# Patient Record
Sex: Female | Born: 2000
Health system: Southern US, Community
[De-identification: ages and names within clinical notes are randomized; demographics above are authoritative.]

## PROBLEM LIST (undated history)

## (undated) DIAGNOSIS — F32A Depression, unspecified: Secondary | ICD-10-CM

## (undated) DIAGNOSIS — M5126 Other intervertebral disc displacement, lumbar region: Secondary | ICD-10-CM

## (undated) DIAGNOSIS — J45909 Unspecified asthma, uncomplicated: Secondary | ICD-10-CM

## (undated) DIAGNOSIS — F329 Major depressive disorder, single episode, unspecified: Secondary | ICD-10-CM

## (undated) DIAGNOSIS — F209 Schizophrenia, unspecified: Secondary | ICD-10-CM

## (undated) DIAGNOSIS — A35 Other tetanus: Secondary | ICD-10-CM

## (undated) HISTORY — DX: Other tetanus: A35

## (undated) HISTORY — DX: Major depressive disorder, single episode, unspecified: F32.9

## (undated) HISTORY — DX: Depression, unspecified: F32.A

---
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
## 2018-02-05 ENCOUNTER — Encounter: Payer: Self-pay | Admitting: Emergency Medicine

## 2018-02-05 ENCOUNTER — Other Ambulatory Visit: Payer: Self-pay

## 2018-02-05 ENCOUNTER — Emergency Department
Admission: EM | Admit: 2018-02-05 | Discharge: 2018-02-05 | Disposition: A | Discharge status: Home / Self Care | Payer: BLUE CROSS/BLUE SHIELD | Attending: Emergency Medicine | Admitting: Emergency Medicine

## 2018-02-05 DIAGNOSIS — M5442 Lumbago with sciatica, left side: Secondary | ICD-10-CM | POA: Diagnosis not present

## 2018-02-05 DIAGNOSIS — M545 Low back pain: Secondary | ICD-10-CM | POA: Diagnosis not present

## 2018-02-05 DIAGNOSIS — J45909 Unspecified asthma, uncomplicated: Secondary | ICD-10-CM | POA: Diagnosis not present

## 2018-02-05 DIAGNOSIS — M5440 Lumbago with sciatica, unspecified side: Secondary | ICD-10-CM | POA: Diagnosis not present

## 2018-02-05 DIAGNOSIS — M5441 Lumbago with sciatica, right side: Secondary | ICD-10-CM | POA: Diagnosis not present

## 2018-02-05 HISTORY — DX: Other intervertebral disc displacement, lumbar region: M51.26

## 2018-02-05 LAB — URINALYSIS, COMPLETE (UACMP) WITH MICROSCOPIC
Bilirubin Urine: NEGATIVE
Glucose, UA: NEGATIVE mg/dL
Hgb urine dipstick: NEGATIVE
Ketones, ur: NEGATIVE mg/dL
Nitrite: NEGATIVE
Protein, ur: NEGATIVE mg/dL
Specific Gravity, Urine: 1.025 (ref 1.005–1.030)
pH: 6 (ref 5.0–8.0)

## 2018-02-05 MED ORDER — CYCLOBENZAPRINE HCL 10 MG PO TABS
5.0000 mg | ORAL_TABLET | Freq: Once | ORAL | Status: AC
  Administered 2018-02-05: 5 mg via ORAL
  Filled 2018-02-05: qty 1

## 2018-02-05 MED ORDER — PREDNISONE 50 MG PO TABS: ORAL_TABLET | ORAL | 0 refills | Status: DC

## 2018-02-05 MED ORDER — KETOROLAC TROMETHAMINE 30 MG/ML IJ SOLN
30.0000 mg | Freq: Once | INTRAMUSCULAR | Status: AC
  Administered 2018-02-05: 30 mg via INTRAMUSCULAR
  Filled 2018-02-05: qty 1

## 2018-02-05 MED ORDER — CYCLOBENZAPRINE HCL 5 MG PO TABS: 5.0000 mg | ORAL_TABLET | Freq: Three times a day (TID) | ORAL | 0 refills | Status: AC | PRN

## 2018-02-05 NOTE — ED Triage Notes (Addendum)
Pt presents to ED with lower back pain that has worsened over the past several weeks. Worse with movement and lifting. Hx of the same. Herniated L4 and L5. Ambulatory with steady gait. No distress noted.

## 2018-02-05 NOTE — ED Notes (Signed)

## 2018-02-05 NOTE — ED Provider Notes (Signed)
[facility] Emergency Department Provider Note  ____________________________________________  Time seen: Approximately 10:42 PM  I have reviewed the triage vital signs and the nursing notes.   HISTORY  Chief Complaint Back Pain    HPI Jill Osborne is a 17 y.o. female presents to the emergency department with bilateral low back pain that radiates into the buttocks but not the lower extremities.  Patient reports that she has experienced symptoms for the past 2 weeks.  She denies dysuria, hematuria or increased urinary frequency.  No fever or chills.  Patient reports that she is currently menstruating and denies the possibility of pregnancy.  She denies falls or mechanisms of trauma.  Patient does report that she has been carrying a backpack which is heavier than usual.  Patient reports a history of a herniated disc diagnosed approximately 6 years ago at L4-L5.  No weakness, radiculopathy or changes in sensation of the lower extremities.  No saddle anesthesia or bowel or bladder incontinence.  No alleviating measures have been attempted.  Past Medical History:  Diagnosis Date  . Asthma   . Lumbar herniated disc     Patient Active Problem List   Diagnosis Date Noted  . H/O appendicitis 07/15/2016  . Acute appendicitis with localized peritonitis     Past Surgical History:  Procedure Laterality Date  . LAPAROSCOPIC APPENDECTOMY N/A 07/15/2016   Procedure: APPENDECTOMY LAPAROSCOPIC;  Surgeon: Ricarda Frameharles Woodham, MD;  Location: [facility] ORS;  Service: General;  Laterality: N/A;    Prior to Admission medications   Medication Sig Start Date End Date Taking? Authorizing Provider  albuterol (PROVENTIL HFA;VENTOLIN HFA) 108 (90 Base) MCG/ACT inhaler Inhale 2 puffs into the lungs every 4 (four) hours as needed for wheezing or shortness of breath.    [provider]  cyclobenzaprine (FLEXERIL) 5 MG tablet Take 1 tablet (5 mg total) by mouth 3 (three) times daily as  needed for up to 3 days for muscle spasms. 02/05/18 02/08/18  Orvil FeilWoods, Shiara Mcgough M, PA-C  predniSONE (DELTASONE) 50 MG tablet Take one 50 mg tablet once daily for the next 5 days. 02/05/18   Orvil FeilWoods, Kirsten Spearing M, PA-C    Allergies Shellfish allergy  Family History  Problem Relation Age of Onset  . Healthy Mother   . Healthy Father     Social History Social History   Tobacco Use  . Smoking status: Never Smoker  . Smokeless tobacco: Never Used  Substance Use Topics  . Alcohol use: No  . Drug use: No     Review of Systems  Constitutional: No fever/chills Eyes: No visual changes. No discharge ENT: No upper respiratory complaints. Cardiovascular: no chest pain. Respiratory: no cough. No SOB. Gastrointestinal: No abdominal pain.  No nausea, no vomiting.  No diarrhea.  No constipation. Genitourinary: Negative for dysuria. No hematuria Musculoskeletal: Patient has low back pain.  Skin: Negative for rash, abrasions, lacerations, ecchymosis. Neurological: Negative for headaches, focal weakness or numbness.  ____________________________________________   PHYSICAL EXAM:  VITAL SIGNS: ED Triage Vitals  Enc Vitals Group     BP 02/05/18 1948 111/67     Pulse Rate 02/05/18 1948 62     Resp 02/05/18 1948 18     Temp 02/05/18 1948 98.8 F (37.1 C)     Temp Source 02/05/18 1948 Oral     SpO2 02/05/18 1948 100 %     Weight 02/05/18 1949 145 lb (65.8 kg)     Height 02/05/18 1949 5' (1.524 m)     Head  Circumference --      Peak Flow --      Pain Score 02/05/18 1949 5     Pain Loc --      Pain Edu? --      Excl. in GC? --      Constitutional: Alert and oriented. Well appearing and in no acute distress. Eyes: Conjunctivae are normal. PERRL. EOMI. Head: Atraumatic. Cardiovascular: Normal rate, regular rhythm. Normal S1 and S2.  Good peripheral circulation. Respiratory: Normal respiratory effort without tachypnea or retractions. Lungs CTAB. Good air entry to the bases with no decreased or  absent breath sounds. Gastrointestinal: Bowel sounds 4 quadrants. Soft and nontender to palpation. No guarding or rigidity. No palpable masses. No distention. No CVA tenderness. Musculoskeletal: Full range of motion to all extremities. No gross deformities appreciated.  Patient has paraspinal muscle tenderness along the lumbar spine.  Negative straight leg raise bilaterally. Neurologic:  Normal speech and language. No gross focal neurologic deficits are appreciated.  Skin:  Skin is warm, dry and intact. No rash noted. Psychiatric: Mood and affect are normal. Speech and behavior are normal. Patient exhibits appropriate insight and judgement.   ____________________________________________   LABS (all labs ordered are listed, but only abnormal results are displayed)  Labs Reviewed  URINALYSIS, COMPLETE (UACMP) WITH MICROSCOPIC - Abnormal; Notable for the following components:      Result Value   Color, Urine YELLOW (*)    APPearance CLOUDY (*)    Leukocytes, UA TRACE (*)    Bacteria, UA RARE (*)    Squamous Epithelial / LPF 6-30 (*)    All other components within normal limits   ____________________________________________  EKG   ____________________________________________  RADIOLOGY   No results found.  ____________________________________________    PROCEDURES  Procedure(s) performed:    Procedures    Medications  ketorolac (TORADOL) 30 MG/ML injection 30 mg (30 mg Intramuscular Given 02/05/18 2128)  cyclobenzaprine (FLEXERIL) tablet 5 mg (5 mg Oral Given 02/05/18 2130)     ____________________________________________   INITIAL IMPRESSION / ASSESSMENT AND PLAN / ED COURSE  Pertinent labs & imaging results that were available during my care of the patient were reviewed by me and considered in my medical decision making (see chart for details).  Review of the [place] CSRS was performed in accordance of the [facility] prior to dispensing any controlled drugs.      Assessment and plan Low back pain Patient presents to the emergency department with low back pain for the past 2 weeks.  Differential diagnosis included cystitis, lumbar strain and sciatica.  Patient was treated empirically with prednisone and Flexeril given radiation of pain into the buttocks and failure of anti-inflammatories.  Patient was advised to try a [place] backpack.  Vital signs are reassuring prior to discharge.  All patient questions were answered.    ____________________________________________  FINAL CLINICAL IMPRESSION(S) / ED DIAGNOSES  Final diagnoses:  Acute bilateral low back pain with sciatica, sciatica laterality unspecified      NEW MEDICATIONS STARTED DURING THIS VISIT:  ED Discharge Orders        Ordered    predniSONE (DELTASONE) 50 MG tablet     02/05/18 2119    cyclobenzaprine (FLEXERIL) 5 MG tablet  3 times daily PRN     02/05/18 2121          This chart was dictated using voice recognition software/Dragon. Despite best efforts to proofread, errors can occur which can change the meaning. Any change was purely unintentional.  Pia Mau [place], PA-C 02/05/18 2247    Dionne Bucy, MD 02/05/18 2322

## 2018-02-05 NOTE — ED Notes (Signed)
Pt reports upper and lower back pain that started 2 weeks ago however has increased since that time. Pt denies inj or trauma to back. Pt states hx of L4 and L5 back herniation. Pt states when she turns the pain gets worse. Pt denies loss of bladder or bowel funx. Pt ambulatory to room.

## 2018-03-07 ENCOUNTER — Encounter: Payer: Self-pay | Admitting: Primary Care

## 2018-03-07 ENCOUNTER — Ambulatory Visit: Payer: BLUE CROSS/BLUE SHIELD | Admitting: Primary Care

## 2018-03-07 VITALS — BP 108/66 | HR 73 | Temp 98.1°F | Ht 60.75 in | Wt 140.8 lb

## 2018-03-07 DIAGNOSIS — Z0000 Encounter for general adult medical examination without abnormal findings: Secondary | ICD-10-CM | POA: Diagnosis not present

## 2018-03-07 NOTE — Patient Instructions (Signed)
Start exercising. You should be getting 150 minutes of moderate intensity exercise weekly.  It's important to improve your diet by reducing consumption of fast food, fried food, processed snack foods, sugary drinks. Increase consumption of fresh vegetables and fruits, whole grains, water.  Ensure you are drinking 64 ounces of water daily.  Follow up in 1 year for your annual exam or sooner if needed.  It was a pleasure to meet you today! Please don't hesitate to call or message me with any questions. Welcome to [facility]!   Preventive Care 18-39 Years, Female Preventive care refers to lifestyle choices and visits with your health care provider that can promote health and wellness. What does preventive care include?  A yearly physical exam. This is also called an annual well check.  Dental exams once or twice a year.  Routine eye exams. Ask your health care provider how often you should have your eyes checked.  Personal lifestyle choices, including: ? Daily care of your teeth and gums. ? Regular physical activity. ? Eating a healthy diet. ? Avoiding tobacco and drug use. ? Limiting alcohol use. ? Practicing safe sex. ? Taking vitamin and mineral supplements as recommended by your health care provider. What happens during an annual well check? The services and screenings done by your health care provider during your annual well check will depend on your age, overall health, lifestyle risk factors, and family history of disease. Counseling Your health care provider may ask you questions about your:  Alcohol use.  Tobacco use.  Drug use.  Emotional well-being.  Home and relationship well-being.  Sexual activity.  Eating habits.  Work and work Statistician.  Method of birth control.  Menstrual cycle.  Pregnancy history.  Screening You may have the following tests or measurements:  Height, weight, and BMI.  Diabetes screening. This is done by checking your blood  sugar (glucose) after you have not eaten for a while (fasting).  Blood pressure.  Lipid and cholesterol levels. These may be checked every 5 years starting at age 54.  Skin check.  Hepatitis C blood test.  Hepatitis B blood test.  Sexually transmitted disease (STD) testing.  BRCA-related cancer screening. This may be done if you have a family history of breast, ovarian, tubal, or peritoneal cancers.  Pelvic exam and Pap test. This may be done every 3 years starting at age 75. Starting at age 74, this may be done every 5 years if you have a Pap test in combination with an HPV test.  Discuss your test results, treatment options, and if necessary, the need for more tests with your health care provider. Vaccines Your health care provider may recommend certain vaccines, such as:  Influenza vaccine. This is recommended every year.  Tetanus, diphtheria, and acellular pertussis (Tdap, Td) vaccine. You may need a Td booster every 10 years.  Varicella vaccine. You may need this if you have not been vaccinated.  HPV vaccine. If you are 77 or younger, you may need three doses over 6 months.  Measles, mumps, and rubella (MMR) vaccine. You may need at least one dose of MMR. You may also need a second dose.  Pneumococcal 13-valent conjugate (PCV13) vaccine. You may need this if you have certain conditions and were not previously vaccinated.  Pneumococcal polysaccharide (PPSV23) vaccine. You may need one or two doses if you smoke cigarettes or if you have certain conditions.  Meningococcal vaccine. One dose is recommended if you are age 61-21 years and a first-year college  student living in a residence hall, or if you have one of several medical conditions. You may also need additional booster doses.  Hepatitis A vaccine. You may need this if you have certain conditions or if you travel or work in places where you may be exposed to hepatitis A.  Hepatitis B vaccine. You may need this if you  have certain conditions or if you travel or work in places where you may be exposed to hepatitis B.  Haemophilus influenzae type b (Hib) vaccine. You may need this if you have certain risk factors.  Talk to your health care provider about which screenings and vaccines you need and how often you need them. This information is not intended to replace advice given to you by your health care provider. Make sure you discuss any questions you have with your health care provider. Document Released: 11/29/2001 Document Revised: 06/22/2016 Document Reviewed: 08/04/2015 Elsevier Interactive Patient Education  [employer].

## 2018-03-07 NOTE — Progress Notes (Signed)
Subjective:    Patient ID: Jill Osborne, female    DOB: Apr 01, 2001, 17 y.o.   MRN: [id number]  HPI  Jill Osborne is an 17 year old female who presents today to establish care and for complete physical.   1) Depression: History of in the past and was seeing a therapist for several years. She denies concerns of depression since 2017.   Immunizations: -Tetanus: Completed in 2012 -Influenza: Did not complete last season -HPV: Completed three series     Diet: She endorses a fair diet. Breakfast: Egg sandwich, fruit Lunch: Left overs  Dinner: Vegetables, meat, starch Snacks: Crackers, cheese Desserts: None Beverages: Water, juice  Exercise: She is not exercising  Eye exam: Completed in 2019 Dental exam: Completes annually   Review of Systems  Constitutional: Negative for unexpected weight change.  HENT: Negative for rhinorrhea.   Respiratory: Negative for cough and shortness of breath.   Cardiovascular: Negative for chest pain.  Gastrointestinal: Negative for constipation and diarrhea.  Genitourinary: Negative for difficulty urinating and menstrual problem.  Musculoskeletal: Negative for arthralgias and myalgias.  Skin: Negative for rash.  Allergic/Immunologic: Negative for environmental allergies.  Neurological: Negative for dizziness, numbness and headaches.  Psychiatric/Behavioral: The patient is not nervous/anxious.        Denies concerns for anxiety and depression       Past Medical History:  Diagnosis Date  . Asthma   . Depression   . Lumbar herniated disc      Social History   Socioeconomic History  . Marital status: Single    Spouse name: Not on file  . Number of children: Not on file  . Years of education: Not on file  . Highest education level: Not on file  Occupational History  . Not on file  Social Needs  . Financial resource strain: Not on file  . Food insecurity:    Worry: Not on file    Inability: Not on file  . Transportation needs:   Medical: Not on file    Non-medical: Not on file  Tobacco Use  . Smoking status: Never Smoker  . Smokeless tobacco: Never Used  Substance and Sexual Activity  . Alcohol use: No  . Drug use: No  . Sexual activity: Not on file  Lifestyle  . Physical activity:    Days per week: Not on file    Minutes per session: Not on file  . Stress: Not on file  Relationships  . Social connections:    Talks on phone: Not on file    Gets together: Not on file    Attends religious service: Not on file    Active member of club or organization: Not on file    Attends meetings of clubs or organizations: Not on file    Relationship status: Not on file  . Intimate partner violence:    Fear of current or ex partner: Not on file    Emotionally abused: Not on file    Physically abused: Not on file    Forced sexual activity: Not on file  Other Topics Concern  . Not on file  Social History Narrative   Single.   Student at [employer], [employer].   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   Enjoys reading, spending time with friends.    Past Surgical History:  Procedure Laterality Date  . LAPAROSCOPIC APPENDECTOMY N/A 07/15/2016   Procedure: APPENDECTOMY LAPAROSCOPIC;  Surgeon: Ricarda Frame, MD;  Location: [facility] ORS;  Service: General;  Laterality: N/A;  Family History  Problem Relation Age of Onset  . Healthy Mother   . Healthy Father     Allergies  Allergen Reactions  . Shellfish Allergy Hives    No current outpatient medications on file prior to visit.   No current facility-administered medications on file prior to visit.     BP 108/66   Pulse 73   Temp 98.1 F (36.7 C) (Oral)   Ht 5' 0.75" (1.543 m)   Wt 140 lb 12 oz (63.8 kg)   LMP 02/06/2018   SpO2 98%   BMI 26.81 kg/m    Objective:   Physical Exam  Constitutional: She is oriented to person, place, and time. She appears well-nourished.  HENT:  Right Ear: Tympanic membrane and ear canal normal.  Left Ear: Tympanic membrane and  ear canal normal.  Nose: Nose normal.  Mouth/Throat: Oropharynx is clear and moist.  Eyes: Pupils are equal, round, and reactive to light. Conjunctivae and EOM are normal.  Neck: Neck supple. No thyromegaly present.  Cardiovascular: Normal rate and regular rhythm.  No murmur heard. Pulmonary/Chest: Effort normal and breath sounds normal. She has no rales.  Abdominal: Soft. Bowel sounds are normal. There is no tenderness.  Musculoskeletal: Normal range of motion.  Lymphadenopathy:    She has no cervical adenopathy.  Neurological: She is alert and oriented to person, place, and time. She has normal reflexes. No cranial nerve deficit.  Skin: Skin is warm and dry. No rash noted.  Psychiatric: She has a normal mood and affect.          Assessment & Plan:

## 2018-03-07 NOTE — Assessment & Plan Note (Signed)
Immunizations UTD. Recommended regular exercise, increase consumption of vegetables, fruit, whole grains, water. Exam unremarkable. Declines lab work today, this is fine. Follow up in 1 year for CPE.

## 2018-04-09 ENCOUNTER — Encounter: Payer: Self-pay | Admitting: Internal Medicine

## 2018-04-09 ENCOUNTER — Ambulatory Visit (INDEPENDENT_AMBULATORY_CARE_PROVIDER_SITE_OTHER): Payer: BLUE CROSS/BLUE SHIELD | Admitting: Internal Medicine

## 2018-04-09 VITALS — BP 106/70 | HR 72 | Temp 98.3°F | Wt 143.0 lb

## 2018-04-09 DIAGNOSIS — G5601 Carpal tunnel syndrome, right upper limb: Secondary | ICD-10-CM

## 2018-04-09 MED ORDER — NAPROXEN 375 MG PO TABS: 375.0000 mg | ORAL_TABLET | Freq: Two times a day (BID) | ORAL | 0 refills | Status: DC | PRN

## 2018-04-09 NOTE — Patient Instructions (Signed)
Carpal Tunnel Syndrome Carpal tunnel syndrome is a condition that causes pain in your hand and arm. The carpal tunnel is a narrow area that is on the palm side of your wrist. Repeated wrist motion or certain diseases may cause swelling in the tunnel. This swelling can pinch the main nerve in the wrist (median nerve). Follow these instructions at home: If you have a splint:  Wear it as told by your doctor. Remove it only as told by your doctor.  Loosen the splint if your fingers: ? Become numb and tingle. ? Turn blue and cold.  Keep the splint clean and dry. General instructions  Take over-the-counter and prescription medicines only as told by your doctor.  Rest your wrist from any activity that may be causing your pain. If needed, talk to your employer about changes that can be made in your work, such as getting a wrist pad to use while typing.  If directed, apply ice to the painful area: ? Put ice in a plastic bag. ? Place a towel between your skin and the bag. ? Leave the ice on for 20 minutes, 2-3 times per day.  Keep all follow-up visits as told by your doctor. This is important.  Do any exercises as told by your doctor, physical therapist, or occupational therapist. Contact a doctor if:  You have new symptoms.  Medicine does not help your pain.  Your symptoms get worse. This information is not intended to replace advice given to you by your health care provider. Make sure you discuss any questions you have with your health care provider. Document Released: 09/22/2011 Document Revised: 03/10/2016 Document Reviewed: 02/18/2015 Elsevier Interactive Patient Education  2018 Elsevier Inc.  

## 2018-04-09 NOTE — Progress Notes (Signed)
Subjective:    Patient ID: Jill Osborne, female    DOB: [birth year], 17 y.o.   MRN: [id number]  HPI  Pt presents to the clinic today with c/o right wrist and hand pain. She reports this started 3 weeks ago. She describes the pain as dull and achy. She has some numbness in her right thumb and index finger, but denies tingling or weakness. She denies any injury to the area but reports she is constantly cutting boxes open with a box cutter at work. She has tried ice, Tylenol and Ibuprofen with some relief.   Review of Systems      Past Medical History:  Diagnosis Date  . Asthma   . Depression   . Lumbar herniated disc     No current outpatient medications on file.   No current facility-administered medications for this visit.     Allergies  Allergen Reactions  . Shellfish Allergy Hives    Family History  Problem Relation Age of Onset  . Healthy Mother   . Healthy Father     Social History   Socioeconomic History  . Marital status: Single    Spouse name: Not on file  . Number of children: Not on file  . Years of education: Not on file  . Highest education level: Not on file  Occupational History  . Not on file  Social Needs  . Financial resource strain: Not on file  . Food insecurity:    Worry: Not on file    Inability: Not on file  . Transportation needs:    Medical: Not on file    Non-medical: Not on file  Tobacco Use  . Smoking status: Never Smoker  . Smokeless tobacco: Never Used  Substance and Sexual Activity  . Alcohol use: No  . Drug use: No  . Sexual activity: Not on file  Lifestyle  . Physical activity:    Days per week: Not on file    Minutes per session: Not on file  . Stress: Not on file  Relationships  . Social connections:    Talks on phone: Not on file    Gets together: Not on file    Attends religious service: Not on file    Active member of club or organization: Not on file    Attends meetings of clubs or organizations: Not on  file    Relationship status: Not on file  . Intimate partner violence:    Fear of current or ex partner: Not on file    Emotionally abused: Not on file    Physically abused: Not on file    Forced sexual activity: Not on file  Other Topics Concern  . Not on file  Social History Narrative   Single.   Student [employer].   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   Enjoys reading, spending time with friends.     Constitutional: Denies fever, malaise, fatigue, headache or abrupt weight changes.  Musculoskeletal: Pt reports right wrist and hand pain. Denies decrease in range of motion, difficulty with gait, muscle pain or joint swelling.  Skin: Denies redness, rashes, lesions or ulcercations.  Neurological: Pt reports numbness in right thumb. Denies dizziness, difficulty with memory, difficulty with speech or problems with balance and coordination.    No other specific complaints in a complete review of systems (except as listed in HPI above).  Objective:   Physical Exam  Wt 143 lb (64.9 kg)   LMP 03/14/2018   BMI  27.24 kg/m  Wt Readings from Last 3 Encounters:  04/09/18 143 lb (64.9 kg) (76 %, Z= 0.70)*  03/07/18 140 lb 12 oz (63.8 kg) (74 %, Z= 0.63)*  02/05/18 145 lb (65.8 kg) (78 %, Z= 0.79)*   * Growth percentiles are based on CDC (Girls, 2-20 Years) data.    General: Appears her stated age, well developed, well nourished in NAD. Skin: Warm, dry and intact. No rashes, lesions or ulcerations noted. Cardiovascular: Radial pulse 2+ bilaterally. Cap refill < 3 secs. Musculoskeletal: Normal flexion, extension and rotation of the right wrist. No joint swelling noted. No pain with palpation over the carpals. Pain with palpation over the right CMC. Hand grips equal. Neurological: Alert and oriented. Sensation intact to right thumb and index finger. Positive Tinel's and Phalen's.   BMET    Component Value Date/Time   NA 140 08/14/2017 2021   K 3.7 08/14/2017 2021   CL 109  08/14/2017 2021   CO2 24 08/14/2017 2021   GLUCOSE 112 (H) 08/14/2017 2021   BUN 8 08/14/2017 2021   CREATININE 0.70 08/14/2017 2021   CALCIUM 9.6 08/14/2017 2021   GFRNONAA >60 08/14/2017 2021   GFRAA >60 08/14/2017 2021    Lipid Panel  No results found for: CHOL, TRIG, HDL, CHOLHDL, VLDL, LDLCALC  CBC    Component Value Date/Time   WBC 6.4 08/14/2017 2021   RBC 3.93 08/14/2017 2021   HGB 11.7 (L) 08/14/2017 2021   HCT 35.6 (L) 08/14/2017 2021   PLT 289 08/14/2017 2021   MCV 90.6 08/14/2017 2021   MCH 29.8 08/14/2017 2021   MCHC 32.9 08/14/2017 2021   RDW 12.7 08/14/2017 2021    Hgb A1C No results found for: HGBA1C          Assessment & Plan:   Carpal Tunnel Syndrome:  eRx for Naproxen 375 mg BID prn- take with food, avoid other NSAID use Advised her to get a cock up splint to wear while working If no improvement, consider referral to neurology for EMG testing vs referral to hand surgery for surgical evaluation  Return precautions discussed Nicki Reaper, NP

## 2018-04-10 ENCOUNTER — Ambulatory Visit (INDEPENDENT_AMBULATORY_CARE_PROVIDER_SITE_OTHER): Payer: BLUE CROSS/BLUE SHIELD | Admitting: Family Medicine

## 2018-04-10 ENCOUNTER — Encounter: Payer: Self-pay | Admitting: Family Medicine

## 2018-04-10 VITALS — BP 106/67 | HR 75 | Ht 60.0 in | Wt 143.4 lb

## 2018-04-10 DIAGNOSIS — Z113 Encounter for screening for infections with a predominantly sexual mode of transmission: Secondary | ICD-10-CM | POA: Diagnosis not present

## 2018-04-10 DIAGNOSIS — Z3009 Encounter for other general counseling and advice on contraception: Secondary | ICD-10-CM | POA: Diagnosis not present

## 2018-04-10 DIAGNOSIS — Z01419 Encounter for gynecological examination (general) (routine) without abnormal findings: Secondary | ICD-10-CM

## 2018-04-10 NOTE — Patient Instructions (Addendum)
Preventive Care 18-39 Years, Female Preventive care refers to lifestyle choices and visits with your health care provider that can promote health and wellness. What does preventive care include?  A yearly physical exam. This is also called an annual well check.  Dental exams once or twice a year.  Routine eye exams. Ask your health care provider how often you should have your eyes checked.  Personal lifestyle choices, including: ? Daily care of your teeth and gums. ? Regular physical activity. ? Eating a healthy diet. ? Avoiding tobacco and drug use. ? Limiting alcohol use. ? Practicing safe sex. ? Taking vitamin and mineral supplements as recommended by your health care provider. What happens during an annual well check? The services and screenings done by your health care provider during your annual well check will depend on your age, overall health, lifestyle risk factors, and family history of disease. Counseling Your health care provider may ask you questions about your:  Alcohol use.  Tobacco use.  Drug use.  Emotional well-being.  Home and relationship well-being.  Sexual activity.  Eating habits.  Work and work Statistician.  Method of birth control.  Menstrual cycle.  Pregnancy history.  Screening You may have the following tests or measurements:  Height, weight, and BMI.  Diabetes screening. This is done by checking your blood sugar (glucose) after you have not eaten for a while (fasting).  Blood pressure.  Lipid and cholesterol levels. These may be checked every 5 years starting at age 38.  Skin check.  Hepatitis C blood test.  Hepatitis B blood test.  Sexually transmitted disease (STD) testing.  BRCA-related cancer screening. This may be done if you have a family history of breast, ovarian, tubal, or peritoneal cancers.  Pelvic exam and Pap test. This may be done every 3 years starting at age 38. Starting at age 30, this may be done  every 5 years if you have a Pap test in combination with an HPV test.  Discuss your test results, treatment options, and if necessary, the need for more tests with your health care provider. Vaccines Your health care provider may recommend certain vaccines, such as:  Influenza vaccine. This is recommended every year.  Tetanus, diphtheria, and acellular pertussis (Tdap, Td) vaccine. You may need a Td booster every 10 years.  Varicella vaccine. You may need this if you have not been vaccinated.  HPV vaccine. If you are 39 or younger, you may need three doses over 6 months.  Measles, mumps, and rubella (MMR) vaccine. You may need at least one dose of MMR. You may also need a second dose.  Pneumococcal 13-valent conjugate (PCV13) vaccine. You may need this if you have certain conditions and were not previously vaccinated.  Pneumococcal polysaccharide (PPSV23) vaccine. You may need one or two doses if you smoke cigarettes or if you have certain conditions.  Meningococcal vaccine. One dose is recommended if you are age 68-21 years and a first-year college student living in a residence hall, or if you have one of several medical conditions. You may also need additional booster doses.  Hepatitis A vaccine. You may need this if you have certain conditions or if you travel or work in places where you may be exposed to hepatitis A.  Hepatitis B vaccine. You may need this if you have certain conditions or if you travel or work in places where you may be exposed to hepatitis B.  Haemophilus influenzae type b (Hib) vaccine. You may need this  if you have certain risk factors.  Talk to your health care provider about which screenings and vaccines you need and how often you need them. This information is not intended to replace advice given to you by your health care provider. Make sure you discuss any questions you have with your health care provider. Document Released: 11/29/2001 Document Revised:  06/22/2016 Document Reviewed: 08/04/2015 Elsevier Interactive Patient Education  2018 [employer].  Intrauterine Device Information An intrauterine device (IUD) is inserted into your uterus to prevent pregnancy. There are two types of IUDs available:  Copper IUD-This type of IUD is wrapped in copper wire and is placed inside the uterus. Copper makes the uterus and fallopian tubes produce a fluid that kills sperm. The copper IUD can stay in place for 10 years.  Hormone IUD-This type of IUD contains the hormone progestin (synthetic progesterone). The hormone thickens the cervical mucus and prevents sperm from entering the uterus. It also thins the uterine lining to prevent implantation of a fertilized egg. The hormone can weaken or kill the sperm that get into the uterus. One type of hormone IUD can stay in place for 5 years, and another type can stay in place for 3 years.  Your health care provider will make sure you are a good candidate for a contraceptive IUD. Discuss with your health care provider the possible side effects. Advantages of an intrauterine device  IUDs are highly effective, reversible, long acting, and low maintenance.  There are no estrogen-related side effects.  An IUD can be used when breastfeeding.  IUDs are not associated with weight gain.  The copper IUD works immediately after insertion.  The hormone IUD works right away if inserted within 7 days of your period starting. You will need to use a backup method of birth control for 7 days if the hormone IUD is inserted at any other time in your cycle.  The copper IUD does not interfere with your female hormones.  The hormone IUD can make heavy menstrual periods lighter and decrease cramping.  The hormone IUD can be used for 3 or 5 years.  The copper IUD can be used for 10 years. Disadvantages of an intrauterine device  The hormone IUD can be associated with irregular bleeding patterns.  The copper IUD can make  your menstrual flow heavier and more painful.  You may experience cramping and vaginal bleeding after insertion. This information is not intended to replace advice given to you by your health care provider. Make sure you discuss any questions you have with your health care provider. Document Released: 09/06/2004 Document Revised: 03/10/2016 Document Reviewed: 03/24/2013 Elsevier Interactive Patient Education  2017 [employer].

## 2018-04-10 NOTE — Progress Notes (Signed)
Pt presents for AEX. Pt would like to discuss birth control options.

## 2018-04-10 NOTE — Progress Notes (Signed)
  Subjective:     Jill Osborne is a 17 y.o. female and is here for a comprehensive physical exam. The patient reports no problems. LMP 5/29, regular cycles. Using condoms for contraception.  Past Medical History:  Diagnosis Date  . Asthma   . Depression   . Lumbar herniated disc    Past Surgical History:  Procedure Laterality Date  . LAPAROSCOPIC APPENDECTOMY N/A 07/15/2016   Procedure: APPENDECTOMY LAPAROSCOPIC;  Surgeon: Ricarda Frameharles Woodham, MD;  Location: [facility] ORS;  Service: General;  Laterality: N/A;   Allergies  Allergen Reactions  . Shellfish Allergy Hives    Health Maintenance  Topic Date Due  . CHLAMYDIA SCREENING  05/28/2014  . HIV Screening  05/28/2014  . INFLUENZA VACCINE  05/17/2018    The following portions of the patient's history were reviewed and updated as appropriate: allergies, current medications, past family history, past medical history, past social history, past surgical history and problem list.  Review of Systems Pertinent items noted in HPI and remainder of comprehensive ROS otherwise negative.   Objective:    BP 106/67   Pulse 75   Ht 5' (1.524 m)   Wt 143 lb 6.4 oz (65 kg)   LMP 03/14/2018   BMI 28.01 kg/m  General appearance: alert, cooperative and appears stated age Head: Normocephalic, without obvious abnormality, atraumatic Neck: supple, symmetrical, trachea midline Lungs: clear to auscultation bilaterally Heart: regular rate and rhythm, S1, S2 normal, no murmur, click, rub or gallop Abdomen: soft, non-tender; bowel sounds normal; no masses,  no organomegaly Extremities: extremities normal, atraumatic, no cyanosis or edema Skin: Skin color, texture, turgor normal. No rashes or lesions Neurologic: Grossly normal    Assessment:    Healthy female exam.      Plan:     Screen for STD (sexually transmitted disease) - Plan: Cervicovaginal ancillary only  Encounter for counseling regarding contraception - decided for IUD--consider  Kyleena vs. Liletta--check cultures today, return with menses, take Ibuprofen, Condoms always.  Return in 1 week (on 04/17/2018) for IUD insertion.  See After Visit Summary for Counseling Recommendations

## 2018-04-11 DIAGNOSIS — Z1281 Encounter for screening for malignant neoplasm of oral cavity: Secondary | ICD-10-CM | POA: Diagnosis not present

## 2018-04-11 DIAGNOSIS — K0500 Acute gingivitis, plaque induced: Secondary | ICD-10-CM | POA: Diagnosis not present

## 2018-04-11 DIAGNOSIS — K036 Deposits [accretions] on teeth: Secondary | ICD-10-CM | POA: Diagnosis not present

## 2018-04-11 LAB — CERVICOVAGINAL ANCILLARY ONLY
Chlamydia: NEGATIVE
NEISSERIA GONORRHEA: NEGATIVE

## 2018-04-26 ENCOUNTER — Ambulatory Visit (INDEPENDENT_AMBULATORY_CARE_PROVIDER_SITE_OTHER): Payer: BLUE CROSS/BLUE SHIELD | Admitting: Family Medicine

## 2018-04-26 ENCOUNTER — Encounter: Payer: Self-pay | Admitting: Family Medicine

## 2018-04-26 VITALS — BP 106/72 | HR 72 | Wt 144.2 lb

## 2018-04-26 DIAGNOSIS — Z3202 Encounter for pregnancy test, result negative: Secondary | ICD-10-CM

## 2018-04-26 DIAGNOSIS — Z30430 Encounter for insertion of intrauterine contraceptive device: Secondary | ICD-10-CM | POA: Diagnosis not present

## 2018-04-26 LAB — POCT URINE PREGNANCY: Preg Test, Ur: NEGATIVE

## 2018-04-26 MED ORDER — LEVONORGESTREL 19.5 MG IU IUD
1.0000 | INTRAUTERINE_SYSTEM | INTRAUTERINE | Status: DC
  Administered 2018-04-26: 19.5 mg via INTRAUTERINE

## 2018-04-26 NOTE — Patient Instructions (Signed)

## 2018-04-26 NOTE — Progress Notes (Signed)
   Subjective:    Patient ID: Jill AngstMariah E Dorian is a 17 y.o. female presenting with IUD INSERTION  on 04/26/2018  HPI: Here today for IUD insertion. She is without complaints. Negative STD testing 2 wks ago. She is on her cycle and took some Ibuprofen today. UPT is negative.  Review of Systems  Constitutional: Negative for chills and fever.  Respiratory: Negative for shortness of breath.   Cardiovascular: Negative for chest pain.  Gastrointestinal: Negative for abdominal pain, nausea and vomiting.  Genitourinary: Negative for dysuria.  Skin: Negative for rash.      Objective:    BP 106/72   Pulse 72   Wt 144 lb 3.2 oz (65.4 kg)   BMI 28.16 kg/m  Physical Exam  Constitutional: She is oriented to person, place, and time. She appears well-developed and well-nourished. No distress.  HENT:  Head: Normocephalic and atraumatic.  Eyes: No scleral icterus.  Neck: Neck supple.  Cardiovascular: Normal rate.  Pulmonary/Chest: Effort normal.  Abdominal: Soft.  Genitourinary: Vagina normal. Cervix exhibits no motion tenderness.  Neurological: She is alert and oriented to person, place, and time.  Skin: Skin is warm and dry.  Psychiatric: She has a normal mood and affect.   Procedure: Patient identified, informed consent performed, signed copy in chart, time out was performed.  Urine pregnancy test negative.  Speculum placed in the vagina.  Cervix visualized.  Cleaned with Betadine x 2.  Grasped anteriourly with a single tooth tenaculum.  Uterus sounded to 7 cm.  Kyleena IUD placed per manufacturer's recommendations.  Strings trimmed to 3 cm.   Patient given post procedure instructions and Kyleena care card with expiration date.  Patient is asked to check IUD strings periodically and follow up in 4-6 weeks for IUD check.      Assessment & Plan:  Encounter for IUD insertion - Kyleena placed - Plan: POCT urine pregnancy, Levonorgestrel IUD 19.5 mg  Return in about 1 month (around  05/24/2018) for iud check.  Reva Boresanya S Orpheus [place] 04/26/2018 10:02 AM

## 2018-05-06 ENCOUNTER — Other Ambulatory Visit: Payer: Self-pay | Admitting: Internal Medicine

## 2018-05-31 ENCOUNTER — Encounter: Payer: Self-pay | Admitting: Family Medicine

## 2018-05-31 ENCOUNTER — Ambulatory Visit (INDEPENDENT_AMBULATORY_CARE_PROVIDER_SITE_OTHER): Payer: BLUE CROSS/BLUE SHIELD | Admitting: Family Medicine

## 2018-05-31 VITALS — BP 105/70 | HR 76 | Resp 16 | Ht 60.0 in | Wt 147.6 lb

## 2018-05-31 DIAGNOSIS — Z30431 Encounter for routine checking of intrauterine contraceptive device: Secondary | ICD-10-CM

## 2018-05-31 NOTE — Progress Notes (Signed)
   Subjective:    Patient ID: Jill Osborne is a 17 y.o. female presenting with Follow-up (IUD string check)  on 05/31/2018  HPI: Here for IUD check. Left sided cramp a couple times/wk, lasts 20 seconds and then spontaneously resolves. IUD placed 4.5 wks ago. Has had some light bleeding.  Review of Systems  Constitutional: Negative for chills and fever.  Respiratory: Negative for shortness of breath.   Cardiovascular: Negative for chest pain.  Gastrointestinal: Negative for abdominal pain, nausea and vomiting.  Genitourinary: Negative for dysuria.  Skin: Negative for rash.      Objective:    BP 105/70 (BP Location: Left Arm, Patient Position: Sitting, Cuff Size: Normal)   Pulse 76   Resp 16   Ht 5' (1.524 m)   Wt 147 lb 9.6 oz (67 kg)   LMP 05/12/2018 (Approximate)   BMI 28.83 kg/m  Physical Exam  Constitutional: She is oriented to person, place, and time. She appears well-developed and well-nourished. No distress.  HENT:  Head: Normocephalic and atraumatic.  Eyes: No scleral icterus.  Neck: Neck supple.  Cardiovascular: Normal rate.  Pulmonary/Chest: Effort normal.  Abdominal: Soft.  Genitourinary:  Genitourinary Comments: IUD strings visible  Neurological: She is alert and oriented to person, place, and time.  Skin: Skin is warm and dry.  Psychiatric: She has a normal mood and affect.        Assessment & Plan:   IUD check up - in place--tolerating well.   Total face-to-face time with patient: 10 minutes. Over 50% of encounter was spent on counseling and coordination of care. Return in about 1 year (around 06/01/2019).  Reva Boresanya S Ramesh Moan 05/31/2018 9:18 AM

## 2018-07-25 ENCOUNTER — Emergency Department: Payer: BLUE CROSS/BLUE SHIELD

## 2018-07-25 ENCOUNTER — Encounter: Payer: Self-pay | Admitting: Emergency Medicine

## 2018-07-25 ENCOUNTER — Emergency Department
Admission: EM | Admit: 2018-07-25 | Discharge: 2018-07-25 | Disposition: A | Discharge status: Home / Self Care | Payer: BLUE CROSS/BLUE SHIELD | Attending: Emergency Medicine | Admitting: Emergency Medicine

## 2018-07-25 DIAGNOSIS — J45909 Unspecified asthma, uncomplicated: Secondary | ICD-10-CM | POA: Diagnosis not present

## 2018-07-25 DIAGNOSIS — X509XXA Other and unspecified overexertion or strenuous movements or postures, initial encounter: Secondary | ICD-10-CM | POA: Insufficient documentation

## 2018-07-25 DIAGNOSIS — Y998 Other external cause status: Secondary | ICD-10-CM | POA: Insufficient documentation

## 2018-07-25 DIAGNOSIS — Y9289 Other specified places as the place of occurrence of the external cause: Secondary | ICD-10-CM | POA: Insufficient documentation

## 2018-07-25 DIAGNOSIS — M7989 Other specified soft tissue disorders: Secondary | ICD-10-CM | POA: Diagnosis not present

## 2018-07-25 DIAGNOSIS — S99912A Unspecified injury of left ankle, initial encounter: Secondary | ICD-10-CM | POA: Diagnosis not present

## 2018-07-25 DIAGNOSIS — Y9389 Activity, other specified: Secondary | ICD-10-CM | POA: Diagnosis not present

## 2018-07-25 DIAGNOSIS — S93402A Sprain of unspecified ligament of left ankle, initial encounter: Secondary | ICD-10-CM | POA: Diagnosis not present

## 2018-07-25 DIAGNOSIS — R2242 Localized swelling, mass and lump, left lower limb: Secondary | ICD-10-CM | POA: Diagnosis not present

## 2018-07-25 MED ORDER — IBUPROFEN 600 MG PO TABS
600.0000 mg | ORAL_TABLET | Freq: Once | ORAL | Status: AC
  Administered 2018-07-25: 600 mg via ORAL
  Filled 2018-07-25: qty 1

## 2018-07-25 MED ORDER — NAPROXEN 375 MG PO TABS: 375.0000 mg | ORAL_TABLET | Freq: Two times a day (BID) | ORAL | 0 refills | Status: DC | PRN

## 2018-07-25 NOTE — ED Triage Notes (Signed)
First Nurse Note:  C/O left ankle injury x 1 day.  States injured ankle last night at gym.  AAOx3.  Skin warm and dry. NAD

## 2018-07-25 NOTE — Discharge Instructions (Addendum)
Follow-up with your primary care provider if any continued problems.  Wear ankle splint until able to bear weight without pain and use crutches for added support.  Ice and elevate your ankle as needed for swelling and pain.  Begin taking naproxen 375 twice daily with food for inflammation and pain.

## 2018-07-25 NOTE — ED Provider Notes (Signed)
[facility] Emergency Department Provider Note   ____________________________________________   First MD Initiated Contact with Patient 07/25/18 1138     (approximate)  I have reviewed the triage vital signs and the nursing notes.   HISTORY  Chief Complaint Ankle Injury   HPI Jill Osborne is a 17 y.o. female presents to the ED with complaint of left ankle pain.  Patient states that she stepped down from an elliptical machine last night at the gym when she twisted her ankle.  She is continued to have swelling and pain this morning.  She does not take any over-the-counter medication.  She denies any previous ankle injuries.  She rates her pain as 3 out of 10.  Past Medical History:  Diagnosis Date  . Asthma   . Depression   . Lumbar herniated disc     Patient Active Problem List   Diagnosis Date Noted  . Preventative health care 03/07/2018    Past Surgical History:  Procedure Laterality Date  . LAPAROSCOPIC APPENDECTOMY N/A 07/15/2016   Procedure: APPENDECTOMY LAPAROSCOPIC;  Surgeon: Ricarda Frame, MD;  Location: [facility] ORS;  Service: General;  Laterality: N/A;    Prior to Admission medications   Medication Sig Start Date End Date Taking? Authorizing Provider  naproxen (NAPROSYN) 375 MG tablet Take 1 tablet (375 mg total) by mouth 2 (two) times daily as needed. 07/25/18   Tommi Rumps, PA-C    Allergies Shellfish allergy  Family History  Problem Relation Age of Onset  . Healthy Mother   . Healthy Father     Social History Social History   Tobacco Use  . Smoking status: Never Smoker  . Smokeless tobacco: Never Used  Substance Use Topics  . Alcohol use: No  . Drug use: No    Review of Systems Constitutional: No fever/chills Cardiovascular: Denies chest pain. Respiratory: Denies shortness of breath. Musculoskeletal: Positive for left ankle pain. Skin: Negative for rash. Neurological: Negative for  focal weakness or  numbness. ____________________________________________   PHYSICAL EXAM:  VITAL SIGNS: ED Triage Vitals  Enc Vitals Group     BP 07/25/18 1134 110/66     Pulse Rate 07/25/18 1134 77     Resp 07/25/18 1134 16     Temp 07/25/18 1134 98.2 F (36.8 C)     Temp Source 07/25/18 1134 Oral     SpO2 07/25/18 1134 98 %     Weight 07/25/18 1135 146 lb (66.2 kg)     Height 07/25/18 1135 5' (1.524 m)     Head Circumference --      Peak Flow --      Pain Score 07/25/18 1135 3     Pain Loc --      Pain Edu? --      Excl. in GC? --    Constitutional: Alert and oriented. Well appearing and in no acute distress. Eyes: Conjunctivae are normal.  Head: Atraumatic. Neck: No stridor.   Cardiovascular: Normal rate, regular rhythm. Grossly normal heart sounds.  Good peripheral circulation. Respiratory: Normal respiratory effort.  No retractions. Lungs CTAB. Musculoskeletal: On examination of left ankle there is no gross deformity however there is moderate soft tissue swelling and tenderness noted on the lateral malleolus.  Range of motion is restricted secondary to patient's discomfort.  No ecchymosis or abrasions were seen.  Motor or sensory function intact distal to the injury.  Patient is able to move all digits without any difficulty. Neurologic:  Normal speech and language.  No gross focal neurologic deficits are appreciated. Skin:  Skin is warm, dry and intact. No rash noted. Psychiatric: Mood and affect are normal. Speech and behavior are normal.  ____________________________________________   LABS (all labs ordered are listed, but only abnormal results are displayed)  Labs Reviewed - No data to display  RADIOLOGY  ED MD interpretation:  Left ankle negative for acute bony injury.  Official radiology report(s): Dg Ankle Complete Left  Result Date: 07/25/2018 CLINICAL DATA:  Acute left ankle injury. EXAM: LEFT ANKLE COMPLETE - 3+ VIEW COMPARISON:  None. FINDINGS: There is no evidence of  fracture, dislocation, or joint effusion. There is no evidence of arthropathy or other focal bone abnormality. Soft tissue swelling is seen over lateral malleolus. IMPRESSION: No fracture or dislocation is noted. Soft tissue swelling is seen over lateral malleolus. Electronically Signed   By: Lupita Raider, M.D.   On: 07/25/2018 12:38   ____________________________________________   PROCEDURES  Procedure(s) performed:   .Splint Application Date/Time: 07/25/2018 1:00 PM Performed by: McLamb, Jerrel Ivory, RN Authorized by: Tommi Rumps, PA-C   Consent:    Consent obtained:  Verbal   Consent given by:  Patient   Risks discussed:  Pain   Alternatives discussed:  Referral Pre-procedure details:    Sensation:  Normal Procedure details:    Laterality:  Left   Location:  Ankle   Ankle:  L ankle   Strapping: yes     Splint type:  Ankle stirrup   Supplies:  Prefabricated splint Post-procedure details:    Pain:  Improved   Sensation:  Normal   Patient tolerance of procedure:  Tolerated well, no immediate complications    Critical Care performed: No  ____________________________________________   INITIAL IMPRESSION / ASSESSMENT AND PLAN / ED COURSE  As part of my medical decision making, I reviewed the following data within the electronic MEDICAL RECORD NUMBER Notes from prior ED visits and [place] Controlled Substance Database  Patient presents to the ED with complaint of left ankle pain after an injury while working out at the gym last evening.  She is continued to have pain and swelling this morning.  Physical exam is low risk for fracture.  X-rays were reassuring that there was no acute bony injury.  Patient was placed in a Velcro stirrup splint and given crutches.  A prescription for naproxen to be taken twice a day was given.  She is to follow-up with her PCP if any continued problems.  ____________________________________________   FINAL CLINICAL IMPRESSION(S) / ED  DIAGNOSES  Final diagnoses:  Sprain of left ankle, unspecified ligament, initial encounter     ED Discharge Orders         Ordered    naproxen (NAPROSYN) 375 MG tablet  2 times daily PRN     07/25/18 1248           Note:  This document was prepared using Dragon voice recognition software and may include unintentional dictation errors.    Tommi Rumps, PA-C 07/25/18 1300    Emily Filbert, MD 07/25/18 1420

## 2018-07-25 NOTE — ED Triage Notes (Signed)
PT arrived with complaints of left ankle pain after injury yesterday.

## 2018-07-25 NOTE — ED Notes (Signed)
See triage note  States she stepped down from elliptical last pm   Twisted left ankle  States she fell but denies any other pain  Swelling noted  No deformity noted   Good pulses

## 2018-10-18 DIAGNOSIS — K036 Deposits [accretions] on teeth: Secondary | ICD-10-CM | POA: Diagnosis not present

## 2018-10-18 DIAGNOSIS — K0500 Acute gingivitis, plaque induced: Secondary | ICD-10-CM | POA: Diagnosis not present

## 2018-10-18 DIAGNOSIS — Z1281 Encounter for screening for malignant neoplasm of oral cavity: Secondary | ICD-10-CM | POA: Diagnosis not present

## 2018-11-16 ENCOUNTER — Ambulatory Visit
Admission: RE | Admit: 2018-11-16 | Discharge: 2018-11-16 | Disposition: A | Discharge status: Home / Self Care | Payer: BLUE CROSS/BLUE SHIELD | Source: Ambulatory Visit | Attending: Primary Care | Admitting: Primary Care

## 2018-11-16 ENCOUNTER — Encounter: Payer: Self-pay | Admitting: Primary Care

## 2018-11-16 ENCOUNTER — Ambulatory Visit: Payer: BLUE CROSS/BLUE SHIELD | Admitting: Primary Care

## 2018-11-16 ENCOUNTER — Other Ambulatory Visit: Payer: Self-pay | Admitting: Primary Care

## 2018-11-16 VITALS — BP 108/74 | HR 77 | Temp 98.8°F | Ht 60.0 in | Wt 157.5 lb

## 2018-11-16 DIAGNOSIS — N63 Unspecified lump in unspecified breast: Secondary | ICD-10-CM

## 2018-11-16 DIAGNOSIS — N6489 Other specified disorders of breast: Secondary | ICD-10-CM | POA: Diagnosis not present

## 2018-11-16 NOTE — Progress Notes (Signed)
Subjective:    Patient ID: Jill Osborne, female    DOB: 09-Oct-2001, 18 y.o.   MRN: [id number]  HPI  Ms. Jill Osborne is a 18 year old female who presents today with a chief complaint of breast mass.  Her mass is located to the left breast at the 12-1 o'clock region which she noticed about three days ago. She endorses pain to the site and to surrounding areas. Also with some "warmth". Denies redness. She's not taken anything for her symptoms. She has no family breast cancer. Her menstrual cycles are regular, LMP was one month ago, has IUD.   Review of Systems  Constitutional: Negative for fever.  Genitourinary:       Breast mass  Skin: Negative for color change.       Past Medical History:  Diagnosis Date  . Asthma   . Depression   . Lumbar herniated disc      Social History   Socioeconomic History  . Marital status: Single    Spouse name: Not on file  . Number of children: Not on file  . Years of education: Not on file  . Highest education level: Not on file  Occupational History  . Not on file  Social Needs  . Financial resource strain: Not on file  . Food insecurity:    Worry: Not on file    Inability: Not on file  . Transportation needs:    Medical: Not on file    Non-medical: Not on file  Tobacco Use  . Smoking status: Never Smoker  . Smokeless tobacco: Never Used  Substance and Sexual Activity  . Alcohol use: No  . Drug use: No  . Sexual activity: Yes    Partners: Male    Birth control/protection: None, Condom  Lifestyle  . Physical activity:    Days per week: Not on file    Minutes per session: Not on file  . Stress: Not on file  Relationships  . Social connections:    Talks on phone: Not on file    Gets together: Not on file    Attends religious service: Not on file    Active member of club or organization: Not on file    Attends meetings of clubs or organizations: Not on file    Relationship status: Not on file  . Intimate partner violence:   Fear of current or ex partner: Not on file    Emotionally abused: Not on file    Physically abused: Not on file    Forced sexual activity: Not on file  Other Topics Concern  . Not on file  Social History Narrative   Single.   Student at [employer], [employer].   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   Enjoys reading, spending time with friends.    Past Surgical History:  Procedure Laterality Date  . LAPAROSCOPIC APPENDECTOMY N/A 07/15/2016   Procedure: APPENDECTOMY LAPAROSCOPIC;  Surgeon: Ricarda Frame, MD;  Location: [facility] ORS;  Service: General;  Laterality: N/A;    Family History  Problem Relation Age of Onset  . Healthy Mother   . Healthy Father     Allergies  Allergen Reactions  . Shellfish Allergy Hives    No current outpatient medications on file prior to visit.   Current Facility-Administered Medications on File Prior to Visit  Medication Dose Route Frequency Provider Last Rate Last Dose  . Levonorgestrel IUD 19.5 mg  1 each Intrauterine Continuous Reva Bores, MD   19.5 mg at  04/26/18 1030    BP 108/74   Pulse 77   Temp 98.8 F (37.1 C) (Oral)   Ht 5' (1.524 m)   Wt 157 lb 8 oz (71.4 kg)   SpO2 98%   BMI 30.76 kg/m    Objective:   Physical Exam  Constitutional: She appears well-nourished.  Cardiovascular: Normal rate.  Respiratory: Effort normal. Right breast exhibits no nipple discharge, no skin change and no tenderness. Left breast exhibits mass and tenderness. Left breast exhibits no nipple discharge and no skin change.    Semi firm mass to left chest wall at 12-1 o'clock region of breast. Tender. No erythema.   Skin: Skin is warm and dry. No erythema.           Assessment & Plan:

## 2018-11-16 NOTE — Patient Instructions (Signed)
Stop by the front desk and speak with either Marion or Anastasiya regarding your ultrasound.  It was a pleasure to see you today!    

## 2018-12-19 ENCOUNTER — Ambulatory Visit (INDEPENDENT_AMBULATORY_CARE_PROVIDER_SITE_OTHER): Payer: BLUE CROSS/BLUE SHIELD | Admitting: Obstetrics & Gynecology

## 2018-12-19 ENCOUNTER — Encounter: Payer: Self-pay | Admitting: Obstetrics & Gynecology

## 2018-12-19 VITALS — BP 105/71 | HR 71 | Wt 151.6 lb

## 2018-12-19 DIAGNOSIS — Z975 Presence of (intrauterine) contraceptive device: Secondary | ICD-10-CM | POA: Diagnosis not present

## 2018-12-19 DIAGNOSIS — N921 Excessive and frequent menstruation with irregular cycle: Secondary | ICD-10-CM | POA: Diagnosis not present

## 2018-12-19 NOTE — Progress Notes (Signed)
   GYNECOLOGY OFFICE VISIT NOTE   History:  Jill Osborne is a 18 y.o. G0 here today for having a prolonged heavy menstrual period with the [facility] in place a few months ago. Normal periods before and after this episode. No other concerning symptoms. Not concerned about pregnancy; she just wanted to make sure the IUD was okay. She denies any abnormal vaginal discharge, bleeding, pelvic pain or other concerns.    Past Medical History:  Diagnosis Date  . Asthma   . Depression   . Lumbar herniated disc     Past Surgical History:  Procedure Laterality Date  . LAPAROSCOPIC APPENDECTOMY N/A 07/15/2016   Procedure: APPENDECTOMY LAPAROSCOPIC;  Surgeon: Ricarda Frame, MD;  Location: [facility] ORS;  Service: General;  Laterality: N/A;    The following portions of the patient's history were reviewed and updated as appropriate: allergies, current medications, past family history, past medical history, past social history, past surgical history and problem list.     Review of Systems:  Pertinent items noted in HPI and remainder of comprehensive ROS otherwise negative.  Physical Exam:  BP 105/71   Pulse 71   Wt 151 lb 9.6 oz (68.8 kg)   BMI 29.61 kg/m  CONSTITUTIONAL: Well-developed, well-nourished female in no acute distress.  HEENT:  Normocephalic, atraumatic. External right and left ear normal. No scleral icterus.  NECK: Normal range of motion, supple, no masses noted on observation SKIN: No rash noted. Not diaphoretic. No erythema. No pallor. MUSCULOSKELETAL: Normal range of motion. No edema noted. NEUROLOGIC: Alert and oriented to person, place, and time. Normal muscle tone coordination. No cranial nerve deficit noted. PSYCHIATRIC: Normal mood and affect. Normal behavior. Normal judgment and thought content. CARDIOVASCULAR: Normal heart rate noted RESPIRATORY: Effort and breath sounds normal, no problems with respiration noted ABDOMEN: No masses noted. No other overt distention noted.     PELVIC: Deferred by patient (I offered to do a string check and she declined)       Assessment and Plan:     1. Heavy bleeding with IUD Only had one episode, normal periods since then. Reassured patient that this can occur sporadically.  She was told to call/come for recurrence or persistent heavy or prolonged bleeding, also encouraged to take home UPT if she is ever concerned about pregnancy.    Routine preventative health maintenance measures emphasized. Please refer to After Visit Summary for other counseling recommendations.   Return for any gynecologic concerns.    Total face-to-face time with patient: 10 minutes.  Over 50% of encounter was spent on counseling and coordination of care.   Jaynie Collins, MD, FACOG Obstetrician & Gynecologist, [facility] [employer], [facility]

## 2018-12-19 NOTE — Patient Instructions (Signed)
Return to clinic for any scheduled appointments or for any gynecologic concerns as needed.   

## 2019-03-06 ENCOUNTER — Emergency Department: Payer: BLUE CROSS/BLUE SHIELD

## 2019-03-06 ENCOUNTER — Emergency Department
Admission: EM | Admit: 2019-03-06 | Discharge: 2019-03-06 | Disposition: A | Discharge status: Home / Self Care | Payer: BLUE CROSS/BLUE SHIELD | Attending: Emergency Medicine | Admitting: Emergency Medicine

## 2019-03-06 ENCOUNTER — Other Ambulatory Visit: Payer: Self-pay

## 2019-03-06 ENCOUNTER — Encounter: Payer: Self-pay | Admitting: Emergency Medicine

## 2019-03-06 DIAGNOSIS — Z23 Encounter for immunization: Secondary | ICD-10-CM | POA: Insufficient documentation

## 2019-03-06 DIAGNOSIS — M26609 Unspecified temporomandibular joint disorder, unspecified side: Secondary | ICD-10-CM | POA: Diagnosis not present

## 2019-03-06 DIAGNOSIS — R590 Localized enlarged lymph nodes: Secondary | ICD-10-CM | POA: Diagnosis not present

## 2019-03-06 DIAGNOSIS — R253 Fasciculation: Secondary | ICD-10-CM | POA: Diagnosis not present

## 2019-03-06 DIAGNOSIS — R29818 Other symptoms and signs involving the nervous system: Secondary | ICD-10-CM | POA: Diagnosis not present

## 2019-03-06 DIAGNOSIS — A35 Other tetanus: Secondary | ICD-10-CM | POA: Insufficient documentation

## 2019-03-06 DIAGNOSIS — J45909 Unspecified asthma, uncomplicated: Secondary | ICD-10-CM | POA: Diagnosis not present

## 2019-03-06 LAB — CBC WITH DIFFERENTIAL/PLATELET
Abs Immature Granulocytes: 0.02 10*3/uL (ref 0.00–0.07)
Basophils Absolute: 0 10*3/uL (ref 0.0–0.1)
Basophils Relative: 0 %
Eosinophils Absolute: 0.1 10*3/uL (ref 0.0–0.5)
Eosinophils Relative: 1 %
HCT: 40.9 % (ref 36.0–46.0)
Hemoglobin: 13.6 g/dL (ref 12.0–15.0)
Immature Granulocytes: 0 %
Lymphocytes Relative: 13 %
Lymphs Abs: 1.3 10*3/uL (ref 0.7–4.0)
MCH: 29.2 pg (ref 26.0–34.0)
MCHC: 33.3 g/dL (ref 30.0–36.0)
MCV: 87.8 fL (ref 80.0–100.0)
Monocytes Absolute: 0.4 10*3/uL (ref 0.1–1.0)
Monocytes Relative: 4 %
Neutro Abs: 7.5 10*3/uL (ref 1.7–7.7)
Neutrophils Relative %: 82 %
Platelets: 244 10*3/uL (ref 150–400)
RBC: 4.66 MIL/uL (ref 3.87–5.11)
RDW: 12.6 % (ref 11.5–15.5)
WBC: 9.3 10*3/uL (ref 4.0–10.5)
nRBC: 0 % (ref 0.0–0.2)

## 2019-03-06 LAB — COMPREHENSIVE METABOLIC PANEL
ALT: 11 U/L (ref 0–44)
AST: 16 U/L (ref 15–41)
Albumin: 4.8 g/dL (ref 3.5–5.0)
Alkaline Phosphatase: 62 U/L (ref 38–126)
Anion gap: 9 (ref 5–15)
BUN: 12 mg/dL (ref 6–20)
CO2: 23 mmol/L (ref 22–32)
Calcium: 9.4 mg/dL (ref 8.9–10.3)
Chloride: 105 mmol/L (ref 98–111)
Creatinine, Ser: 0.63 mg/dL (ref 0.44–1.00)
GFR calc Af Amer: >60 mL/min (ref >60)
GFR calc non Af Amer: >60 mL/min (ref >60)
Glucose, Bld: 83 mg/dL (ref 70–99)
Potassium: 3.9 mmol/L (ref 3.5–5.1)
Sodium: 137 mmol/L (ref 135–145)
Total Bilirubin: 0.6 mg/dL (ref 0.3–1.2)
Total Protein: 8 g/dL (ref 6.5–8.1)

## 2019-03-06 LAB — LACTIC ACID, PLASMA
Lactic Acid, Venous: 1 mmol/L (ref 0.5–1.9)
Lactic Acid, Venous: 1 mmol/L (ref 0.5–1.9)

## 2019-03-06 MED ORDER — DIPHENHYDRAMINE HCL 50 MG/ML IJ SOLN
50.0000 mg | Freq: Once | INTRAMUSCULAR | Status: AC
  Administered 2019-03-06: 22:00:00 50 mg via INTRAVENOUS
  Filled 2019-03-06: qty 1

## 2019-03-06 MED ORDER — METRONIDAZOLE IN NACL 5-0.79 MG/ML-% IV SOLN
500.0000 mg | Freq: Once | INTRAVENOUS | Status: AC
  Administered 2019-03-06: 500 mg via INTRAVENOUS
  Filled 2019-03-06: qty 100

## 2019-03-06 MED ORDER — METRONIDAZOLE 500 MG PO TABS: 500.0000 mg | ORAL_TABLET | Freq: Two times a day (BID) | ORAL | 0 refills | Status: AC

## 2019-03-06 MED ORDER — TETANUS-DIPHTH-ACELL PERTUSSIS 5-2.5-18.5 LF-MCG/0.5 IM SUSP
0.5000 mL | Freq: Once | INTRAMUSCULAR | Status: AC
  Administered 2019-03-06: 23:00:00 0.5 mL via INTRAMUSCULAR
  Filled 2019-03-06: qty 0.5

## 2019-03-06 MED ORDER — LORAZEPAM 2 MG/ML IJ SOLN
2.0000 mg | Freq: Once | INTRAMUSCULAR | Status: AC
  Administered 2019-03-06: 2 mg via INTRAVENOUS
  Filled 2019-03-06: qty 1

## 2019-03-06 MED ORDER — IOHEXOL 300 MG/ML  SOLN
75.0000 mL | Freq: Once | INTRAMUSCULAR | Status: AC | PRN
  Administered 2019-03-06: 75 mL via INTRAVENOUS
  Filled 2019-03-06: qty 75

## 2019-03-06 MED ORDER — DIAZEPAM 2 MG PO TABS: 2.0000 mg | ORAL_TABLET | Freq: Four times a day (QID) | ORAL | 0 refills | Status: AC | PRN

## 2019-03-06 MED ORDER — SODIUM CHLORIDE 0.9 % IV BOLUS
1000.0000 mL | Freq: Once | INTRAVENOUS | Status: AC
  Administered 2019-03-06: 1000 mL via INTRAVENOUS

## 2019-03-06 NOTE — ED Notes (Signed)
Pt has intermittent circum-oral muscle tightness that is somewhat predictable in nature. Pt states she has difficulty speaking.

## 2019-03-06 NOTE — ED Triage Notes (Signed)
Pt in via POV, pt tearful, states her face started twitching about an hour ago and she feels that its difficult to open her mouth to talk.  Face symmetrical, no involuntary movements noted at this time.  Vitals WDL, NAD noted.

## 2019-03-06 NOTE — ED Provider Notes (Signed)
[facility] Emergency Department Provider Note  ____________________________________________  Time seen: Approximately 6:32 PM  I have reviewed the triage vital signs and the nursing notes.   HISTORY  Chief Complaint Other    HPI Jill Osborne is a 18 y.o. female who presents emergency department complaining of left-sided temporal pain, tight/twitching sensation of the left face, trismus of the jaw.  Patient reports the symptoms have been ongoing x2 hours.  They appear very rapidly.  Patient denies any dental pain, sore throat, nasal congestion, URI symptoms.  She denies any neck pain or stiffness.  No recent illnesses.  Patient denies any trauma to the area.  Patient does report however that she had a laceration from a razor blade approximately 10 days ago.  This did not appear to need sutures so she did not seek medical care.  Patient reports that this area healed appropriately with no residual signs of infection.  Patient denies any chronic use of medications.  She denies any antidepressant or antipsychotic use is.  She denies any illicit drug use.  Patient denies any other symptoms or complaints at this time.  No medications for his complaint prior to arrival.         Past Medical History:  Diagnosis Date  . Asthma   . Depression   . Lumbar herniated disc     There are no active problems to display for this patient.   Past Surgical History:  Procedure Laterality Date  . LAPAROSCOPIC APPENDECTOMY N/A 07/15/2016   Procedure: APPENDECTOMY LAPAROSCOPIC;  Surgeon: Ricarda Frame, MD;  Location: [facility] ORS;  Service: General;  Laterality: N/A;    Prior to Admission medications   Medication Sig Start Date End Date Taking? Authorizing Provider  diazepam (VALIUM) 2 MG tablet Take 1 tablet (2 mg total) by mouth every 6 (six) hours as needed for up to 5 days (jaw trismus). 03/06/19 03/11/19  Cuthriell, Delorise Royals, PA-C  metroNIDAZOLE (FLAGYL) 500 MG tablet Take  1 tablet (500 mg total) by mouth 2 (two) times daily for 10 days. 03/06/19 03/16/19  Cuthriell, Delorise Royals, PA-C    Allergies Shellfish allergy and Eggs or egg-derived products  Family History  Problem Relation Age of Onset  . Healthy Mother   . Healthy Father     Social History Social History   Tobacco Use  . Smoking status: Never Smoker  . Smokeless tobacco: Never Used  Substance Use Topics  . Alcohol use: No  . Drug use: No     Review of Systems  Constitutional: No fever/chills Eyes: No visual changes. No discharge ENT: No upper respiratory complaints. Cardiovascular: no chest pain. Respiratory: no cough. No SOB. Gastrointestinal: No abdominal pain.  No nausea, no vomiting.  No diarrhea.  No constipation. Genitourinary: Negative for dysuria. No hematuria Musculoskeletal: Trismus of the jaw Skin: Negative for rash, abrasions, lacerations, ecchymosis. Neurological: Left temporal pain but no described headache.  Denies focal weakness or numbness. 10-point ROS otherwise negative.  ____________________________________________   PHYSICAL EXAM:  VITAL SIGNS: ED Triage Vitals  Enc Vitals Group     BP 03/06/19 1743 123/80     Pulse Rate 03/06/19 1743 88     Resp 03/06/19 1743 20     Temp 03/06/19 1743 98.6 F (37 C)     Temp Source 03/06/19 1743 Oral     SpO2 03/06/19 1743 99 %     Weight 03/06/19 1744 162 lb (73.5 kg)     Height 03/06/19 1744 5' (1.524  m)     Head Circumference --      Peak Flow --      Pain Score 03/06/19 1744 3     Pain Loc --      Pain Edu? --      Excl. in GC? --      Constitutional: Alert and oriented. Well appearing and in no acute distress. Eyes: Conjunctivae are normal. PERRL. EOMI. Head: Atraumatic.  No gross erythema, edema of the head or face.  Palpation in the left temporal region reveals no palpable lesions or cords.  Patient has significant trismus to the jaw with almost no range of motion, no palpable TMJ abnormality or  crepitus.  No tenderness to palpation along the jaw. ENT:      Ears:       Nose: No congestion/rhinnorhea.      Mouth/Throat: Mucous membranes are moist.  No gross erythema or edema.  Visualization is very limited given amount of trismus.  Oropharynx is not well visualized. Neck: No stridor.  Neck is supple full range of motion.  No cervical spine tenderness to palpation.  Negative Kernig's and brudzinski Hematological/Lymphatic/Immunilogical: No cervical lymphadenopathy. Cardiovascular: Normal rate, regular rhythm. Normal S1 and S2.  Good peripheral circulation. Respiratory: Normal respiratory effort without tachypnea or retractions. Lungs CTAB. Good air entry to the bases with no decreased or absent breath sounds. Musculoskeletal: Full range of motion to all extremities. No gross deformities appreciated. Neurologic:  Normal speech and language. No gross focal neurologic deficits are appreciated.  Cranial nerve testing is grossly intact with the exception of trigeminal and facial nerve distribution of the left side.  No paralysis, just limited musculoskeletal function due to trismus. Skin:  Skin is warm, dry and intact. No rash noted. Psychiatric: Mood and affect are normal. Speech and behavior are normal. Patient exhibits appropriate insight and judgement.   ____________________________________________   LABS (all labs ordered are listed, but only abnormal results are displayed)  Labs Reviewed  COMPREHENSIVE METABOLIC PANEL  CBC WITH DIFFERENTIAL/PLATELET  LACTIC ACID, PLASMA  LACTIC ACID, PLASMA   ____________________________________________  EKG   ____________________________________________  RADIOLOGY I personally viewed and evaluated these images as part of my medical decision making, as well as reviewing the written report by the radiologist.  Ct Head Wo Contrast  Result Date: 03/06/2019 CLINICAL DATA:  Twitching, difficult to open mouth EXAM: CT HEAD WITHOUT CONTRAST  TECHNIQUE: Contiguous axial images were obtained from the base of the skull through the vertex without intravenous contrast. COMPARISON:  None. FINDINGS: Brain: No evidence of acute infarction, hemorrhage, hydrocephalus, extra-axial collection or mass lesion/mass effect. Vascular: No hyperdense vessel or unexpected calcification. Skull: Normal. Negative for fracture or focal lesion. Sinuses/Orbits: No acute finding. Other: None IMPRESSION: Negative non contrasted CT appearance of the brain. Electronically Signed   By: Jasmine PangKim  Fujinaga M.D.   On: 03/06/2019 18:18   Ct Soft Tissue Neck W Contrast  Result Date: 03/06/2019 CLINICAL DATA:  18 y/o F; possible tetanus, locked jaw, abscess, temple pain. Recent laceration. Numbness in the face. EXAM: CT NECK WITH CONTRAST; CT MAXILLOFACIAL WITH CONTRAST TECHNIQUE: Multidetector CT imaging of the maxillofacial structures was performed with intravenous contrast. Multiplanar CT image reconstructions were also generated. Multidetector CT imaging of the neck was performed using the standard protocol following the bolus administration of intravenous contrast. CONTRAST:  75mL OMNIPAQUE IOHEXOL 300 MG/ML  SOLN COMPARISON:  None. FINDINGS: CT neck findings Pharynx and larynx: Normal. No mass or swelling. Streak artifact from dental hardware  partially obscures the oropharynx. Salivary glands: No inflammation, mass, or stone. Thyroid: Normal. Lymph nodes: There is mild enlargement of the bilateral upper posterior cervical lymph nodes in right submandibular lymph nodes. No lymph node necrosis or abscess. Vascular: Negative. Limited intracranial: Negative. Visualized orbits: Negative. Mastoids and visualized paranasal sinuses: Clear. Skeleton: No acute or aggressive process. Upper chest: Negative. Other: None. CT maxillofacial findings Osseous: No fracture or mandibular dislocation. No destructive process. Orbits: Negative. No traumatic or inflammatory finding. Sinuses: Mild mucosal  thickening of the right maxillary sinus. Additional paranasal sinuses and the mastoid air cells are normally aerated. Soft tissues: Negative. Limited intracranial: No significant or unexpected finding. IMPRESSION: Mild enlargement of bilateral upper posterior cervical lymph nodes and right submandibular lymph nodes, likely reactive. No associated mass or inflammatory process identified. Otherwise unremarkable CT of neck and maxillofacial. Electronically Signed   By: Mitzi Hansen M.D.   On: 03/06/2019 20:33   Ct Maxillofacial W Contrast  Result Date: 03/06/2019 CLINICAL DATA:  18 y/o F; possible tetanus, locked jaw, abscess, temple pain. Recent laceration. Numbness in the face. EXAM: CT NECK WITH CONTRAST; CT MAXILLOFACIAL WITH CONTRAST TECHNIQUE: Multidetector CT imaging of the maxillofacial structures was performed with intravenous contrast. Multiplanar CT image reconstructions were also generated. Multidetector CT imaging of the neck was performed using the standard protocol following the bolus administration of intravenous contrast. CONTRAST:  35mL OMNIPAQUE IOHEXOL 300 MG/ML  SOLN COMPARISON:  None. FINDINGS: CT neck findings Pharynx and larynx: Normal. No mass or swelling. Streak artifact from dental hardware partially obscures the oropharynx. Salivary glands: No inflammation, mass, or stone. Thyroid: Normal. Lymph nodes: There is mild enlargement of the bilateral upper posterior cervical lymph nodes in right submandibular lymph nodes. No lymph node necrosis or abscess. Vascular: Negative. Limited intracranial: Negative. Visualized orbits: Negative. Mastoids and visualized paranasal sinuses: Clear. Skeleton: No acute or aggressive process. Upper chest: Negative. Other: None. CT maxillofacial findings Osseous: No fracture or mandibular dislocation. No destructive process. Orbits: Negative. No traumatic or inflammatory finding. Sinuses: Mild mucosal thickening of the right maxillary sinus.  Additional paranasal sinuses and the mastoid air cells are normally aerated. Soft tissues: Negative. Limited intracranial: No significant or unexpected finding. IMPRESSION: Mild enlargement of bilateral upper posterior cervical lymph nodes and right submandibular lymph nodes, likely reactive. No associated mass or inflammatory process identified. Otherwise unremarkable CT of neck and maxillofacial. Electronically Signed   By: Mitzi Hansen M.D.   On: 03/06/2019 20:33    ____________________________________________    PROCEDURES  Procedure(s) performed:    Procedures    Medications  Tdap (BOOSTRIX) injection 0.5 mL (has no administration in time range)  sodium chloride 0.9 % bolus 1,000 mL (1,000 mLs Intravenous New Bag/Given 03/06/19 2143)  iohexol (OMNIPAQUE) 300 MG/ML solution 75 mL (75 mLs Intravenous Contrast Given 03/06/19 2000)  metroNIDAZOLE (FLAGYL) IVPB 500 mg (500 mg Intravenous New Bag/Given 03/06/19 2146)  LORazepam (ATIVAN) injection 2 mg (2 mg Intravenous Given 03/06/19 2145)  diphenhydrAMINE (BENADRYL) injection 50 mg (50 mg Intravenous Given 03/06/19 2144)     ____________________________________________   INITIAL IMPRESSION / ASSESSMENT AND PLAN / ED COURSE  Pertinent labs & imaging results that were available during my care of the patient were reviewed by me and considered in my medical decision making (see chart for details).  Review of the [place] CSRS was performed in accordance of the [facility] prior to dispensing any controlled drugs.  Clinical Course as of Mar 05 2253  Wed Mar 06, 2019  1949  Patient presented to the emergency department complaining of left parietal head pain, significant trismus.  Patient did not describe pain as a headache.  She denies any trauma.  No recent illnesses.  Patient did however sustain a laceration from a razor blade approximately 10 days ago that was not treated by medical professional.  On exam, significant trismus.  No palpable  cords or lesions in the temporal region.  Differential included trauma/fracture of the mandible, TMJ syndrome, temporal arteritis, trigeminal or facial nerve paralysis, early shingles, dystonia, serotonin syndrome, dermatological infection, retropharyngeal or peritonsillar abscess, tetanus.  Given broad differential, patient will be evaluated with labs as well as further imaging of the face and neck.  Patient denies any chronic medication use.  No antipsychotics or antidepressants.  No reported trauma to the area.  No findings concerning for temporal arteritis.  At this time, pending imaging, main concern exist for tetanus versus deep space infection.  Patient does have significant trismus, recent injury from razor blade, sardonic smile.   [JC]    Clinical Course User Index [JC] Cuthriell, Delorise Royals, PA-C          Patient's diagnosis is consistent with tetanus.  Patient presented to the emergency department complaining of trismus, left-sided temporal pain.  Mother was a broad differential, patient does have findings that are consistent with tetanus including injury from a razor blade approximately 10 days ago, trismus, sardonic smile.  Differential included other sources of infection, medication side effect, psychosomatic.  On imaging, patient had a negative head CT, reassuring CTs of the face and neck.  No source of infection identified.  Patient on no antipsychotics for a likely diagnosis of dystonia.  Patient has no facial nerve droop/paralysis to indicate Bell's palsy.  Patient received IV Flagyl and IV Ativan.  Patient also received IV Benadryl for the relaxation..  Patient has had a slight improvement of symptoms being able to open her jaw slightly, converse more freely.  At this time, patient can tolerate oral medications.  I discussed at length the differential in my diagnosis with the patient and her family via telephone.  At this time, patient will be prescribed 10 days worth of Flagyl as well  as oral Valium at home.  I have discussed the immediate return to the emergency department for any new or worsening symptoms.  They verbalized understanding of same.  Patient is encouraged to follow-up with primary care for ongoing evaluation of tetanus.  Patient is given ED precautions to return to the ED for any worsening or new symptoms.     ____________________________________________  FINAL CLINICAL IMPRESSION(S) / ED DIAGNOSES  Final diagnoses:  Tetanus      NEW MEDICATIONS STARTED DURING THIS VISIT:  ED Discharge Orders         Ordered    metroNIDAZOLE (FLAGYL) 500 MG tablet  2 times daily     03/06/19 2213    diazepam (VALIUM) 2 MG tablet  Every 6 hours PRN     03/06/19 2213              This chart was dictated using voice recognition software/Dragon. Despite best efforts to proofread, errors can occur which can change the meaning. Any change was purely unintentional.    Racheal Patches, PA-C 03/06/19 2339    Sharman Cheek, MD 03/13/19 [phone]

## 2019-03-08 ENCOUNTER — Ambulatory Visit (INDEPENDENT_AMBULATORY_CARE_PROVIDER_SITE_OTHER): Payer: BLUE CROSS/BLUE SHIELD | Admitting: Primary Care

## 2019-03-08 ENCOUNTER — Encounter: Payer: Self-pay | Admitting: Primary Care

## 2019-03-08 DIAGNOSIS — A35 Other tetanus: Secondary | ICD-10-CM | POA: Insufficient documentation

## 2019-03-08 NOTE — Assessment & Plan Note (Signed)
Diagnosed during ED visit on 03/06/19. Based off of symptoms/presentation and HPI this seems like a very reasonable diagnosis.   Tetanus vaccination was up to date, due in 2022. She was provided with a Tdap during her stay on 03/06/19. Continue metronidazole as prescribed and valium as needed.  She appears well and seems to be doing well. Discussed to notify me if symptoms did not continue to improve.  ED notes, labs, imaging reviewed.

## 2019-03-08 NOTE — Patient Instructions (Signed)
Continue the metronidazole antibiotics as prescribed.  Use the diazepam (Valium) only if needed for tightness to the jaw.  Please notify me if your symptoms don't continue to improve as discussed.  It was a pleasure to see you today! Mayra Reel, NP-C

## 2019-03-08 NOTE — Progress Notes (Signed)
Subjective:    Patient ID: Jill Osborne, female    DOB: 2001/09/19, 18 y.o.   MRN: [id number]  HPI  Virtual Visit via Video Note  I connected with Jill Osborne on 03/08/19 at 11:20 AM EDT by a video enabled telemedicine application and verified that I am speaking with the correct person using two identifiers.  Location: Patient: Home Provider: Office   I discussed the limitations of evaluation and management by telemedicine and the availability of in person appointments. The patient expressed understanding and agreed to proceed.  History of Present Illness:  Jill Osborne is a 18 year old female who presents today for emergency department follow up.  She presented to [facility] ED on 03/06/19 with a chief complaint of left sided temporal pain with "twitching" sensation to the left face and trismus of the jaw.  She endorsed cutting the right upper lip with a razor blade about 10 days prior, didn't seek medical attention.   During her stay in the ED her exam was with significant trismus and dystonia to the left jaw. She underwent CT head, CT face and neck which were negative. Labs unremarkable. Symptoms were suspected to be secondary to tetanus so she was treated symptomatically and provided with a  Tetanus vaccination. She was provided with IV Flagyl, Ativan, and Benadryl for potential infection and for facial relaxation. Her dystonia eventually improved so she was discharged home later that evening with recommendations for PCP follow up. She was discharged home with oral antibiotics and oral valium.  Since her ED visit she's doing better. She does still experience intermittent tightness to her left jaw, notices her mouth will droop slightly when not talking. She is compliant to her metronidazole and is using valium on occasion for residual tightness. She is eating and drinking well. She was provided with a tetanus vaccination in the ED, she was told that her symptoms were secondary to Tetanus.  Last Tetanus prior to then was in 2012.   Observations/Objective:  Alert and oriented. Appears well, not sickly. No distress. Speaking in complete sentences.  Able to open jaw moderately well, not fully able to open. Relaxation of her oral cavity noted with bilateral downward drooping when not talking.  Assessment and Plan:  See problem based charting.  Follow Up Instructions:  Continue the metronidazole antibiotics as prescribed.  Use the diazepam (Valium) only if needed for tightness to the jaw.  Please notify me if your symptoms don't continue to improve as discussed.  It was a pleasure to see you today! Mayra Reel, NP-C    I discussed the assessment and treatment plan with the patient. The patient was provided an opportunity to ask questions and all were answered. The patient agreed with the plan and demonstrated an understanding of the instructions.   The patient was advised to call back or seek an in-person evaluation if the symptoms worsen or if the condition fails to improve as anticipated.     Doreene Nest, NP    Review of Systems  Constitutional: Negative for fever.  HENT: Negative for trouble swallowing.        Jaw stiffness   Respiratory: Negative for shortness of breath.   Musculoskeletal: Positive for myalgias.  Skin: Negative for color change and wound.       Past Medical History:  Diagnosis Date  . Asthma   . Depression   . Lumbar herniated disc      Social History   Socioeconomic History  .  Marital status: Single    Spouse name: Not on file  . Number of children: Not on file  . Years of education: Not on file  . Highest education level: Not on file  Occupational History  . Not on file  Social Needs  . Financial resource strain: Not on file  . Food insecurity:    Worry: Not on file    Inability: Not on file  . Transportation needs:    Medical: Not on file    Non-medical: Not on file  Tobacco Use  . Smoking status: Never  Smoker  . Smokeless tobacco: Never Used  Substance and Sexual Activity  . Alcohol use: No  . Drug use: No  . Sexual activity: Yes    Partners: Male    Birth control/protection: None, Condom  Lifestyle  . Physical activity:    Days per week: Not on file    Minutes per session: Not on file  . Stress: Not on file  Relationships  . Social connections:    Talks on phone: Not on file    Gets together: Not on file    Attends religious service: Not on file    Active member of club or organization: Not on file    Attends meetings of clubs or organizations: Not on file    Relationship status: Not on file  . Intimate partner violence:    Fear of current or ex partner: Not on file    Emotionally abused: Not on file    Physically abused: Not on file    Forced sexual activity: Not on file  Other Topics Concern  . Not on file  Social History Narrative   Single.   Student [employer].   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   Enjoys reading, spending time with friends.    Past Surgical History:  Procedure Laterality Date  . LAPAROSCOPIC APPENDECTOMY N/A 07/15/2016   Procedure: APPENDECTOMY LAPAROSCOPIC;  Surgeon: Ricarda Frameharles Woodham, MD;  Location: [facility] ORS;  Service: General;  Laterality: N/A;    Family History  Problem Relation Age of Onset  . Healthy Mother   . Healthy Father     Allergies  Allergen Reactions  . Shellfish Allergy Hives  . Eggs Or Egg-Derived Products Nausea And Vomiting    Current Outpatient Medications on File Prior to Visit  Medication Sig Dispense Refill  . diazepam (VALIUM) 2 MG tablet Take 1 tablet (2 mg total) by mouth every 6 (six) hours as needed for up to 5 days (jaw trismus). 20 tablet 0  . metroNIDAZOLE (FLAGYL) 500 MG tablet Take 1 tablet (500 mg total) by mouth 2 (two) times daily for 10 days. 20 tablet 0   Current Facility-Administered Medications on File Prior to Visit  Medication Dose Route Frequency Provider Last Rate Last Dose  .  Levonorgestrel IUD 19.5 mg  1 each Intrauterine Continuous Reva BoresPratt, Tanya S, MD   19.5 mg at 04/26/18 1030    Wt 162 lb (73.5 kg)   BMI 31.64 kg/m    Objective:   Physical Exam  Constitutional: She is oriented to person, place, and time.  HENT:  Able to open jaw moderately well, not fully able to open. Relaxation of her oral cavity noted with bilateral downward drooping when not talking.   Respiratory: Effort normal.  Neurological: She is alert and oriented to person, place, and time.  Skin: Skin is dry. No erythema.           Assessment & Plan:

## 2019-03-18 ENCOUNTER — Encounter: Payer: Self-pay | Admitting: Primary Care

## 2019-03-18 ENCOUNTER — Telehealth: Payer: Self-pay

## 2019-03-18 ENCOUNTER — Telehealth (INDEPENDENT_AMBULATORY_CARE_PROVIDER_SITE_OTHER): Payer: BLUE CROSS/BLUE SHIELD | Admitting: Primary Care

## 2019-03-18 DIAGNOSIS — A35 Other tetanus: Secondary | ICD-10-CM | POA: Diagnosis not present

## 2019-03-18 NOTE — Assessment & Plan Note (Signed)
Suspect symptoms are residual from tetanus. She has had improvement so we will continue to monitor. If her symptoms progress then we will send her to neurology for further evaluation. She will send [place] an update later this week. She does appear better today then when last evaluated.

## 2019-03-18 NOTE — Telephone Encounter (Signed)
Pt called to say her legs are feeling heavy and hard to move. Asking if she should do another Virtual Visit. Please call at [phone]

## 2019-03-18 NOTE — Patient Instructions (Signed)
Please monitor your symptoms and update me later this week as discussed.  It was a pleasure to see you today! Mayra Reel, NP-C

## 2019-03-18 NOTE — Progress Notes (Signed)
Subjective:    Patient ID: Jill Osborne, female    DOB: 06-01-2001, 18 y.o.   MRN: [id number]  HPI  Virtual Visit via Video Note  I connected with Jill Osborne on 03/18/19 at  3:40 PM EDT by a video enabled telemedicine application and verified that I am speaking with the correct person using two identifiers.  Location: Patient: home Provider: office   I discussed the limitations of evaluation and management by telemedicine and the availability of in person appointments. The patient expressed understanding and agreed to proceed.  History of Present Illness:  Jill Osborne is a 18 year old female with a recent diagnosis of tetanus who presents today with a chief complaint of lower extremity weakness.   She was last evaluated on 03/06/19 for ED follow up. She was diagnosed with tetanus given her symptoms and treated accordingly. She was provided with a tetanus vaccination at the time. During our follow up she endorsed intermittent tightness to the left jaw, mouth drooping when at rest, overall better.   Since her last visit she's been doing well. Her symptoms have been gradually improving, some residual heaviness to her lips and left facial twitching. Last night she noticed bilateral lower extremity heaviness, felt like she was walking on water. This has nearly resolved today, only feels this heaviness when laying flat. It doesn't take her any time to adjust when standing. She's not losing her balance. She denies numbness/tingling, injury/overuse of extremities, dizziness. She did complete her antibiotics as prescribed.    Observations/Objective:  Alert and oriented. Appears well, not sickly. No distress. Speaking in complete sentences.  No dropping of her mouth when at rest. No facial twitching.  Assessment and Plan:  Suspect symptoms are residual from tetanus. She has had improvement so we will continue to monitor. If her symptoms progress then we will send her to neurology  for further evaluation. She will send [place] an update later this week. She does appear better today then when last evaluated.   Follow Up Instructions:  Please monitor your symptoms and update me later this week as discussed.  It was a pleasure to see you today! Mayra Reel, NP-C    I discussed the assessment and treatment plan with the patient. The patient was provided an opportunity to ask questions and all were answered. The patient agreed with the plan and demonstrated an understanding of the instructions.   The patient was advised to call back or seek an in-person evaluation if the symptoms worsen or if the condition fails to improve as anticipated.     Jill Nest, NP    Review of Systems  Respiratory: Negative for shortness of breath.   Cardiovascular: Negative for chest pain.  Skin: Negative for color change.  Neurological: Negative for weakness and numbness.       Past Medical History:  Diagnosis Date  . Asthma   . Depression   . Lumbar herniated disc   . Tetanus      Social History   Socioeconomic History  . Marital status: Single    Spouse name: Not on file  . Number of children: Not on file  . Years of education: Not on file  . Highest education level: Not on file  Occupational History  . Not on file  Social Needs  . Financial resource strain: Not on file  . Food insecurity:    Worry: Not on file    Inability: Not on file  . Transportation needs:  Medical: Not on file    Non-medical: Not on file  Tobacco Use  . Smoking status: Never Smoker  . Smokeless tobacco: Never Used  Substance and Sexual Activity  . Alcohol use: No  . Drug use: No  . Sexual activity: Yes    Partners: Male    Birth control/protection: None, Condom  Lifestyle  . Physical activity:    Days per week: Not on file    Minutes per session: Not on file  . Stress: Not on file  Relationships  . Social connections:    Talks on phone: Not on file    Gets together: Not  on file    Attends religious service: Not on file    Active member of club or organization: Not on file    Attends meetings of clubs or organizations: Not on file    Relationship status: Not on file  . Intimate partner violence:    Fear of current or ex partner: Not on file    Emotionally abused: Not on file    Physically abused: Not on file    Forced sexual activity: Not on file  Other Topics Concern  . Not on file  Social History Narrative   Single.   Student at [employer].   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   Enjoys reading, spending time with friends.    Past Surgical History:  Procedure Laterality Date  . LAPAROSCOPIC APPENDECTOMY N/A 07/15/2016   Procedure: APPENDECTOMY LAPAROSCOPIC;  Surgeon: Ricarda Frameharles Woodham, MD;  Location: [facility] ORS;  Service: General;  Laterality: N/A;    Family History  Problem Relation Age of Onset  . Healthy Mother   . Healthy Father     Allergies  Allergen Reactions  . Shellfish Allergy Hives  . Eggs Or Egg-Derived Products Nausea And Vomiting    No current outpatient medications on file prior to visit.   Current Facility-Administered Medications on File Prior to Visit  Medication Dose Route Frequency Provider Last Rate Last Dose  . Levonorgestrel IUD 19.5 mg  1 each Intrauterine Continuous Reva BoresPratt, Tanya S, MD   19.5 mg at 04/26/18 1030    Wt 162 lb (73.5 kg)   BMI 31.64 kg/m    Objective:   Physical Exam  Constitutional: She is oriented to person, place, and time. She appears well-nourished.  Respiratory: Effort normal.  Neurological: She is alert and oriented to person, place, and time.  No facial dropping or muscle twitching   Skin: Skin is dry.  Psychiatric: She has a normal mood and affect.           Assessment & Plan:

## 2019-03-18 NOTE — Telephone Encounter (Signed)
Yes, please set her up for a virtual visit.

## 2019-07-22 ENCOUNTER — Emergency Department (EMERGENCY_DEPARTMENT_HOSPITAL)
Admission: EM | Admit: 2019-07-22 | Discharge: 2019-07-23 | Disposition: A | Discharge status: Other Acute Inpatient Hospital | Payer: BC Managed Care – PPO | Source: Home / Self Care | Attending: Student | Admitting: Student

## 2019-07-22 ENCOUNTER — Encounter: Payer: Self-pay | Admitting: *Deleted

## 2019-07-22 ENCOUNTER — Other Ambulatory Visit: Payer: Self-pay

## 2019-07-22 DIAGNOSIS — G4710 Hypersomnia, unspecified: Secondary | ICD-10-CM | POA: Diagnosis not present

## 2019-07-22 DIAGNOSIS — F419 Anxiety disorder, unspecified: Secondary | ICD-10-CM | POA: Diagnosis not present

## 2019-07-22 DIAGNOSIS — J45909 Unspecified asthma, uncomplicated: Secondary | ICD-10-CM | POA: Insufficient documentation

## 2019-07-22 DIAGNOSIS — F332 Major depressive disorder, recurrent severe without psychotic features: Secondary | ICD-10-CM | POA: Diagnosis not present

## 2019-07-22 DIAGNOSIS — Z20828 Contact with and (suspected) exposure to other viral communicable diseases: Secondary | ICD-10-CM | POA: Insufficient documentation

## 2019-07-22 DIAGNOSIS — Z79899 Other long term (current) drug therapy: Secondary | ICD-10-CM | POA: Diagnosis not present

## 2019-07-22 DIAGNOSIS — R45851 Suicidal ideations: Secondary | ICD-10-CM | POA: Diagnosis not present

## 2019-07-22 DIAGNOSIS — R4589 Other symptoms and signs involving emotional state: Secondary | ICD-10-CM

## 2019-07-22 DIAGNOSIS — F329 Major depressive disorder, single episode, unspecified: Secondary | ICD-10-CM | POA: Diagnosis not present

## 2019-07-22 DIAGNOSIS — G4700 Insomnia, unspecified: Secondary | ICD-10-CM | POA: Diagnosis not present

## 2019-07-22 DIAGNOSIS — Z03818 Encounter for observation for suspected exposure to other biological agents ruled out: Secondary | ICD-10-CM | POA: Diagnosis not present

## 2019-07-22 LAB — COMPREHENSIVE METABOLIC PANEL
ALT: 30 U/L (ref 0–44)
AST: 26 U/L (ref 15–41)
Albumin: 4.7 g/dL (ref 3.5–5.0)
Alkaline Phosphatase: 56 U/L (ref 38–126)
Anion gap: 10 (ref 5–15)
BUN: 9 mg/dL (ref 6–20)
CO2: 25 mmol/L (ref 22–32)
Calcium: 9.6 mg/dL (ref 8.9–10.3)
Chloride: 102 mmol/L (ref 98–111)
Creatinine, Ser: 0.59 mg/dL (ref 0.44–1.00)
GFR calc Af Amer: >60 mL/min (ref >60)
GFR calc non Af Amer: >60 mL/min (ref >60)
Glucose, Bld: 86 mg/dL (ref 70–99)
Potassium: 3.7 mmol/L (ref 3.5–5.1)
Sodium: 137 mmol/L (ref 135–145)
Total Bilirubin: 0.9 mg/dL (ref 0.3–1.2)
Total Protein: 8.1 g/dL (ref 6.5–8.1)

## 2019-07-22 LAB — URINE DRUG SCREEN, QUALITATIVE (ARMC ONLY)
Amphetamines, Ur Screen: NOT DETECTED
Barbiturates, Ur Screen: NOT DETECTED
Benzodiazepine, Ur Scrn: NOT DETECTED
Cannabinoid 50 Ng, Ur ~~LOC~~: NOT DETECTED
Cocaine Metabolite,Ur ~~LOC~~: NOT DETECTED
MDMA (Ecstasy)Ur Screen: NOT DETECTED
Methadone Scn, Ur: NOT DETECTED
Opiate, Ur Screen: NOT DETECTED
Phencyclidine (PCP) Ur S: NOT DETECTED
Tricyclic, Ur Screen: NOT DETECTED

## 2019-07-22 LAB — CBC
HCT: 40.9 % (ref 36.0–46.0)
Hemoglobin: 13.8 g/dL (ref 12.0–15.0)
MCH: 30.3 pg (ref 26.0–34.0)
MCHC: 33.7 g/dL (ref 30.0–36.0)
MCV: 89.9 fL (ref 80.0–100.0)
Platelets: 269 10*3/uL (ref 150–400)
RBC: 4.55 MIL/uL (ref 3.87–5.11)
RDW: 12.8 % (ref 11.5–15.5)
WBC: 7.4 10*3/uL (ref 4.0–10.5)
nRBC: 0 % (ref 0.0–0.2)

## 2019-07-22 LAB — ACETAMINOPHEN LEVEL: Acetaminophen (Tylenol), Serum: <10 ug/mL — ABNORMAL LOW (ref 10–30)

## 2019-07-22 LAB — SALICYLATE LEVEL: Salicylate Lvl: <7 mg/dL (ref 2.8–30.0)

## 2019-07-22 LAB — ETHANOL: Alcohol, Ethyl (B): <10 mg/dL (ref <10)

## 2019-07-22 NOTE — BH Assessment (Signed)
Assessment Note  Jill Osborne is an 18 y.o. female. [name] arrived to the ED by way of personal transportation by her mother.   She reports, "I was suicidal".  She shared that earlier she was having thoughts of hurting herself. She denied having a plan on how to harm herself.  She reports that she has been feeling this way for about 1 month.  She reports that she has been stressed about school.  She is currently a Ship broker at [employer].  She reports symptoms of anxiety.  SH shared that she cries and her heart races and she gets a ringing in her ears.  She reports depressive symptoms.  She shared that she feels a heavy weight on her chest. She has had difficulty sleeping waking up almost every hour. She has had a decrease in her appetite.  She denied having auditory or visual hallucinations.  She denied homicidal ideation or intent. She denied the use of alcohol or drugs.  She reports that she has been facing additional stress from financial concerns and relationship troubles.     Diagnosis: Depression  Past Medical History:  Past Medical History:  Diagnosis Date  . Asthma   . Depression   . Lumbar herniated disc   . Tetanus     Past Surgical History:  Procedure Laterality Date  . LAPAROSCOPIC APPENDECTOMY N/A 07/15/2016   Procedure: APPENDECTOMY LAPAROSCOPIC;  Surgeon: Clayburn Pert, MD;  Location: [facility] ORS;  Service: General;  Laterality: N/A;    Family History:  Family History  Problem Relation Age of Onset  . Healthy Mother   . Healthy Father     Social History:  reports that she has never smoked. She has never used smokeless tobacco. She reports that she does not drink alcohol or use drugs.  Additional Social History:  Alcohol / Drug Use History of alcohol / drug use?: No history of alcohol / drug abuse  CIWA: CIWA-Ar BP: 123/61 Pulse Rate: 81 COWS:    Allergies:  Allergies  Allergen Reactions  . Shellfish Allergy Hives  . Eggs Or Egg-Derived Products Nausea And  Vomiting    Home Medications: (Not in a hospital admission)   OB/GYN Status:  No LMP recorded. (Menstrual status: IUD).  General Assessment Data Location of Assessment: [facility] ED TTS Assessment: In system Is this a Tele or Face-to-Face Assessment?: Face-to-Face Is this an Initial Assessment or a Re-assessment for this encounter?: Initial Assessment Patient Accompanied by:: N/A Language Other than English: No Living Arrangements: Other (Comment)(Private residence) What gender do you identify as?: Female Marital status: Single Pregnancy Status: No Living Arrangements: Parent Can pt return to current living arrangement?: Yes Admission Status: Voluntary Is patient capable of signing voluntary admission?: Yes Referral Source: Self/Family/Friend Insurance type: [place] Screening Exam ([place]) Medical Exam completed: Yes  Crisis Care Plan Living Arrangements: Parent Legal Guardian: Other:(Self) Name of Psychiatrist: None Name of Therapist: Marlowe Aschoff  Education Status Is patient currently in school?: Yes Current Grade: College Name of school: [employer]  Risk to self with the past 6 months Suicidal Ideation: Yes-Currently Present Has patient been a risk to self within the past 6 months prior to admission? : No Suicidal Intent: No Has patient had any suicidal intent within the past 6 months prior to admission? : No Is patient at risk for suicide?: Yes Suicidal Plan?: No Has patient had any suicidal plan within the past 6 months prior to admission? : No Access to Means: No What has  been your use of drugs/alcohol within the last 12 months?: Denied use Previous Attempts/Gestures: Yes How many times?: 1 Other Self Harm Risks: Denied Triggers for Past Attempts: Unknown Intentional Self Injurious Behavior: None Family Suicide History: No Recent stressful life event(s): Financial Problems, Other (Comment)(Relationship and school stressors) Persecutory  voices/beliefs?: Yes Depression: Yes Depression Symptoms: Despondent, Tearfulness Substance abuse history and/or treatment for substance abuse?: No Suicide prevention information given to non-admitted patients: Not applicable  Risk to Others within the past 6 months Homicidal Ideation: No Does patient have any lifetime risk of violence toward others beyond the six months prior to admission? : No Thoughts of Harm to Others: No Current Homicidal Intent: No Current Homicidal Plan: No Access to Homicidal Means: No Identified Victim: None identified History of harm to others?: No Assessment of Violence: None Noted Violent Behavior Description: Denied by patient Does patient have access to weapons?: No Criminal Charges Pending?: No Does patient have a court date: No Is patient on probation?: No  Psychosis Hallucinations: None noted Delusions: None noted  Mental Status Report Appearance/Hygiene: In scrubs Eye Contact: Fair Motor Activity: Unremarkable Speech: Logical/coherent, Soft, Slow Level of Consciousness: Alert Mood: Depressed Affect: Sad, Flat Anxiety Level: None Thought Processes: Coherent Judgement: Partial Orientation: Appropriate for developmental age Obsessive Compulsive Thoughts/Behaviors: None  Cognitive Functioning Concentration: Decreased Memory: Recent Intact Is patient IDD: No Insight: Fair Impulse Control: Fair Appetite: Poor Have you had any weight changes? : No Change Sleep: Decreased Vegetative Symptoms: None  ADLScreening [facility] Assessment Services) Patient's cognitive ability adequate to safely complete daily activities?: Yes Patient able to express need for assistance with ADLs?: Yes Independently performs ADLs?: Yes (appropriate for developmental age)  Prior Inpatient Therapy Prior Inpatient Therapy: No  Prior Outpatient Therapy Prior Outpatient Therapy: Yes Prior Therapy Dates: Current Prior Therapy Facilty/Provider(s): [facility] Reason for Treatment: Depression, anxiety Does patient have an ACCT team?: No Does patient have Intensive In-House Services?  : No Does patient have [facility] services? : No Does patient have P4CC services?: No  ADL Screening (condition at time of admission) Patient's cognitive ability adequate to safely complete daily activities?: Yes Is the patient deaf or have difficulty hearing?: No Does the patient have difficulty seeing, even when wearing glasses/contacts?: No Does the patient have difficulty concentrating, remembering, or making decisions?: No Patient able to express need for assistance with ADLs?: Yes Does the patient have difficulty dressing or bathing?: No Independently performs ADLs?: Yes (appropriate for developmental age) Does the patient have difficulty walking or climbing stairs?: No Weakness of Legs: None Weakness of Arms/Hands: None  Home Assistive Devices/Equipment Home Assistive Devices/Equipment: None    Abuse/Neglect Assessment (Assessment to be complete while patient is alone) Abuse/Neglect Assessment Can Be Completed: (Patient denied a history of abuse)     Advance Directives (For Healthcare) Does Patient Have a Medical Advance Directive?: No Would patient like information on creating a medical advance directive?: No - Patient declined          Disposition:  Disposition Initial Assessment Completed for this Encounter: Yes  On Site Evaluation by:   Reviewed with Physician:    Justice Deeds 07/22/2019 9:56 PM

## 2019-07-22 NOTE — ED Triage Notes (Signed)
Pt to ED reporting increased sadness recently with the anniverory or her friend's death, her boyfriend broke up with her and a new school year with increased stress. Pt has been having thoughts about if she no longer was around to deal with the pain. Pt denies a plan for suicide but states, " I think I waited too long to talk to anyone I am just so sad." Pt tearful in triage.

## 2019-07-22 NOTE — ED Notes (Signed)
TTS in progress awaiting psych consult with plan of care.

## 2019-07-22 NOTE — ED Notes (Signed)
Assumed care of patient patient reports feeling sad for a long while, now also having SI thoughts. 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 Patient states parents are aware of her mental state, bought her to hospital to seek help today. Patient shared not taking any and has never taken any anti psych medications in past but she did see a Scientist, physiological. Patient states stop seeing this counselor when she started college. Patient with no eye contact when speaking, teary with very soft voice. Safety maintaned, awaiting psych eval and plan of care.

## 2019-07-22 NOTE — ED Notes (Signed)
poc urine preg negative

## 2019-07-22 NOTE — ED Notes (Signed)
Pt dressed out in burgundy scrubs.  Belongings, which include shoes socks, jeans, bra, underwear, tank top zip up hoodie, cell phone, bagged, labelled and placed at nurses station.  Pt ambulated to room 22

## 2019-07-22 NOTE — ED Provider Notes (Signed)
[facility] Emergency Department Provider Note  ____________________________________________   First MD Initiated Contact with Patient 07/22/19 1956     (approximate)  I have reviewed the triage vital signs and the nursing notes.  History  Chief Complaint Depression and Suicidal    HPI Jill Osborne is a 18 y.o. female with a history of depression, not on any medications currently, who presents to the emergency department for worsening depression.  This is in the setting of multiple stressors, including the anniversary of her friend's death, recent break-up with her boyfriend, and a new school year.  She denies any frank suicidal ideation, but is having thoughts about not being alive/if she was no longer around to deal with the pain.  She presents voluntarily, is tearful, but cooperative, and is seeking assistance with her depression.  She denies any alcohol or drug use.   Past Medical Hx Past Medical History:  Diagnosis Date  . Asthma   . Depression   . Lumbar herniated disc   . Tetanus     Problem List Patient Active Problem List   Diagnosis Date Noted  . Tetanus 03/08/2019    Past Surgical Hx Past Surgical History:  Procedure Laterality Date  . LAPAROSCOPIC APPENDECTOMY N/A 07/15/2016   Procedure: APPENDECTOMY LAPAROSCOPIC;  Surgeon: Clayburn Pert, MD;  Location: [facility] ORS;  Service: General;  Laterality: N/A;    Medications Prior to Admission medications   Not on File    Allergies Shellfish allergy and Eggs or egg-derived products  Family Hx Family History  Problem Relation Age of Onset  . Healthy Mother   . Healthy Father     Social Hx Social History   Tobacco Use  . Smoking status: Never Smoker  . Smokeless tobacco: Never Used  Substance Use Topics  . Alcohol use: No  . Drug use: No     Review of Systems  Constitutional: Negative for fever, chills. Eyes: Negative for visual changes. ENT: Negative for sore  throat. Cardiovascular: Negative for chest pain. Respiratory: Negative for shortness of breath. Gastrointestinal: Negative for nausea, vomiting.  Genitourinary: Negative for dysuria. Musculoskeletal: Negative for leg swelling. Skin: Negative for rash. Neurological: Negative for for headaches.   Physical Exam  Vital Signs: ED Triage Vitals  Enc Vitals Group     BP 07/22/19 1913 123/61     Pulse Rate 07/22/19 1913 81     Resp 07/22/19 1913 16     Temp 07/22/19 1913 98.8 F (37.1 C)     Temp Source 07/22/19 1913 Oral     SpO2 07/22/19 1913 97 %     Weight 07/22/19 1911 162 lb 0.6 oz (73.5 kg)     Height 07/22/19 1911 5' (1.524 m)     Head Circumference --      Peak Flow --      Pain Score 07/22/19 1911 0     Pain Loc --      Pain Edu? --      Excl. in [place]? --     Constitutional: Alert and oriented.  Head: Normocephalic. Atraumatic. Eyes: Conjunctivae clear. Sclera anicteric. Nose: No congestion. No rhinorrhea. Mouth/Throat: Mucous membranes are moist.  Neck: No stridor.   Cardiovascular: Normal rate. Extremities well perfused. Respiratory: Normal respiratory effort.  Gastrointestinal:  Non-distended.  Musculoskeletal: No lower extremity edema. No deformities. Neurologic:  Normal speech and language. No gross focal neurologic deficits are appreciated.  Skin: Skin is warm, dry and intact. No rash noted. Psychiatric: Tearful.  Appropriate.  Calm and cooperative.  EKG  N/A   Radiology  N/A  Procedures  Procedure(s) performed (including critical care):  Procedures   Initial Impression / Assessment and Plan / ED Course  18 y.o. female who presents the emergency department for worsening depression in the setting of multiple life stressors.  As above.  She is not currently taking any psychiatric medications.  She presents voluntarily and is calm and cooperative on arrival.  She desires assistance with her symptoms.  We will obtain basic labs, as well as  psychiatry and TTS consult. No evidence of underlying infectious, toxicologic, or metabolic etiology of her symptoms.    Final Clinical Impression(s) / ED Diagnosis  Final diagnoses:  Complaint of feeling depressed       Note:  This document was prepared using Dragon voice recognition software and may include unintentional dictation errors.   Miguel Aschoff., MD 07/22/19 [phone]

## 2019-07-22 NOTE — ED Notes (Signed)
Pt stated she had not eaten all day so snack tray was given.

## 2019-07-23 ENCOUNTER — Inpatient Hospital Stay
Admission: AD | Admit: 2019-07-23 | Discharge: 2019-07-26 | DRG: 885 | Disposition: A | Discharge status: Home / Self Care | Payer: BC Managed Care – PPO | Source: Intra-hospital | Attending: Psychiatry | Admitting: Psychiatry

## 2019-07-23 DIAGNOSIS — J45909 Unspecified asthma, uncomplicated: Secondary | ICD-10-CM | POA: Diagnosis present

## 2019-07-23 DIAGNOSIS — F329 Major depressive disorder, single episode, unspecified: Secondary | ICD-10-CM | POA: Insufficient documentation

## 2019-07-23 DIAGNOSIS — F332 Major depressive disorder, recurrent severe without psychotic features: Secondary | ICD-10-CM | POA: Diagnosis not present

## 2019-07-23 DIAGNOSIS — G4700 Insomnia, unspecified: Secondary | ICD-10-CM | POA: Diagnosis present

## 2019-07-23 DIAGNOSIS — Z20828 Contact with and (suspected) exposure to other viral communicable diseases: Secondary | ICD-10-CM | POA: Diagnosis present

## 2019-07-23 DIAGNOSIS — G4710 Hypersomnia, unspecified: Secondary | ICD-10-CM | POA: Diagnosis present

## 2019-07-23 DIAGNOSIS — Z79899 Other long term (current) drug therapy: Secondary | ICD-10-CM

## 2019-07-23 DIAGNOSIS — F419 Anxiety disorder, unspecified: Secondary | ICD-10-CM | POA: Diagnosis present

## 2019-07-23 DIAGNOSIS — R45851 Suicidal ideations: Secondary | ICD-10-CM | POA: Diagnosis present

## 2019-07-23 DIAGNOSIS — F322 Major depressive disorder, single episode, severe without psychotic features: Secondary | ICD-10-CM | POA: Insufficient documentation

## 2019-07-23 HISTORY — DX: Major depressive disorder, recurrent severe without psychotic features: F33.2

## 2019-07-23 LAB — SARS CORONAVIRUS 2 BY RT PCR (HOSPITAL ORDER, PERFORMED IN ~~LOC~~ HOSPITAL LAB): SARS Coronavirus 2: NEGATIVE

## 2019-07-23 LAB — POCT PREGNANCY, URINE: Preg Test, Ur: NEGATIVE

## 2019-07-23 MED ORDER — ACETAMINOPHEN 325 MG PO TABS: 650.0000 mg | ORAL_TABLET | Freq: Four times a day (QID) | ORAL | Status: DC | PRN

## 2019-07-23 MED ORDER — MAGNESIUM HYDROXIDE 400 MG/5ML PO SUSP: 30.0000 mL | Freq: Every day | ORAL | Status: DC | PRN

## 2019-07-23 MED ORDER — ALUM & MAG HYDROXIDE-SIMETH 200-200-20 MG/5ML PO SUSP: 30.0000 mL | ORAL | Status: DC | PRN

## 2019-07-23 MED ORDER — HYDROXYZINE HCL 25 MG PO TABS
25.0000 mg | ORAL_TABLET | Freq: Four times a day (QID) | ORAL | Status: DC | PRN
  Filled 2019-07-23: qty 1

## 2019-07-23 NOTE — ED Notes (Signed)
BEHAVIORAL HEALTH ROUNDING Patient sleeping: No. Patient alert and oriented: yes Behavior appropriate: Yes.  ; If no, describe:  Nutrition and fluids offered: yes Toileting and hygiene offered: Yes  Sitter present: q15 minute observations and security monitoring Law enforcement present: Yes    

## 2019-07-23 NOTE — BH Assessment (Signed)
Patient is to be admitted to [facility] by Psychiatric Nurse Practitioner Caroline Sauger.  Attending Physician will be Dr. Weber Cooks.   Patient has been assigned to room 310, by [facility] Charge Nurse Bladen.   Intake Paper Work has been signed and placed on patient chart.   ER staff is aware of the admission:  Lattie Haw, ER Secretary    Dr. Corky Downs, ER MD   Donneta Romberg, Patient's Nurse   Butch Penny, Patient Access.

## 2019-07-23 NOTE — Progress Notes (Signed)
Admission Note: From  Jadaka RN from ER  D: Pt appeared depressed  With  a flat affect.  Pt  denies SI / AVH at this time. 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  Voice of trouble sleeping.  Patient stated the last time she was suicidal  Was when she was 55. Pt is redirectable and cooperative with assessment.      History Asthma Depression Allergies  Shellfish eggs  A: Pt admitted to unit per protocol, skin assessment and search done and no contraband found.  Pt  educated on therapeutic milieu rules. Pt was introduced to milieu by nursing staff.    R: Pt was receptive to education about the milieu .  15 min safety checks started. Probation officer offered support

## 2019-07-23 NOTE — ED Notes (Signed)
Patient to be admitted after shift change to room 310 per [facility] TTS.

## 2019-07-23 NOTE — Tx Team (Signed)
Initial Treatment Plan 07/23/2019 3:33 PM ANAIH [name]:[id number]    PATIENT STRESSORS: Financial difficulties Marital or family conflict   PATIENT STRENGTHS: Curator fund of knowledge Motivation for treatment/growth Supportive family/friends   PATIENT IDENTIFIED PROBLEMS: Depression  Financial issues  Relationship issues                 DISCHARGE CRITERIA:  Ability to meet basic life and health needs Improved stabilization in mood, thinking, and/or behavior Need for constant or close observation no longer present Reduction of life-threatening or endangering symptoms to within safe limits  PRELIMINARY DISCHARGE PLAN: Outpatient therapy Return to previous living arrangement Return to previous work or school arrangements  PATIENT/FAMILY INVOLVEMENT: This treatment plan has been presented to and reviewed with the patient, Jill Osborne. The patient has been given the opportunity to ask questions and make suggestions.  Heber Hoog, RN 07/23/2019, 3:33 PM

## 2019-07-23 NOTE — ED Provider Notes (Addendum)
-----------------------------------------   6:07 AM on 07/23/2019 -----------------------------------------   Blood pressure 123/61, pulse 81, temperature 98.8 F (37.1 C), temperature source Oral, resp. rate 16, height 5' (1.524 m), weight 73.5 kg, SpO2 97 %.  The patient is sleeping at this time.  There have been no acute events since the last update.  Awaiting disposition plan from Behavioral Medicine.   Paulette Blanch, MD 07/23/19 [phone]   ----------------------------------------- 6:32 AM on 07/23/2019 -----------------------------------------  I am told patient will be admitted to the behavioral medicine unit after shift change.   Paulette Blanch, MD 07/23/19 [phone]

## 2019-07-23 NOTE — Consult Note (Signed)
[facility] Face-to-Face Psychiatry Consult   Reason for Consult: Depression and suicidal Referring Physician: Dr. Joan Mayans  Patient Identification: Jill Osborne MRN:  [id number] Principal Diagnosis: <principal problem not specified> Diagnosis:  Active Problems:   MDD (major depressive disorder), recurrent severe, without psychosis ([place])   Total Time spent with patient: 45 minutes  Subjective: "My parents were concerned about me driving around and trying to think about what we have to do with so it does not hurt." Jill Osborne is a 18 y.o. female patient presented to [facility] ED via POV voluntary.  Per the ED triage nursing note, the patient was brought to the ED due to her reporting increased sadness recently and dealing with her friend's death anniversary. She voiced her boyfriend broke up with her, and a new school year with increased stress. The patient has been having thoughts about if she no longer was around to deal with the pain. The patient denies having a suicide plan but states, " I think I waited too long to talk to anyone. I am just so sad."  The patient has been very emotional in triage and throughout her assessment with other providers.  The patient states she has an appointment to meet with a therapist on Tuesday, 07/23/2019.  The patient admits to allowing herself to be this depressed for so long.  She is currently a Ship broker at [employer].  She reports symptoms of anxiety.  The patient shared that she cries and her heart races and she gets a ringing in her ears.  She reports depressive symptoms.  She shared that she feels a heavy weight on her chest. She has had difficulty sleeping waking up almost every hour. The patient was seen face-to-face by this provider; chart reviewed and consulted with Dr. Joan Mayans on 07/22/2019 due to the patient's care. It was discussed with the EDP that the patient does meet the criteria to be admitted to the psychiatric inpatient unit.  The patient is alert and oriented  x4, calm, cooperative, very emotional, and mood-congruent with affect on evaluation.  The patient does not appear to be responding to internal or external stimuli. Neither is the patient presenting with any delusional thinking. The patient denies auditory or visual hallucinations. The patient admits to suicidal ideation without a plan but denies homicidal or self-harm ideations. The patient is not presenting with any psychotic or paranoid behaviors. During an encounter with the patient, she was able to answer questions appropriately. Collateral was not obtained; this provider made several attempts to call the patient's mom Ms.Payton Emerald [phone]), and left HIPPA appropriate voicemail message for mom to return the call. Plan: The patient is a safety risk to self and does require psychiatric inpatient admission for stabilization and treatment.   HPI: Per Dr. Joan Mayans; Jill Osborne is a 18 y.o. female with a history of depression, not on any medications currently, who presents to the emergency department for worsening depression.  This is in the setting of multiple stressors, including the anniversary of her friend's death, recent break-up with her boyfriend, and a new school year.  She denies any frank suicidal ideation, but is having thoughts about not being alive/if she was no longer around to deal with the pain.  She presents voluntarily, is tearful, but cooperative, and is seeking assistance with her depression.  She denies any alcohol or drug use  Past Psychiatric History:  Depression  Risk to Self: Suicidal Ideation: Yes-Currently Present Suicidal Intent: No Is patient at risk for suicide?: Yes  Suicidal Plan?: No Access to Means: No What has been your use of drugs/alcohol within the last 12 months?: Denied use How many times?: 1 Other Self Harm Risks: Denied Triggers for Past Attempts: Unknown Intentional Self Injurious Behavior: None Risk to Others: Homicidal Ideation: No Thoughts  of Harm to Others: No Current Homicidal Intent: No Current Homicidal Plan: No Access to Homicidal Means: No Identified Victim: None identified History of harm to others?: No Assessment of Violence: None Noted Violent Behavior Description: Denied by patient Does patient have access to weapons?: No Criminal Charges Pending?: No Does patient have a court date: No Prior Inpatient Therapy: Prior Inpatient Therapy: No Prior Outpatient Therapy: Prior Outpatient Therapy: Yes Prior Therapy Dates: Current Prior Therapy Facilty/Provider([facility]): [facility] Reason for Treatment: Depression, anxiety Does patient have an ACCT team?: No Does patient have Intensive In-House Services?  : No Does patient have [facility] services? : No Does patient have P4CC services?: No  Past Medical History:  Past Medical History:  Diagnosis Date  . Asthma   . Depression   . Lumbar herniated disc   . Tetanus     Past Surgical History:  Procedure Laterality Date  . LAPAROSCOPIC APPENDECTOMY N/A 07/15/2016   Procedure: APPENDECTOMY LAPAROSCOPIC;  Surgeon: Ricarda Frame, MD;  Location: [facility] ORS;  Service: General;  Laterality: N/A;   Family History:  Family History  Problem Relation Age of Onset  . Healthy Mother   . Healthy Father    Family Psychiatric  History: No pertinent family psychiatric history. Social History:  Social History   Substance and Sexual Activity  Alcohol Use No     Social History   Substance and Sexual Activity  Drug Use No    Social History   Socioeconomic History  . Marital status: Single    Spouse name: Not on file  . Number of children: Not on file  . Years of education: Not on file  . Highest education level: Not on file  Occupational History  . Not on file  Social Needs  . Financial resource strain: Not on file  . Food insecurity    Worry: Not on file    Inability: Not on file  . Transportation needs    Medical: Not on file    Non-medical: Not on file  Tobacco  Use  . Smoking status: Never Smoker  . Smokeless tobacco: Never Used  Substance and Sexual Activity  . Alcohol use: No  . Drug use: No  . Sexual activity: Yes    Partners: Male    Birth control/protection: None, Condom  Lifestyle  . Physical activity    Days per week: Not on file    Minutes per session: Not on file  . Stress: Not on file  Relationships  . Social Musician on phone: Not on file    Gets together: Not on file    Attends religious service: Not on file    Active member of club or organization: Not on file    Attends meetings of clubs or organizations: Not on file    Relationship status: Not on file  Other Topics Concern  . Not on file  Social History Narrative   Single.   Student at [employer], [employer].   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   Enjoys reading, spending time with friends.   Additional Social History:    Allergies:   Allergies  Allergen Reactions  . Shellfish Allergy Hives  . Eggs Or Egg-Derived Products Nausea  And Vomiting    Labs:  Results for orders placed or performed during the hospital encounter of 07/22/19 (from the past 48 hour(s))  Comprehensive metabolic panel     Status: None   Collection Time: 07/22/19  7:23 PM  Result Value Ref Range   Sodium 137 135 - 145 mmol/L   Potassium 3.7 3.5 - 5.1 mmol/L   Chloride 102 98 - 111 mmol/L   CO2 25 22 - 32 mmol/L   Glucose, Bld 86 70 - 99 mg/dL   BUN 9 6 - 20 mg/dL   Creatinine, Ser 4.09 0.44 - 1.00 mg/dL   Calcium 9.6 8.9 - 81.1 mg/dL   Total Protein 8.1 6.5 - 8.1 g/dL   Albumin 4.7 3.5 - 5.0 g/dL   AST 26 15 - 41 U/L   ALT 30 0 - 44 U/L   Alkaline Phosphatase 56 38 - 126 U/L   Total Bilirubin 0.9 0.3 - 1.2 mg/dL   GFR calc non Af Amer >60 >60 mL/min   GFR calc Af Amer >60 >60 mL/min   Anion gap 10 5 - 15    Comment: Performed at [facility], [address] [place], [place] [address]  Ethanol     Status: None   Collection Time: 07/22/19  7:23 PM  Result Value Ref  Range   Alcohol, Ethyl (B) <10 <10 mg/dL    Comment: (NOTE) Lowest detectable limit for serum alcohol is 10 mg/dL. For medical purposes only. Performed at [facility], [address] [place], [place] [address]   Salicylate level     Status: None   Collection Time: 07/22/19  7:23 PM  Result Value Ref Range   Salicylate Lvl <7.0 2.8 - 30.0 mg/dL    Comment: Performed at [facility], [address] [place], [place] [address]  Acetaminophen level     Status: Abnormal   Collection Time: 07/22/19  7:23 PM  Result Value Ref Range   Acetaminophen (Tylenol), Serum <10 (L) 10 - 30 ug/mL    Comment: (NOTE) Therapeutic concentrations vary significantly. A range of 10-30 ug/mL  may be an effective concentration for many patients. However, some  are best treated at concentrations outside of this range. Acetaminophen concentrations >150 ug/mL at 4 hours after ingestion  and >50 ug/mL at 12 hours after ingestion are often associated with  toxic reactions. Performed at [facility], [address] [place], [place] [address]   cbc     Status: None   Collection Time: 07/22/19  7:23 PM  Result Value Ref Range   WBC 7.4 4.0 - 10.5 K/uL   RBC 4.55 3.87 - 5.11 MIL/uL   Hemoglobin 13.8 12.0 - 15.0 g/dL   HCT 96.2 95.2 - 84.1 %   MCV 89.9 80.0 - 100.0 fL   MCH 30.3 26.0 - 34.0 pg   MCHC 33.7 30.0 - 36.0 g/dL   RDW 32.4 40.1 - 02.7 %   Platelets 269 150 - 400 K/uL   nRBC 0.0 0.0 - 0.2 %    Comment: Performed at [facility], [address] [place], [place] [address]  Urine Drug Screen, Qualitative     Status: None   Collection Time: 07/22/19  7:23 PM  Result Value Ref Range   Tricyclic, Ur Screen NONE DETECTED NONE DETECTED   Amphetamines, Ur Screen NONE DETECTED NONE DETECTED   MDMA (Ecstasy)Ur Screen NONE DETECTED NONE DETECTED   Cocaine Metabolite,Ur [place] NONE DETECTED NONE DETECTED  Opiate, Ur Screen NONE DETECTED NONE DETECTED   Phencyclidine  (PCP) Ur S NONE DETECTED NONE DETECTED   Cannabinoid 50 Ng, Ur  NONE DETECTED NONE DETECTED   Barbiturates, Ur Screen NONE DETECTED NONE DETECTED   Benzodiazepine, Ur Scrn NONE DETECTED NONE DETECTED   Methadone Scn, Ur NONE DETECTED NONE DETECTED    Comment: (NOTE) Tricyclics + metabolites, urine    Cutoff 1000 ng/mL Amphetamines + metabolites, urine  Cutoff 1000 ng/mL MDMA (Ecstasy), urine              Cutoff 500 ng/mL Cocaine Metabolite, urine          Cutoff 300 ng/mL Opiate + metabolites, urine        Cutoff 300 ng/mL Phencyclidine (PCP), urine         Cutoff 25 ng/mL Cannabinoid, urine                 Cutoff 50 ng/mL Barbiturates + metabolites, urine  Cutoff 200 ng/mL Benzodiazepine, urine              Cutoff 200 ng/mL Methadone, urine                   Cutoff 300 ng/mL The urine drug screen provides only a preliminary, unconfirmed analytical test result and should not be used for non-medical purposes. Clinical consideration and professional judgment should be applied to any positive drug screen result due to possible interfering substances. A more specific alternate chemical method must be used in order to obtain a confirmed analytical result. Gas chromatography / mass spectrometry (GC/MS) is the preferred confirmat ory method. Performed at [facility], [address] [place], [place] [id number]   SARS Coronavirus 2 [facility](Hospital order, Performed in [facility] hospital lab) Nasopharyngeal Nasopharyngeal Swab     Status: None   Collection Time: 07/23/19 12:23 AM   Specimen: Nasopharyngeal Swab  Result Value Ref Range   SARS Coronavirus 2 NEGATIVE NEGATIVE    Comment: (NOTE) If result is NEGATIVE SARS-CoV-2 target nucleic acids are NOT DETECTED. The SARS-CoV-2 RNA is generally detectable in upper and lower  respiratory specimens during the acute phase of infection. The lowest  concentration of SARS-CoV-2 viral copies this assay can detect is 250  copies / mL. A  negative result does not preclude SARS-CoV-2 infection  and should not be used as the sole basis for treatment or other  patient management decisions.  A negative result may occur with  improper specimen collection / handling, submission of specimen other  than nasopharyngeal swab, presence of viral mutation(s) within the  areas targeted by this assay, and inadequate number of viral copies  (<250 copies / mL). A negative result must be combined with clinical  observations, patient history, and epidemiological information. If result is POSITIVE SARS-CoV-2 target nucleic acids are DETECTED. The SARS-CoV-2 RNA is generally detectable in upper and lower  respiratory specimens dur ing the acute phase of infection.  Positive  results are indicative of active infection with SARS-CoV-2.  Clinical  correlation with patient history and other diagnostic information is  necessary to determine patient infection status.  Positive results do  not rule out bacterial infection or co-infection with other viruses. If result is PRESUMPTIVE POSTIVE SARS-CoV-2 nucleic acids MAY BE PRESENT.   A presumptive positive result was obtained on the submitted specimen  and confirmed on repeat testing.  While 2019 novel coronavirus  (SARS-CoV-2) nucleic acids may be present in the submitted sample  additional confirmatory testing  may be necessary for epidemiological  and / or clinical management purposes  to differentiate between  SARS-CoV-2 and other Sarbecovirus currently known to infect humans.  If clinically indicated additional testing with an alternate test  methodology [phone]) is advised. The SARS-CoV-2 RNA is generally  detectable in upper and lower respiratory sp ecimens during the acute  phase of infection. The expected result is Negative. Fact Sheet for Patients:  BoilerBrush.com.cy Fact Sheet for Healthcare Providers: https://pope.com/ This test is not  yet approved or cleared by the [place] FDA and has been authorized for detection and/or diagnosis of SARS-CoV-2 by FDA under an Emergency Use Authorization (EUA).  This EUA will remain in effect (meaning this test can be used) for the duration of the COVID-19 declaration under Section 564(b)(1) of the Act, 21 U.S.C. section 360bbb-3(b)(1), unless the authorization is terminated or revoked sooner. Performed at [facility], [address], [place], [place] [address]     Current Facility-Administered Medications  Medication Dose Route Frequency Provider Last Rate Last Dose  . Levonorgestrel IUD 19.5 mg  1 each Intrauterine Continuous Reva Bores, MD   19.5 mg at 04/26/18 1030   No current outpatient medications on file.    Musculoskeletal: Strength & Muscle Tone: within normal limits Gait & Station: normal Patient leans: N/A  Psychiatric Specialty Exam: Physical Exam  ROS  Blood pressure 123/61, pulse 81, temperature 98.8 F (37.1 C), temperature source Oral, resp. rate 16, height 5' (1.524 m), weight 73.5 kg, SpO2 97 %.Body mass index is 31.65 kg/m.  General Appearance: Casual and Guarded  Eye Contact:  Good  Speech:  Clear and Coherent  Volume:  Decreased  Mood:  Anxious, Depressed and Hopeless  Affect:  Congruent, Depressed and Tearful  Thought Process:  Coherent  Orientation:  Full (Time, Place, and Person)  Thought Content:  Logical  Suicidal Thoughts:  Yes.  without intent/plan  Homicidal Thoughts:  No  Memory:  Immediate;   Good Recent;   Good Remote;   Good  Judgement:  Poor  Insight:  Lacking  Psychomotor Activity:  Decreased  Concentration:  Concentration: Good and Attention Span: Good  Recall:  Good  Fund of Knowledge:  Good  Language:  Good  Akathisia:  Negative  Handed:  Right  AIMS (if indicated):     Assets:  Communication Skills Desire for Improvement Social Support  ADL's:  Intact  Cognition:  WNL  Sleep:   Not well      Treatment Plan Summary: Plan Patient meets criteria for psychiatric inpatient admission.  Disposition: Recommend psychiatric Inpatient admission when medically cleared. Supportive therapy provided about ongoing stressors.  Gillermo Murdoch, NP 07/23/2019 2:14 AM

## 2019-07-23 NOTE — Plan of Care (Signed)
New admission.  Problem: Education: Goal: Knowledge of [place] [facility] Education information/materials will improve Outcome: Not Progressing Goal: Emotional status will improve Outcome: Not Progressing Goal: Mental status will improve Outcome: Not Progressing Goal: Verbalization of understanding the information provided will improve Outcome: Not Progressing   Problem: Health Behavior/Discharge Planning: Goal: Compliance with treatment plan for underlying cause of condition will improve Outcome: Not Progressing   Problem: Safety: Goal: Periods of time without injury will increase Outcome: Not Progressing   Problem: Education: Goal: Utilization of techniques to improve thought processes will improve Outcome: Not Progressing   Problem: Coping: Goal: Coping ability will improve Outcome: Not Progressing Goal: Will verbalize feelings Outcome: Not Progressing   Problem: Health Behavior/Discharge Planning: Goal: Compliance with therapeutic regimen will improve Outcome: Not Progressing

## 2019-07-23 NOTE — ED Notes (Signed)
Pt asleep, breakfast tray placed in rm.  

## 2019-07-23 NOTE — ED Notes (Signed)
Patient appears to have a flat affect, and is sad, she says now she wants to go home due to the noise in the quad area. Staff informed her that she is here to get assistance

## 2019-07-24 LAB — TSH: TSH: 1.4 u[IU]/mL (ref 0.350–4.500)

## 2019-07-24 MED ORDER — VENLAFAXINE HCL ER 37.5 MG PO CP24
37.5000 mg | ORAL_CAPSULE | Freq: Every day | ORAL | Status: DC
  Administered 2019-07-24 – 2019-07-25 (×2): 37.5 mg via ORAL
  Filled 2019-07-24 (×2): qty 1

## 2019-07-24 NOTE — Progress Notes (Signed)
D: Major Depression / Anxiety  A: Patient stated slept good last night .Stated appetite fair and energy level low . Stated concentration  good . Stated on Depression scale 6 , hopeless 2 and anxiety 10.( low 0-10 high) Denies suicidal  homicidal ideations  .  No auditory hallucinations  No pain concerns . Appropriate ADL'S. Interacting with peers and staff. Working on Estate agent and anxiety . Compliant  with medication . Nutritional  appetite good .  No safety concerns . Encourage  group participation looking at [place]  condition. And improving  thought process  Staff redirect  patient with available resources . Interactions  with patient . Denies suicidal ideations  Encourage patient participation with unit programming . Instruction  Given on  Medication , verbalize understanding.  R: Voice no other concerns. Staff continue to monitor

## 2019-07-24 NOTE — BHH Suicide Risk Assessment (Signed)
[place] INPATIENT:  Family/Significant Other Suicide Prevention Education  Suicide Prevention Education:  Patient Refusal for Family/Significant Other Suicide Prevention Education: The patient Jill Osborne has refused to provide written consent for family/significant other to be provided Family/Significant Other Suicide Prevention Education during admission and/or prior to discharge.  Physician notified.  SPE completed with pt, as pt refused to consent to family contact. SPI pamphlet provided to pt and pt was encouraged to share information with support network, ask questions, and talk about any concerns relating to SPE. Pt denies access to guns/firearms and verbalized understanding of information provided. Mobile Crisis information also provided to pt.   Rozann Lesches 07/24/2019, 11:16 AM

## 2019-07-24 NOTE — Progress Notes (Signed)
D - Patient was on the phone upon arrival to the unit. Patient was pleasant during assessment denying SI/HI/AVH, pain, anxiety and depression. Patient was observed interacting appropriately with staff and peers on the unit.   A - Patient didn't have any scheduled medications this evening. Patient given education. Patient given support and encouragement to be active in her treatment plan. Patient informed to let staff know if there are any issues or problems on the unit.   R - Patient being monitored Q 15 minutes for safety per unit protocol. Patient remains safe on the unit.

## 2019-07-24 NOTE — Plan of Care (Signed)
Patient denies SI/HI/AVH, and is hopeful she will be leaving in a day or two.   Problem: Education: Goal: Emotional status will improve Outcome: Progressing Goal: Mental status will improve Outcome: Progressing

## 2019-07-24 NOTE — Progress Notes (Signed)
Recreation Therapy Notes  INPATIENT RECREATION THERAPY ASSESSMENT  Patient Details Name: Jill Osborne MRN: [id number] DOB: Jan 22, 2001 Today's Date: 07/24/2019       Information Obtained From: Patient  Able to Participate in Assessment/Interview: Yes  Patient Presentation: Responsive  Reason for Admission (Per Patient): Active Symptoms  Patient Stressors: School  Coping Skills:   Music  Leisure Interests (2+):  Music - Listen, Individual - Reading  Frequency of Recreation/Participation: Monthly  Awareness of Community Resources:     [employer]:     Current Use:    If no, Barriers?:    Expressed Interest in [place]:    [place] of Residence:  [place]  Patient Main Form of Transportation: Musician  Patient Strengths:  Sharing  Patient Identified Areas of Improvement:  School  Patient Goal for Hospitalization:  Getting resources  Current SI (including self-harm):  No  Current HI:  No  Current AVH: No  Staff Intervention Plan: Group Attendance, Collaborate with Interdisciplinary Treatment Team  Consent to Intern Participation: N/A  Merrilee Ancona 07/24/2019, 2:26 PM

## 2019-07-24 NOTE — [facility] Suicide Risk Assessment (Signed)
[facility] Admission Suicide Risk Assessment   Nursing information obtained from:  Patient Demographic factors:  NA Current Mental Status:  NA Loss Factors:  Financial problems / change in socioeconomic status Historical Factors:  NA Risk Reduction Factors:  NA  Total Time spent with patient: 30 minutes Principal Problem: <principal problem not specified> Diagnosis:  Active Problems:   MDD (major depressive disorder), severe (HCC)  Subjective Data: Patient seen chart reviewed.  This is a 18 year old woman with a history of depression brought to the hospital because of suicidal ideation.  On interview today the patient says that her mother discovered that she, the patient, was considering killing herself.  She says that she was thinking about trying to find a plan for a way to kill her self that would not hurt although she denies to me that she had had a specific plan in mind or that she had actually done anything to hurt herself yet.  Describes depressed mood.  Currently has a therapist but not on any medication.  Does not appear to have psychotic symptoms.  Very flat and withdrawn and appears depressed.  Continued Clinical Symptoms:  Alcohol Use Disorder Identification Test Final Score (AUDIT): 0 The "Alcohol Use Disorders Identification Test", Guidelines for Use in Primary Care, Second Edition.  World Science writer [facility]). Score between 0-7:  no or low risk or alcohol related problems. Score between 8-15:  moderate risk of alcohol related problems. Score between 16-19:  high risk of alcohol related problems. Score 20 or above:  warrants further diagnostic evaluation for alcohol dependence and treatment.   CLINICAL FACTORS:   Depression:   Anhedonia Hopelessness   Musculoskeletal: Strength & Muscle Tone: within normal limits Gait & Station: normal Patient leans: N/A  Psychiatric Specialty Exam: Physical Exam  Nursing note and vitals reviewed. Constitutional: She appears  well-developed and well-nourished.  HENT:  Head: Normocephalic and atraumatic.  Eyes: Pupils are equal, round, and reactive to light. Conjunctivae are normal.  Neck: Normal range of motion.  Cardiovascular: Regular rhythm and normal heart sounds.  Respiratory: Effort normal.  GI: Soft.  Musculoskeletal: Normal range of motion.  Neurological: She is alert.  Skin: Skin is warm and dry.  Psychiatric: Judgment normal. Her affect is blunt. Her speech is delayed. She is slowed and withdrawn. Cognition and memory are impaired. She exhibits a depressed mood. She expresses suicidal ideation. She expresses no suicidal plans.    Review of Systems  Constitutional: Negative.   HENT: Negative.   Eyes: Negative.   Respiratory: Negative.   Cardiovascular: Negative.   Gastrointestinal: Negative.   Musculoskeletal: Negative.   Skin: Negative.   Neurological: Negative.   Psychiatric/Behavioral: Positive for depression and suicidal ideas. Negative for hallucinations, memory loss and substance abuse. The patient is nervous/anxious and has insomnia.     Blood pressure 120/73, pulse 88, temperature 98 F (36.7 C), temperature source Oral, resp. rate 18, height 5' (1.524 m), weight 70.8 kg, SpO2 99 %.Body mass index is 30.47 kg/m.  General Appearance: Casual  Eye Contact:  Good  Speech:  Slow  Volume:  Decreased  Mood:  Depressed and Dysphoric  Affect:  Congruent  Thought Process:  Coherent  Orientation:  Full (Time, Place, and Person)  Thought Content:  Logical  Suicidal Thoughts:  Yes.  with intent/plan  Homicidal Thoughts:  No  Memory:  Immediate;   Fair Recent;   Fair Remote;   Fair  Judgement:  Fair  Insight:  Fair  Psychomotor Activity:  Decreased  Concentration:  Concentration: Fair  Recall:  [employer] of Knowledge:  Fair  Language:  Fair  Akathisia:  No  Handed:  Right  AIMS (if indicated):     Assets:  Desire for Improvement Housing Physical Health Social Support  ADL's:   Intact  Cognition:  WNL  Sleep:  Number of Hours: 6.5      COGNITIVE FEATURES THAT CONTRIBUTE TO RISK:  Thought constriction (tunnel vision)    SUICIDE RISK:   Moderate:  Frequent suicidal ideation with limited intensity, and duration, some specificity in terms of plans, no associated intent, good self-control, limited dysphoria/symptomatology, some risk factors present, and identifiable protective factors, including available and accessible social support.  PLAN OF CARE: Patient able to agree that she is not thinking of hurting herself in the hospital and that she will speak to nurses or staff if she has more intrusive suicidal thoughts.  She is agreeable to treatment planning including medication and therapy in the hospital.  Continue 15-minute checks for now.  Reassess suicidality continually and prior to discharge.  I certify that inpatient services furnished can reasonably be expected to improve the patient's condition.   Alethia Berthold, MD 07/24/2019, 2:20 PM

## 2019-07-24 NOTE — Progress Notes (Signed)
Recreation Therapy Notes  Date: 07/24/2019  Time: 9:30 am   Location: Craft room   Behavioral response: N/A   Intervention Topic: Problem-Solving  Discussion/Intervention: Patient did not attend group.   Clinical Observations/Feedback:  Patient did not attend group.   Beverely Suen LRT/CTRS        Onyx Schirmer 07/24/2019 11:10 AM

## 2019-07-24 NOTE — [facility] Group Notes (Signed)
LCSW Group Therapy Note  07/24/2019 1:00 PM  Type of Therapy/Topic:  Group Therapy:  Emotion Regulation  Participation Level:  Did Not Attend   Description of Group:   The purpose of this group is to assist patients in learning to regulate negative emotions and experience positive emotions. Patients will be guided to discuss ways in which they have been vulnerable to their negative emotions. These vulnerabilities will be juxtaposed with experiences of positive emotions or situations, and patients will be challenged to use positive emotions to combat negative ones. Special emphasis will be placed on coping with negative emotions in conflict situations, and patients will process healthy conflict resolution skills.  Therapeutic Goals: 1. Patient will identify two positive emotions or experiences to reflect on in order to balance out negative emotions 2. Patient will label two or more emotions that they find the most difficult to experience 3. Patient will demonstrate positive conflict resolution skills through discussion and/or role plays  Summary of Patient Progress:  X  Therapeutic Modalities:   Cognitive Behavioral Therapy Feelings Identification Dialectical Behavioral Therapy  Alek Poncedeleon, MSW, LCSW 07/24/2019 12:30 PM  
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
## 2019-07-24 NOTE — Plan of Care (Signed)
  Problem: Education: Goal: Utilization of techniques to improve thought processes will improve Outcome: Progressing  Patient thought process is improving; her thoughts are organized and coherent.

## 2019-07-24 NOTE — Progress Notes (Signed)
Patient alert and oriented x 4, affect is flat and sad, she is tearful at times, isolates to self, not interacting with peers and staff appropriately was offered emotional support and encouragement, 15 minutes safety chjecks maintained will continue to monitor.

## 2019-07-24 NOTE — Plan of Care (Signed)
Working on Estate agent and anxiety . Compliant  with medication . Nutritional  appetite good .  No safety concerns . Encourage  group participation looking at [place]  condition. And improving  thought process  Staff redirect  patient with available resources . Interactions  with patient . Denies suicidal ideations  Emotional and mental status improving  Problem: Education: Goal: Knowledge of [place] [facility] Education information/materials will improve Outcome: Progressing Goal: Emotional status will improve Outcome: Progressing Goal: Mental status will improve Outcome: Progressing Goal: Verbalization of understanding the information provided will improve Outcome: Progressing   Problem: Health Behavior/Discharge Planning: Goal: Compliance with treatment plan for underlying cause of condition will improve Outcome: Progressing   Problem: Safety: Goal: Periods of time without injury will increase Outcome: Progressing   Problem: Education: Goal: Utilization of techniques to improve thought processes will improve Outcome: Progressing   Problem: Coping: Goal: Coping ability will improve Outcome: Progressing Goal: Will verbalize feelings Outcome: Progressing   Problem: Health Behavior/Discharge Planning: Goal: Compliance with therapeutic regimen will improve Outcome: Progressing

## 2019-07-24 NOTE — BHH Counselor (Signed)
Adult Comprehensive Assessment  Patient ID: Jill Osborne, female   DOB: [birth year], 18 y.o.   MRN: [id number]  Information Source: Information source: Patient  Current Stressors:  Patient states their primary concerns and needs for treatment are:: Pt reports "my parents brought me because I was going to kill myself".  Pt denies plan. Patient states their goals for this hospitilization and ongoing recovery are:: Pt reports "i'd like to leave". Educational / Learning stressors: Pt reports "I fell behind because I was really sad". Employment / Job issues: Pt reports "I am having a hard time finding a job." Family Relationships: Pt reports "my boyfriend dumped me and kicked me out". Financial / Lack of resources (include bankruptcy): Pt reports "I got bills". Housing / Lack of housing: Pt reports "I've been kicked out, so I am staying with my parents". Physical health (include injuries & life threatening diseases): Pt reports that she has asthama. Social relationships: Pt reports "I don't have a lot of friends." Substance abuse: Pt denies. Bereavement / Loss: Pt reports that Oct 8 is the 3 year anniversary of a friends death.  Living/Environment/Situation:  Living Arrangements: Parent Who else lives in the home?: Pt reports that she lives with her parents and her two siblings. How long has patient lived in current situation?: Pt reports that she recenlty moved back in after a break-up with her boyfriend and he "kicked me out". What is atmosphere in current home: Comfortable  Family History:  Marital status: Single Are you sexually active?: Yes What is your sexual orientation?: Heterosexual Has your sexual activity been affected by drugs, alcohol, medication, or emotional stress?: Pt dneies. Does patient have children?: No  Childhood History:  By whom was/is the patient raised?: Mother Description of patient's relationship with caregiver when they were a child: Pt reports "she was  strict but a good mom". Patient's description of current relationship with people who raised him/her: Pt reports "I think she understandss me better now". How were you disciplined when you got in trouble as a child/adolescent?: Pt eports "spanked at first then grounded". Does patient have siblings?: Yes Number of Siblings: 2 Description of patient's current relationship with siblings: Pt reports "they get on my nerves but I love them". Did patient suffer any verbal/emotional/physical/sexual abuse as a child?: No Did patient suffer from severe childhood neglect?: No Has patient ever been sexually abused/assaulted/raped as an adolescent or adult?: No Was the patient ever a victim of a crime or a disaster?: No Witnessed domestic violence?: No Has patient been effected by domestic violence as an adult?: No  Education:  Highest grade of school patient has completed: sophmore year of college Currently a student?: Yes Name of school: [employer] How long has the patient attended?: 3 years, patient is a Dealer disability?: No  Employment/Work Situation:   Employment situation: Unemployed What is the longest time patient has a held a job?: 1 year Where was the patient employed at that time?: [employer] Did You Receive Any Psychiatric Treatment/Services While in Passenger transport manager?: No(NA) Are There Guns or Other Weapons in [place]?: No  Financial Resources:   Museum/gallery curator resources: Support from parents / caregiver Does patient have a Programmer, applications or guardian?: No  Alcohol/Substance Abuse:   What has been your use of drugs/alcohol within the last 12 months?: Pt denies If attempted suicide, did drugs/alcohol play a role in this?: No(Pt reports that last suicide attempt was 2016 and pt planned on cutting her wrists.) Alcohol/Substance Abuse Treatment Hx: Denies  past history Has alcohol/substance abuse ever caused legal problems?: No  Social Support System:   Patient's Community Support  System: Good Describe Community Support System: Pt reports "my family". Type of faith/religion: Christianity How does patient's faith help to cope with current illness?: Pt reports "I pray for things and understanding".  Leisure/Recreation:   Leisure and Hobbies: Pt reports "I don't have any.  I used to enjoy reading but I haven't read anything that wasn't educational in a long time."  Strengths/Needs:   What is the patient's perception of their strengths?: Pt reports "I am compassionate.  I like to share things and I am a hardworker."  Discharge Plan:   Currently receiving community mental health services: Yes (From Whom)(Lori Carney, [phone]) Patient states concerns and preferences for aftercare planning are: Pt reports plans to continue with current provider. Patient states they will know when they are safe and ready for discharge when: Pt reports "I am ready now.  I like being alive. I just needed some of the stress to go away so I can work on myself." Does patient have access to transportation?: Yes Does patient have financial barriers related to discharge medications?: No Will patient be returning to same living situation after discharge?: Yes  Summary/Recommendations:   Summary and Recommendations (to be completed by the evaluator): Patient is a 18 year old female from [place], [place] [facility] [place].  She presents to the hospital with suicidal ideations.  She has a primary diagnosis of Major Depressive Disorder, Severe.  Recommendations include: crisis stabilization, therapeutic milieu, encourage group attendance and participation, medication management for detox/mood stabilization and development of comprehensive mental wellness/sobriety plan.  Harden Mo. 07/24/2019

## 2019-07-25 DIAGNOSIS — F332 Major depressive disorder, recurrent severe without psychotic features: Principal | ICD-10-CM

## 2019-07-25 MED ORDER — VENLAFAXINE HCL ER 75 MG PO CP24
75.0000 mg | ORAL_CAPSULE | Freq: Every day | ORAL | Status: DC
  Administered 2019-07-26: 75 mg via ORAL
  Filled 2019-07-25: qty 1

## 2019-07-25 NOTE — Progress Notes (Signed)
Recreation Therapy Notes   Date:07/25/2019  Time: 9:30 am  Location: Craft room  Behavioral response: Appropriate   Intervention Topic: Happiness   Discussion/Intervention:  Group content today was focused on Happiness. The group defined happiness and described where happiness comes from. Individuals identified what makes them happy and how they go about making others happy. Patients expressed things that stop them from being happy and ways they can improve their happiness. The group stated reasons why it is important to be happy. The group participated in the intervention "My Happiness", where they had a chance to identify and express things that make them happy. Clinical Observations/Feedback:  Patient came to group late due to unknown reasons.  Individual was social with peers and staff while participating in group.  Lyza Houseworth LRT/CTRS         Zaire Levesque 07/25/2019 11:52 AM

## 2019-07-25 NOTE — Progress Notes (Signed)
[facility]  07/25/2019 1:25 PM Osvaldo AngstMariah E Fruge  MRN:  [id number] Subjective: Follow-up for this young woman with major depression.  Patient seen chart reviewed.  Patient has been suffering from depressive symptoms probably for years but things have been worse the last few months.  She had been seeing a therapist but is been quite sometime since she did so.  Came into the hospital with suicidal ideation.  Today she continues to present with very depressed mood flat depressed affect withdrawn behavior.  She is agreeable to and cooperative with medication.  She denies acute suicidal intent or plan.  Does not have overt psychotic symptoms.  Patient had been started on antidepressant medication yesterday no complaints of any side effects.  Has not been attending groups or getting out of her room very much.  Seems to be anxious and socially isolated Principal Problem: MDD (major depressive disorder), recurrent severe, without psychosis (HCC) Diagnosis: Principal Problem:   MDD (major depressive disorder), recurrent severe, without psychosis ([facility])  Total Time spent with patient: 30 minutes  Past Psychiatric History: No prior hospitalizations.  Some prior treatment of depression.  Past Medical History:  Past Medical History:  Diagnosis Date  . Asthma   . Depression   . Lumbar herniated disc   . Tetanus     Past Surgical History:  Procedure Laterality Date  . LAPAROSCOPIC APPENDECTOMY N/A 07/15/2016   Procedure: APPENDECTOMY LAPAROSCOPIC;  Surgeon: Ricarda Frameharles Woodham, MD;  Location: [facility] ORS;  Service: General;  Laterality: N/A;   Family History:  Family History  Problem Relation Age of Onset  . Healthy Mother   . Healthy Father    Family Psychiatric  History: See notes Social History:  Social History   Substance and Sexual Activity  Alcohol Use No     Social History   Substance and Sexual Activity  Drug Use No    Social History   Socioeconomic History  . Marital status:  Single    Spouse name: Not on file  . Number of children: Not on file  . Years of education: Not on file  . Highest education level: Not on file  Occupational History  . Not on file  Social Needs  . Financial resource strain: Not on file  . Food insecurity    Worry: Not on file    Inability: Not on file  . Transportation needs    Medical: Not on file    Non-medical: Not on file  Tobacco Use  . Smoking status: Never Smoker  . Smokeless tobacco: Never Used  Substance and Sexual Activity  . Alcohol use: No  . Drug use: No  . Sexual activity: Yes    Partners: Male    Birth control/protection: None, Condom  Lifestyle  . Physical activity    Days per week: Not on file    Minutes per session: Not on file  . Stress: Not on file  Relationships  . Social Musicianconnections    Talks on phone: Not on file    Gets together: Not on file    Attends religious service: Not on file    Active member of club or organization: Not on file    Attends meetings of clubs or organizations: Not on file    Relationship status: Not on file  Other Topics Concern  . Not on file  Social History Narrative   Single.   [employer].   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   Enjoys reading, spending  time with friends.   Additional Social History:                         Sleep: Fair  Appetite:  Fair  Current Medications: Current Facility-Administered Medications  Medication Dose Route Frequency Provider Last Rate Last Dose  . acetaminophen (TYLENOL) tablet 650 mg  650 mg Oral Q6H PRN Caroline Sauger, NP      . alum & mag hydroxide-simeth (MAALOX/MYLANTA) 200-200-20 MG/5ML suspension 30 mL  30 mL Oral Q4H PRN Caroline Sauger, NP      . hydrOXYzine (ATARAX/VISTARIL) tablet 25 mg  25 mg Oral Q6H PRN Caroline Sauger, NP      . magnesium hydroxide (MILK OF MAGNESIA) suspension 30 mL  30 mL Oral Daily PRN Caroline Sauger, NP      . Derrill Memo ON 07/26/2019] venlafaxine XR  (EFFEXOR-XR) 24 hr capsule 75 mg  75 mg Oral Q breakfast Sire Poet T, MD        Lab Results: No results found for this or any previous visit (from the past 48 hour(s)).  Blood Alcohol level:  Lab Results  Component Value Date   ETH <10 27/74/1287    Metabolic Disorder Labs: No results found for: HGBA1C, MPG No results found for: PROLACTIN No results found for: CHOL, TRIG, HDL, CHOLHDL, VLDL, LDLCALC  Physical Findings: AIMS:  , ,  ,  ,    CIWA:    COWS:     Musculoskeletal: Strength & Muscle Tone: within normal limits Gait & Station: normal Patient leans: N/A  Psychiatric Specialty Exam: Physical Exam  Nursing note and vitals reviewed. Constitutional: She appears well-developed and well-nourished.  HENT:  Head: Normocephalic and atraumatic.  Eyes: Pupils are equal, round, and reactive to light. Conjunctivae are normal.  Neck: Normal range of motion.  Cardiovascular: Regular rhythm and normal heart sounds.  Respiratory: Effort normal. No respiratory distress.  GI: Soft.  Musculoskeletal: Normal range of motion.  Neurological: She is alert.  Skin: Skin is warm and dry.  Psychiatric: Judgment normal. Her affect is blunt. Her speech is delayed. She is slowed. Cognition and memory are normal. She exhibits a depressed mood. She expresses suicidal ideation. She expresses no suicidal plans.    Review of Systems  Constitutional: Negative.   HENT: Negative.   Eyes: Negative.   Respiratory: Negative.   Cardiovascular: Negative.   Gastrointestinal: Negative.   Musculoskeletal: Negative.   Skin: Negative.   Neurological: Negative.   Psychiatric/Behavioral: Positive for depression and suicidal ideas. Negative for hallucinations, memory loss and substance abuse. The patient is nervous/anxious and has insomnia.     Blood pressure 116/83, pulse 81, temperature 98 F (36.7 C), temperature source Oral, resp. rate 18, height 5' (1.524 m), weight 70.8 kg, SpO2 99 %.Body mass  index is 30.47 kg/m.  General Appearance: Casual  Eye Contact:  Minimal  Speech:  Slow  Volume:  Decreased  Mood:  Depressed  Affect:  Congruent  Thought Process:  Coherent  Orientation:  Full (Time, Place, and Person)  Thought Content:  Rumination  Suicidal Thoughts:  Yes.  without intent/plan  Homicidal Thoughts:  No  Memory:  Immediate;   Fair Recent;   Fair Remote;   Fair  Judgement:  Fair  Insight:  Shallow  Psychomotor Activity:  Decreased  Concentration:  Concentration: Fair  Recall:  [employer] of Knowledge:  Fair  Language:  Fair  Akathisia:  No  Handed:  Right  AIMS (if indicated):  Assets:  Desire for Improvement Housing Physical Health Social Support  ADL's:  Impaired  Cognition:  Impaired,  Mild  Sleep:  Number of Hours: 7.25     Treatment Plan Summary: Daily contact with patient to assess and evaluate symptoms and progress in treatment, Medication management and Plan Very withdrawn depressed appearing young woman.  Cooperative but passive.  Did some psychoeducation about treatment of depression.  Reviewed medication.  Increased Effexor dose to 75 mg/day.  Patient gave me permission to call her father on the phone.  He had called earlier wanting to speak to nursing.  We reviewed options for outpatient treatment.  Urged patient to attend groups and we will reevaluate tomorrow to see if she is ready for discharge.  Mordecai Rasmussen, MD 07/25/2019, 1:25 PM

## 2019-07-25 NOTE — Progress Notes (Signed)
D - Patient was on the phone upon arrival to the unit. Patient was pleasant during assessment denying SI/HI/AVH, pain, anxiety and depression. Patient was observed interacting appropriately with staff and peers on the unit. Patient was more interactive on the unit this evening and is hopeful she can go home tomorrow.   A - Patient didn't have any scheduled medications this evening. Patient given education. Patient given support and encouragement to be active in her treatment plan. Patient informed to let staff know if there are any issues or problems on the unit.   R - Patient being monitored Q 15 minutes for safety per unit protocol. Patient remains safe on the unit.

## 2019-07-25 NOTE — Tx Team (Addendum)
Interdisciplinary Treatment and Diagnostic Plan Update  07/25/2019 Time of Session: 900am [name] MRN: [id number]  Principal Diagnosis: MDD (major depressive disorder), recurrent severe, without psychosis ([name])  Secondary Diagnoses: Principal Problem:   MDD (major depressive disorder), recurrent severe, without psychosis ([name])   Current Medications:  Current Facility-Administered Medications  Medication Dose Route Frequency Provider Last Rate Last Dose  . acetaminophen (TYLENOL) tablet 650 mg  650 mg Oral Q6H PRN Caroline Sauger, NP      . alum & mag hydroxide-simeth (MAALOX/MYLANTA) 200-200-20 MG/5ML suspension 30 mL  30 mL Oral Q4H PRN Caroline Sauger, NP      . hydrOXYzine (ATARAX/VISTARIL) tablet 25 mg  25 mg Oral Q6H PRN Caroline Sauger, NP      . magnesium hydroxide (MILK OF MAGNESIA) suspension 30 mL  30 mL Oral Daily PRN Caroline Sauger, NP      . Derrill Memo ON 07/26/2019] venlafaxine XR (EFFEXOR-XR) 24 hr capsule 75 mg  75 mg Oral Q breakfast Clapacs, Madie Reno, MD       PTA Medications: Facility-Administered Medications Prior to Admission  Medication Dose Route Frequency Provider Last Rate Last Dose  . Levonorgestrel IUD 19.5 mg  1 each Intrauterine Continuous Donnamae Jude, MD   19.5 mg at 04/26/18 1030   No medications prior to admission.    Patient Stressors: Financial difficulties Marital or family conflict  Patient Strengths: Network engineer for treatment/growth Supportive family/friends  Treatment Modalities: Medication Management, Group therapy, Case management,  1 to 1 session with clinician, Psychoeducation, Recreational therapy.   Physician Treatment Plan for Primary Diagnosis: MDD (major depressive disorder), recurrent severe, without psychosis ([name]) Long Term Goal(s): Improvement in symptoms so as ready for discharge Improvement in symptoms so as ready for discharge   Short Term Goals:  Ability to identify changes in lifestyle to reduce recurrence of condition will improve Ability to verbalize feelings will improve Ability to disclose and discuss suicidal ideas Ability to demonstrate self-control will improve Ability to identify and develop effective coping behaviors will improve Ability to maintain clinical measurements within normal limits will improve Compliance with prescribed medications will improve Ability to identify changes in lifestyle to reduce recurrence of condition will improve Ability to verbalize feelings will improve Ability to disclose and discuss suicidal ideas Ability to demonstrate self-control will improve Ability to identify and develop effective coping behaviors will improve Ability to maintain clinical measurements within normal limits will improve Compliance with prescribed medications will improve  Medication Management: Evaluate patient's response, side effects, and tolerance of medication regimen.  Therapeutic Interventions: 1 to 1 sessions, Unit Group sessions and Medication administration.  Evaluation of Outcomes: Not Met  Physician Treatment Plan for Secondary Diagnosis: Principal Problem:   MDD (major depressive disorder), recurrent severe, without psychosis ([name])  Long Term Goal(s): Improvement in symptoms so as ready for discharge Improvement in symptoms so as ready for discharge   Short Term Goals: Ability to identify changes in lifestyle to reduce recurrence of condition will improve Ability to verbalize feelings will improve Ability to disclose and discuss suicidal ideas Ability to demonstrate self-control will improve Ability to identify and develop effective coping behaviors will improve Ability to maintain clinical measurements within normal limits will improve Compliance with prescribed medications will improve Ability to identify changes in lifestyle to reduce recurrence of condition will improve Ability to verbalize  feelings will improve Ability to disclose and discuss suicidal ideas Ability to demonstrate self-control will improve Ability to identify and  develop effective coping behaviors will improve Ability to maintain clinical measurements within normal limits will improve Compliance with prescribed medications will improve     Medication Management: Evaluate patient's response, side effects, and tolerance of medication regimen.  Therapeutic Interventions: 1 to 1 sessions, Unit Group sessions and Medication administration.  Evaluation of Outcomes: Not Met   RN Treatment Plan for Primary Diagnosis: MDD (major depressive disorder), recurrent severe, without psychosis ([name]) Long Term Goal(s): Knowledge of disease and therapeutic regimen to maintain health will improve  Short Term Goals: Ability to demonstrate self-control, Ability to participate in decision making will improve, Ability to verbalize feelings will improve, Ability to disclose and discuss suicidal ideas and Ability to identify and develop effective coping behaviors will improve  Medication Management: RN will administer medications as ordered by provider, will assess and evaluate patient's response and provide education to patient for prescribed medication. RN will report any adverse and/or side effects to prescribing provider.  Therapeutic Interventions: 1 on 1 counseling sessions, Psychoeducation, Medication administration, Evaluate responses to treatment, Monitor vital signs and CBGs as ordered, Perform/monitor CIWA, COWS, AIMS and Fall Risk screenings as ordered, Perform wound care treatments as ordered.  Evaluation of Outcomes: Not Met   LCSW Treatment Plan for Primary Diagnosis: MDD (major depressive disorder), recurrent severe, without psychosis ([name]) Long Term Goal(s): Safe transition to appropriate next level of care at discharge, Engage patient in therapeutic group addressing interpersonal concerns.  Short Term Goals: Engage  patient in aftercare planning with referrals and resources, Increase ability to appropriately verbalize feelings and Increase skills for wellness and recovery  Therapeutic Interventions: Assess for all discharge needs, 1 to 1 time with Social worker, Explore available resources and support systems, Assess for adequacy in community support network, Educate family and significant other(s) on suicide prevention, Complete Psychosocial Assessment, Interpersonal group therapy.  Evaluation of Outcomes: Not Met   Progress in Treatment: Attending groups: No. Participating in groups: No. Taking medication as prescribed: Yes. Toleration medication: Yes. Family/Significant other contact made: No, will contact:  pt declined collateral contact Patient understands diagnosis: Yes. Discussing patient identified problems/goals with staff: Yes. Medical problems stabilized or resolved: Yes. Denies suicidal/homicidal ideation: Yes. Issues/concerns per patient self-inventory: No. Other: N/A  New problem(s) identified: No, Describe:  none  New Short Term/Long Term Goal(s): Detox, elimination of AVH/symptoms of psychosis, medication management for mood stabilization; elimination of SI thoughts; development of comprehensive mental wellness/sobriety plan.   Patient Goals:  "Just get through the issues I never have before"  Discharge Plan or Barriers: SPE pamphlet, Mobile Crisis information, and AA/NA information provided to patient for additional community support and resources. Pt agreeable to referral to [facility] counseling center.  Reason for Continuation of Hospitalization: Anxiety Depression Medication stabilization  Estimated Length of Stay: 1-2 days  Recreational Therapy: Patient Stressors: N/A Patient Goal: Patient will engage in groups without prompting or encouragement from LRT x3 group sessions within 5 recreation therapy group sessions  Attendees: Patient: Jill Osborne 07/25/2019 10:46 AM   Physician: Dr Weber Cooks MD 07/25/2019 10:46 AM  Nursing:  07/25/2019 10:46 AM  RN Care Manager: 07/25/2019 10:46 AM  Social Worker: Minette Brine Moton LCSW 07/25/2019 10:46 AM  Recreational Therapist: Roanna Epley CTRS LRT 07/25/2019 10:46 AM  Other: Assunta Curtis LCSW 07/25/2019 10:46 AM  Other: Rockne Menghini 07/25/2019 10:46 AM  Other: 07/25/2019 10:46 AM    Scribe for Treatment Team: Mariann Laster Moton, LCSW 07/25/2019 10:46 AM

## 2019-07-25 NOTE — Plan of Care (Signed)
D: Pt during assessments denies SI/HI/AVH, able to verbally contract for safety. Pt is pleasant and compliant with taking her scheduled medication, but engagement is very minimal and does not forward almost any information. Pt. Does however endorse her mood as, "I'm just so sad". Pt. Denies physical pain, reports emotional pain. Pt. Affect is flat and constricted.   A: Q x 15 minute observation checks in place for safety. Patient was and is provided with education throughout shift.  Patient was and will be given/offered medications per orders. Patient was and is encouraged to attend groups, participate in unit activities and continue with plan of care. Pt. Chart and plans of care reviewed. Pt. Given support and encouragement.   R: Patient is complaint with medication and unit procedures for the most part, but participation with groups and activities is lacking. Pt. Observed eating fair. Pt. Mostly isolative and withdrawn to room, not wanting to engage much with others or staff.    Problem: Education: Goal: Knowledge of [facility] Education information/materials will improve Outcome: Not Progressing Goal: Emotional status will improve Outcome: Not Progressing Goal: Mental status will improve Outcome: Not Progressing   Problem: Health Behavior/Discharge Planning: Goal: Compliance with treatment plan for underlying cause of condition will improve Outcome: Progressing   Problem: Safety: Goal: Periods of time without injury will increase Outcome: Progressing   Problem: Coping: Goal: Coping ability will improve Outcome: Not Progressing Goal: Will verbalize feelings Outcome: Not Progressing   Problem: Health Behavior/Discharge Planning: Goal: Compliance with therapeutic regimen will improve Outcome: Progressing

## 2019-07-25 NOTE — [facility] Group Notes (Signed)
LCSW Group Therapy Note  07/25/2019 12:54 PM  Type of Therapy/Topic:  Group Therapy:  Balance in Life  Participation Level:  Minimal  Description of Group:    This group will address the concept of balance and how it feels and looks when one is unbalanced. Patients will be encouraged to process areas in their lives that are out of balance and identify reasons for remaining unbalanced. Facilitators will guide patients in utilizing problem-solving interventions to address and correct the stressor making their life unbalanced. Understanding and applying boundaries will be explored and addressed for obtaining and maintaining a balanced life. Patients will be encouraged to explore ways to assertively make their unbalanced needs known to significant others in their lives, using other group members and facilitator for support and feedback.  Therapeutic Goals: 1. Patient will identify two or more emotions or situations they have that consume much of in their lives. 2. Patient will identify signs/triggers that life has become out of balance:  3. Patient will identify two ways to set boundaries in order to achieve balance in their lives:  4. Patient will demonstrate ability to communicate their needs through discussion and/or role plays  Summary of Patient Progress: Pt was present in group and engaged in the group discussion, but was pretty soft spoken and did not engage too much. Pt reported that her career is at about 50% because she is currently still working towards her career as she is in college. Pt reported that she enjoys reading as a coping skill.      Therapeutic Modalities:   Cognitive Behavioral Therapy Solution-Focused Therapy Assertiveness Training  Evalina Field, MSW, LCSW Clinical Social Work 07/25/2019 12:54 PM

## 2019-07-25 NOTE — Plan of Care (Signed)
Patient denies SI/HI/AVH, depression and anxiety. Patient says she feels ready to go home tomorrow.   Problem: Education: Goal: Emotional status will improve Outcome: Progressing Goal: Mental status will improve Outcome: Progressing

## 2019-07-26 MED ORDER — VENLAFAXINE HCL ER 75 MG PO CP24: 75.0000 mg | ORAL_CAPSULE | Freq: Every day | ORAL | 1 refills | Status: DC

## 2019-07-26 NOTE — Plan of Care (Signed)
D: Pt during assessments denies SI/HI/AVH, able to contract for safety. Pt is pleasant and more cooperative during assessments, engagement improved. Pt. Endorses an improving mood.   A: Q x 15 minute observation checks in place for safety. Patient was and is provided with education throughout shift and given extensive discharge education. Patient was and will be given/offered medications per orders. Patient was and is encouraged to attend groups, participate in unit activities and continue with plan of care. Pt. Chart and plans of care reviewed. Pt. Given support and encouragement.   R: Patient is complaint with medication and unit procedures. Pt. Observed eating good today. Pt. After speaking with provider has been cleared for discharge. Pt. Will be prepared for discharge.     Problem: Education: Goal: Knowledge of [facility] Education information/materials will improve Outcome: Completed/Met Goal: Emotional status will improve Outcome: Completed/Met Goal: Mental status will improve Outcome: Completed/Met Goal: Verbalization of understanding the information provided will improve Outcome: Completed/Met   Problem: Health Behavior/Discharge Planning: Goal: Compliance with treatment plan for underlying cause of condition will improve Outcome: Completed/Met   Problem: Safety: Goal: Periods of time without injury will increase Outcome: Completed/Met   Problem: Education: Goal: Utilization of techniques to improve thought processes will improve Outcome: Completed/Met   Problem: Coping: Goal: Coping ability will improve Outcome: Completed/Met Goal: Will verbalize feelings Outcome: Completed/Met   Problem: Health Behavior/Discharge Planning: Goal: Compliance with therapeutic regimen will improve Outcome: Completed/Met

## 2019-07-26 NOTE — Progress Notes (Signed)
[facility]  07/26/2019 11:14 AM Jill AngstMariah E Osborne  MRN:  [id number]   Subjective:  Patient's chart and findings reviewed and discussed with treatment team.  Follow-up for this patient who is a 18 year old female who presented to the hospital from suffering from depression that has been worsening over the last few months.  Patient reports today that she feels the best today that she has.  She states that she had extremely good sleep last night without having any medication assistance and that her appetite has been improved.  She states that she is not very fond of the food here but still eats.  She also reports today that yesterday she was having some crying spells due to her depression and started thinking about why she was feeling depressed over her boyfriend.  She states that she feels that she may have some issues with abandonment as her father had left her, her stepfather had left her, and now her boyfriend had left her.  She states that she continues to feel better and is hoping to discharge soon.  She reports her depression at a 4 out of 10 with 10 being the worst and denies having any anxiety today.  She reports that she is been in contact with her mother and her mother has scheduled an appointment for her with her therapist Lawson FiscalLori on Monday but she is unaware of the time.  She denies any medication side effects at this time and feels that the medications are assisting her with her improvement.  Patient has continued to deny any suicidal homicidal ideations and denies any hallucinations.  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 No hospitalizations, no psychiatric medications. MDD  Past Medical History:  Past Medical History:   Diagnosis Date  . Asthma   . Depression   . Lumbar herniated disc   . Tetanus     Past Surgical History:  Procedure Laterality Date  . LAPAROSCOPIC APPENDECTOMY N/A 07/15/2016   Procedure: APPENDECTOMY LAPAROSCOPIC;  Surgeon: Ricarda Frameharles Woodham, MD;  Location: [facility] ORS;  Service: General;  Laterality: N/A;   Family History:  Family History  Problem Relation Age of Onset  . Healthy Mother   . Healthy Father    Family Psychiatric  History: None reported Social History:  Social History   Substance and Sexual Activity  Alcohol Use No     Social History   Substance and Sexual Activity  Drug Use No    Social History   Socioeconomic History  . Marital status: Single    Spouse name: Not on file  . Number of children: Not on file  . Years of education: Not on file  . Highest education level: Not on file  Occupational History  . Not on file  Social Needs  . Financial resource strain: Not on file  . Food insecurity    Worry: Not on file    Inability: Not on file  . Transportation needs    Medical: Not on file    Non-medical: Not on file  Tobacco Use  . Smoking status: Never Smoker  . Smokeless tobacco: Never Used  Substance and Sexual Activity  . Alcohol use: No  . Drug use: No  . Sexual activity: Yes    Partners: Male    Birth control/protection: None,  [place]  Lifestyle  . Physical activity    Days per week: Not on file    Minutes per session: Not on file  . Stress: Not on file  Relationships  . Social Musician on phone: Not on file    Gets together: Not on file    Attends religious service: Not on file    Active member of club or organization: Not on file    Attends meetings of clubs or organizations: Not on file    Relationship status: Not on file  Other Topics Concern  . Not on file  Social History Narrative   Single.   Student at [employer], [employer].   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   Enjoys reading, spending time with friends.    Additional Social History:                         Sleep: Good  Appetite:  Good  Current Medications: Current Facility-Administered Medications  Medication Dose Route Frequency Provider Last Rate Last Dose  . acetaminophen (TYLENOL) tablet 650 mg  650 mg Oral Q6H PRN Gillermo Murdoch, NP      . alum & mag hydroxide-simeth (MAALOX/MYLANTA) 200-200-20 MG/5ML suspension 30 mL  30 mL Oral Q4H PRN Gillermo Murdoch, NP      . hydrOXYzine (ATARAX/VISTARIL) tablet 25 mg  25 mg Oral Q6H PRN Gillermo Murdoch, NP      . magnesium hydroxide (MILK OF MAGNESIA) suspension 30 mL  30 mL Oral Daily PRN Gillermo Murdoch, NP      . venlafaxine XR (EFFEXOR-XR) 24 hr capsule 75 mg  75 mg Oral Q breakfast Clapacs, Jackquline Denmark, MD   75 mg at 07/26/19 5188    Lab Results: No results found for this or any previous visit (from the past 48 hour(s)).  Blood Alcohol level:  Lab Results  Component Value Date   ETH <10 07/22/2019    Metabolic Disorder Labs: No results found for: HGBA1C, MPG No results found for: PROLACTIN No results found for: CHOL, TRIG, HDL, CHOLHDL, VLDL, LDLCALC  Physical Findings: AIMS:  , ,  ,  ,    CIWA:    COWS:     Musculoskeletal: Strength & Muscle Tone: within normal limits Gait & Station: normal Patient leans: N/A  Psychiatric Specialty Exam: Physical Exam  Nursing note and vitals reviewed. Constitutional: She is oriented to person, place, and time. She appears well-developed and well-nourished.  Cardiovascular: Normal rate.  Respiratory: Effort normal.  Musculoskeletal: Normal range of motion.  Neurological: She is alert and oriented to person, place, and time.  Skin: Skin is warm.    Review of Systems  Constitutional: Negative.   HENT: Negative.   Eyes: Negative.   Respiratory: Negative.   Cardiovascular: Negative.   Gastrointestinal: Negative.   Genitourinary: Negative.   Musculoskeletal: Negative.   Skin: Negative.   Neurological:  Negative.   Endo/Heme/Allergies: Negative.   Psychiatric/Behavioral: Positive for depression.    Blood pressure 119/86, pulse 76, temperature 98.1 F (36.7 C), temperature source Oral, resp. rate 18, height 5' (1.524 m), weight 70.8 kg, SpO2 99 %.Body mass index is 30.47 kg/m.  General Appearance: Casual  Eye Contact:  Good  Speech:  Clear and Coherent and Normal Rate  Volume:  Normal  Mood:  Euthymic  Affect:  Congruent  Thought Process:  Coherent and Descriptions of Associations: Intact  Orientation:  Full (Time, Place, and Person)  Thought Content:  WDL  Suicidal Thoughts:  No  Homicidal Thoughts:  No  Memory:  Immediate;   Good Recent;   Good Remote;   Good  Judgement:  Fair  Insight:  Fair  Psychomotor Activity:  Normal  Concentration:  Concentration: Good  Recall:  Good  Fund of Knowledge:  Good  Language:  Good  Akathisia:  No  Handed:  Right  AIMS (if indicated):     Assets:  Communication Skills Desire for Improvement Financial Resources/Insurance Housing Physical Health Social Support Transportation  ADL's:  Intact  Cognition:  WNL  Sleep:  Number of Hours: 7.25   Assessment: Patient presents attending group and is pleasant, calm, and cooperative.  Patient is smiling and showing more congruent affect.  Patient has shown some significant improvement from her first day being admitted to the hospital.  Glad to see that her appetite and her sleep have both improved as well as her depression.  Based off the report to the patient's parents are very supportive and follow-up care.  Feel that if patient continues she may be discharged over the weekend if safety plan has been arranged with parents.  Patient is encouraged to focus on future plans as she has not really thought about those.  Encouraged patient to think about going through her things that her parents are picked up as there may trigger some depressive thoughts of breaking up with her boyfriend.  Treatment Plan  Summary: Daily contact with patient to assess and evaluate symptoms and progress in treatment and Medication management  As patient has shown some improvement with increased dose of Effexor XR 75 mg will continue medication at current dose.  We will continue Vistaril 25 mg p.o. 3 times daily as needed if there is any anxiety reported.  Have encouraged patient to continue attending groups and discussing plan of care with her parents.  [place], FNP 07/26/2019, 11:14 AM

## 2019-07-26 NOTE — Progress Notes (Signed)
Recreation Therapy Notes    Date:07/26/2019  Time: 9:30 am  Location: Craft room  Behavioral response: Appropriate   Intervention Topic: Emotions  Discussion/Intervention:  Group content on today was focused on emotions. The group identified what emotions are and why it is important to have emotions. Patients expressed some positive and negative emotions. Individuals gave some past experiences on how they normally dealt with emotions in the past. The group described some positive ways to deal with emotions in the future. Patients participated in the intervention "Name the Jill Osborne" where individuals were given a chance to experience different emotions.  Clinical Observations/Feedback:  Patient came to group and explained a common emotions she feels is anxious. She expressed that she thinks most emotions come from circumstances. Participant stated that she normally handles her emotions by yelling and crying. Individual was social with peers and staff while participating in group.  Jill Osborne LRT/CTRS         Latash Nouri 07/26/2019 11:19 AM

## 2019-07-26 NOTE — [facility] Group Notes (Signed)
LCSW Group Therapy Note  07/26/2019 1:00 PM  Type of Therapy and Topic:  Group Therapy:  Feelings around Relapse and Recovery  Participation Level:  Active   Description of Group:    Patients in this group will discuss emotions they experience before and after a relapse. They will process how experiencing these feelings, or avoidance of experiencing them, relates to having a relapse. Facilitator will guide patients to explore emotions they have related to recovery. Patients will be encouraged to process which emotions are more powerful. They will be guided to discuss the emotional reaction significant others in their lives may have to their relapse or recovery. Patients will be assisted in exploring ways to respond to the emotions of others without this contributing to a relapse.  Therapeutic Goals: 1. Patient will identify two or more emotions that lead to a relapse for them 2. Patient will identify two emotions that result when they relapse 3. Patient will identify two emotions related to recovery 4. Patient will demonstrate ability to communicate their needs through discussion and/or role plays   Summary of Patient Progress: Patient was an active participant in group.  Patient shared that being in the presence of her triggers have been negative and led to relapse of mental health. 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    Therapeutic Modalities:   Cognitive Behavioral Therapy Solution-Focused Therapy Assertiveness Training Relapse Prevention Therapy   Assunta Curtis, MSW, LCSW 07/26/2019 12:20 PM

## 2019-07-26 NOTE — Progress Notes (Signed)
D:Patient denies SI/HI/AVH, able to contract for safety at this time. Pt appears calm and cooperative, and no distress noted.   A: All Personal items in locker returned to pt. Upon discharge.   R:  Pt States she will comply with discharge planning put into place and take MEDS as prescribed. Pt escorted out of the building by this Probation officer

## 2019-07-26 NOTE — Discharge Summary (Signed)
Physician Discharge Summary Note  Patient:  Jill Osborne is an 18 y.o., female MRN:  [id number] DOB:  11/17/2000 Patient phone:  [phone] (home)  Patient address:   [address] [place] [address],  Total Time spent with patient: 30 minutes  Date of Admission:  07/23/2019 Date of Discharge: July 26, 2019  Reason for Admission: Patient was admitted because of concern about having voiced suicidal ideation with significant depressive symptoms  Principal Problem: MDD (major depressive disorder), recurrent severe, without psychosis (HCC) Discharge Diagnoses: Principal Problem:   MDD (major depressive disorder), recurrent severe, without psychosis (HCC)   Past Psychiatric History: Past history of depression no previous hospitalizations no previous medication.  Has seen a therapist before.  Major stresses recently seem to have worsened her symptoms  Past Medical History:  Past Medical History:  Diagnosis Date  . Asthma   . Depression   . Lumbar herniated disc   . Tetanus     Past Surgical History:  Procedure Laterality Date  . LAPAROSCOPIC APPENDECTOMY N/A 07/15/2016   Procedure: APPENDECTOMY LAPAROSCOPIC;  Surgeon: Ricarda Frame, MD;  Location: [facility] ORS;  Service: General;  Laterality: N/A;   Family History:  Family History  Problem Relation Age of Onset  . Healthy Mother   . Healthy Father    Family Psychiatric  History: See previous. Social History:  Social History   Substance and Sexual Activity  Alcohol Use No     Social History   Substance and Sexual Activity  Drug Use No    Social History   Socioeconomic History  . Marital status: Single    Spouse name: Not on file  . Number of children: Not on file  . Years of education: Not on file  . Highest education level: Not on file  Occupational History  . Not on file  Social Needs  . Financial resource strain: Not on file  . Food insecurity    Worry: Not on file    Inability: Not on file  .  Transportation needs    Medical: Not on file    Non-medical: Not on file  Tobacco Use  . Smoking status: Never Smoker  . Smokeless tobacco: Never Used  Substance and Sexual Activity  . Alcohol use: No  . Drug use: No  . Sexual activity: Yes    Partners: Male    Birth control/protection: None, Condom  Lifestyle  . Physical activity    Days per week: Not on file    Minutes per session: Not on file  . Stress: Not on file  Relationships  . Social Musician on phone: Not on file    Gets together: Not on file    Attends religious service: Not on file    Active member of club or organization: Not on file    Attends meetings of clubs or organizations: Not on file    Relationship status: Not on file  Other Topics Concern  . Not on file  Social History Narrative   Single.   Student at [employer], [employer].   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   Enjoys reading, spending time with friends.    Hospital Course: Patient admitted to the psychiatric ward.  15-minute checks maintained.  Patient did not display any dangerous suicidal or aggressive behavior on the unit.  She was cooperative with treatment.  Attended groups appropriately.  Was open to starting antidepressant medicine.  Patient was started on Effexor and the dose was increased  to 75 mg.  Tolerated medicine without any side effects.  We were in contact with her family on at least 2 occasions.  They report feeling like she is doing much better and their contact with her.  Patient is denying any suicidal thoughts and is agreeable to a plan of going back home to stay with her parents and follow-up with outpatient psychiatric treatment and therapy.  Physical Findings: AIMS:  , ,  ,  ,    CIWA:    COWS:     Musculoskeletal: Strength & Muscle Tone: within normal limits Gait & Station: normal Patient leans: N/A  Psychiatric Specialty Exam: Physical Exam  Nursing note and vitals reviewed. Constitutional: She appears  well-developed and well-nourished.  HENT:  Head: Normocephalic and atraumatic.  Eyes: Pupils are equal, round, and reactive to light. Conjunctivae are normal.  Neck: Normal range of motion.  Cardiovascular: Regular rhythm and normal heart sounds.  Respiratory: Effort normal. No respiratory distress.  GI: Soft.  Musculoskeletal: Normal range of motion.  Neurological: She is alert.  Skin: Skin is warm and dry.  Psychiatric: She has a normal mood and affect. Her speech is normal and behavior is normal. Judgment and thought content normal. Cognition and memory are normal.    Review of Systems  Constitutional: Negative.   HENT: Negative.   Eyes: Negative.   Respiratory: Negative.   Cardiovascular: Negative.   Gastrointestinal: Negative.   Musculoskeletal: Negative.   Skin: Negative.   Neurological: Negative.   Psychiatric/Behavioral: Negative.     Blood pressure 119/86, pulse 76, temperature 98.1 F (36.7 C), temperature source Oral, resp. rate 18, height 5' (1.524 m), weight 70.8 kg, SpO2 99 %.Body mass index is 30.47 kg/m.  General Appearance: Casual  Eye Contact:  Good  Speech:  Clear and Coherent  Volume:  Normal  Mood:  Euthymic  Affect:  Constricted  Thought Process:  Goal Directed  Orientation:  Full (Time, Place, and Person)  Thought Content:  Logical  Suicidal Thoughts:  No  Homicidal Thoughts:  No  Memory:  Immediate;   Fair Recent;   Fair Remote;   Fair  Judgement:  Fair  Insight:  Fair  Psychomotor Activity:  Decreased  Concentration:  Concentration: Fair  Recall:  [employer] of Knowledge:  Fair  Language:  Fair  Akathisia:  No  Handed:  Right  AIMS (if indicated):     Assets:  Desire for Improvement Housing Physical Health Resilience Social Support  ADL's:  Intact  Cognition:  WNL  Sleep:  Number of Hours: 7.25     Have you used any form of tobacco in the last 30 days? (Cigarettes, Smokeless Tobacco, Cigars, and/or Pipes): No  Has this patient  used any form of tobacco in the last 30 days? (Cigarettes, Smokeless Tobacco, Cigars, and/or Pipes) Yes, No  Blood Alcohol level:  Lab Results  Component Value Date   ETH <10 07/37/1062    Metabolic Disorder Labs:  No results found for: HGBA1C, MPG No results found for: PROLACTIN No results found for: CHOL, TRIG, HDL, CHOLHDL, VLDL, LDLCALC  See Psychiatric Specialty Exam and Suicide Risk Assessment completed by Attending Physician prior to discharge.  Discharge destination:  Home  Is patient on multiple antipsychotic therapies at discharge:  No   Has Patient had three or more failed trials of antipsychotic monotherapy by history:  No  Recommended Plan for Multiple Antipsychotic Therapies: NA  Discharge Instructions    Diet - low sodium heart healthy  Complete by: As directed    Increase activity slowly   Complete by: As directed      Allergies as of 07/26/2019      Reactions   Shellfish Allergy Hives   Eggs Or Egg-derived Products Nausea And Vomiting      Medication List    TAKE these medications     Indication  venlafaxine XR 75 MG 24 hr capsule Commonly known as: EFFEXOR-XR Take 1 capsule (75 mg total) by mouth daily with breakfast. Start taking on: July 27, 2019  Indication: Major Depressive Disorder      Follow-up Information    Beverlyn RouxLori Woodard,LPC Follow up on 07/31/2019.   Why: Please follow up with Charlotte CrumbLori Woddard on Wednesday, October 14th at 9:00am. Contact information: [address]  [place], [place] [address] Phone: [phone]  [email or web address]           Follow-up recommendations:  Activity:  Activity as tolerated Diet:  Regular diet Other:  Continue on current medication.  Patient has outpatient follow-up arranged with her therapist.  She should begin medication follow-up if needed with her primary care doctor but then strongly consider follow-up by finding a psychiatrist in the area.  Patient is agreeable to the plan as are  her parents.  Comments: See above.  Prescription given at discharge  Signed: Mordecai RasmussenJohn , MD 07/26/2019, 5:14 PM

## 2019-07-26 NOTE — [facility] Suicide Risk Assessment (Signed)
[facility] Discharge Suicide Risk Assessment   Principal Problem: MDD (major depressive disorder), recurrent severe, without psychosis ([place]) Discharge Diagnoses: Principal Problem:   MDD (major depressive disorder), recurrent severe, without psychosis ([place])   Total Time spent with patient: 30 minutes  Musculoskeletal: Strength & Muscle Tone: within normal limits Gait & Station: normal Patient leans: N/A  Psychiatric Specialty Exam: Review of Systems  Constitutional: Negative.   HENT: Negative.   Eyes: Negative.   Respiratory: Negative.   Cardiovascular: Negative.   Gastrointestinal: Negative.   Musculoskeletal: Negative.   Skin: Negative.   Neurological: Negative.   Psychiatric/Behavioral: Negative.     Blood pressure 119/86, pulse 76, temperature 98.1 F (36.7 C), temperature source Oral, resp. rate 18, height 5' (1.524 m), weight 70.8 kg, SpO2 99 %.Body mass index is 30.47 kg/m.  General Appearance: Casual  Eye Contact::  Good  Speech:  Clear and [id number]  Volume:  Normal  Mood:  Euthymic  Affect:  Congruent  Thought Process:  Goal Directed  Orientation:  Full (Time, Place, and Person)  Thought Content:  Logical  Suicidal Thoughts:  No  Homicidal Thoughts:  No  Memory:  Immediate;   Fair Recent;   Fair Remote;   Fair  Judgement:  Fair  Insight:  Fair  Psychomotor Activity:  Decreased  Concentration:  Fair  Recall:  [employer] of Knowledge:Fair  Language: Fair  Akathisia:  No  Handed:  Right  AIMS (if indicated):     Assets:  Desire for Improvement Housing Physical Health Resilience Social Support  Sleep:  Number of Hours: 7.25  Cognition: WNL  ADL's:  Intact   Mental Status Per Nursing Assessment::   On Admission:  NA  Demographic Factors:  Adolescent or young adult  Loss Factors: NA  Historical Factors: NA  Risk Reduction Factors:   Sense of responsibility to family, Religious beliefs about death, Living with another person, especially a  relative and Positive social support  Continued Clinical Symptoms:  Depression:   Anhedonia  Cognitive Features That Contribute To Risk:  None    Suicide Risk:  Minimal: No identifiable suicidal ideation.  Patients presenting with no risk factors but with morbid ruminations; may be classified as minimal risk based on the severity of the depressive symptoms  Follow-up Information    Rodena Goldmann Follow up on 07/31/2019.   Why: Please follow up with Penni Bombard on Wednesday, October 14th at [place] information: [place], [place] [address] Phone: [phone]  [email or web address]           Plan Of Care/Follow-up recommendations:  Activity:  Activity as tolerated Diet:  Regular diet Other:  Patient will be discharged today with medication supplies and a plan to follow-up with her therapist on Monday and instructions to make an appointment to follow-up for medication management as well  Alethia Berthold, MD 07/26/2019, 4:21 PM

## 2019-09-02 ENCOUNTER — Ambulatory Visit (INDEPENDENT_AMBULATORY_CARE_PROVIDER_SITE_OTHER): Payer: BC Managed Care – PPO | Admitting: Adult Health

## 2019-09-02 ENCOUNTER — Ambulatory Visit (INDEPENDENT_AMBULATORY_CARE_PROVIDER_SITE_OTHER): Payer: BC Managed Care – PPO | Admitting: Family Medicine

## 2019-09-02 ENCOUNTER — Other Ambulatory Visit: Payer: Self-pay

## 2019-09-02 ENCOUNTER — Encounter: Payer: Self-pay | Admitting: Family Medicine

## 2019-09-02 ENCOUNTER — Encounter: Payer: Self-pay | Admitting: Adult Health

## 2019-09-02 VITALS — BP 104/86 | HR 72 | Wt 152.6 lb

## 2019-09-02 DIAGNOSIS — F4310 Post-traumatic stress disorder, unspecified: Secondary | ICD-10-CM | POA: Diagnosis not present

## 2019-09-02 DIAGNOSIS — Z113 Encounter for screening for infections with a predominantly sexual mode of transmission: Secondary | ICD-10-CM | POA: Diagnosis not present

## 2019-09-02 DIAGNOSIS — F411 Generalized anxiety disorder: Secondary | ICD-10-CM | POA: Diagnosis not present

## 2019-09-02 DIAGNOSIS — Z30431 Encounter for routine checking of intrauterine contraceptive device: Secondary | ICD-10-CM

## 2019-09-02 DIAGNOSIS — F331 Major depressive disorder, recurrent, moderate: Secondary | ICD-10-CM

## 2019-09-02 DIAGNOSIS — N898 Other specified noninflammatory disorders of vagina: Secondary | ICD-10-CM | POA: Diagnosis not present

## 2019-09-02 DIAGNOSIS — Z23 Encounter for immunization: Secondary | ICD-10-CM | POA: Diagnosis not present

## 2019-09-02 DIAGNOSIS — G4700 Insomnia, unspecified: Secondary | ICD-10-CM

## 2019-09-02 MED ORDER — VENLAFAXINE HCL ER 75 MG PO CP24: 75.0000 mg | ORAL_CAPSULE | Freq: Every day | ORAL | 1 refills | Status: DC

## 2019-09-02 NOTE — Progress Notes (Signed)
[facility] MD/PA/NP Initial Note  09/02/2019 12:23 PM Jill Osborne  MRN:  [id number]  Chief Complaint:  Chief Complaint    Depression; Anxiety; Insomnia; Trauma      HPI:   Describes mood today as "ok". Pleasant. Flat. Mood symptoms - reports decreased depression, anxiety, and irritability. Feels a little "sad" when she is alone. Also stating "It's nothing like it was when I went to the hospital". Stating "I had a lot of stress on me then". She and boyfriend of 2 years broke up and she moved back in with her parents. Mother found her "scratching" her wrists and took her to the hospital. Feels like she is "progressing". Has more anxiety - "like when I was young". Feels like medication has been "helpful". Was crying "all the time" and unable to sleep - "now I'm sleeping better". Improved interest and motivation. Taking medications as prescribed.  Improved interest and motivation. Taking medications as prescribed.  Energy levels low. Active, does not have a regular exercise routine. Unemployed currently.  Enjoys some usual interests and activities. Lives with parents. Visiting with extended family. Best friend in town visiting from [place]. Mostly staying at home. Appetite decreased. Eating once a day. Weight loss 5 pounds. Sleeping difficulties. Averages 3 to 4 hours. Having nightmares - "visibly remembering things that happened when I was young - maybe 4 or 5". Also stating "I can feel what was happening to me".  Focus and concentration difficulties prior to admission, but has gotten worse. Stating "sometimes I focus too much". Has taken this semester off - plans to return in January 2020. Completing tasks. Managing aspects of household - "helping out around the house.  Denies SI or HI. Denies AH or VH. Seeing therapist weekly.  Visit Diagnosis:    ICD-10-CM   1. Major depressive disorder, recurrent episode, moderate (HCC)  F33.1 venlafaxine XR (EFFEXOR-XR) 75 MG 24 hr capsule   DISCONTINUED: venlafaxine XR (EFFEXOR-XR) 75 MG 24 hr capsule  2. Insomnia, unspecified type  G47.00 venlafaxine XR (EFFEXOR-XR) 75 MG 24 hr capsule    DISCONTINUED: venlafaxine XR (EFFEXOR-XR) 75 MG 24 hr capsule  3. PTSD (post-traumatic stress disorder)  F43.10 venlafaxine XR (EFFEXOR-XR) 75 MG 24 hr capsule    DISCONTINUED: venlafaxine XR (EFFEXOR-XR) 75 MG 24 hr capsule  4. Generalized anxiety disorder  F41.1 venlafaxine XR (EFFEXOR-XR) 75 MG 24 hr capsule    DISCONTINUED: venlafaxine XR (EFFEXOR-XR) 75 MG 24 hr capsule    Past Psychiatric History: Hospitalized October 2020 at [facility] for 5 days - attempted suicide by cutting wrists. Was inpatient for 5 days.   Past Medical History:  Past Medical History:  Diagnosis Date  . Asthma   . Depression   . Lumbar herniated disc   . Tetanus     Past Surgical History:  Procedure Laterality Date  . LAPAROSCOPIC APPENDECTOMY N/A 07/15/2016   Procedure: APPENDECTOMY LAPAROSCOPIC;  Surgeon: Ricarda Frameharles Woodham, MD;  Location: [facility] ORS;  Service: General;  Laterality: N/A;    Family Psychiatric History: Denies  Family History:  Family History  Problem Relation Age of Onset  . Healthy Mother   . Healthy Father     Social History:  Social History   Socioeconomic History  . Marital status: Single    Spouse name: Not on file  . Number of children: Not on file  . Years of education: Not on file  . Highest education level: Not on file  Occupational History  . Not on file  Social Needs  .  Financial resource strain: Not on file  . Food insecurity    Worry: Not on file    Inability: Not on file  . Transportation needs    Medical: Not on file    Non-medical: Not on file  Tobacco Use  . Smoking status: Never Smoker  . Smokeless tobacco: Never Used  Substance and Sexual Activity  . Alcohol use: No  . Drug use: No  . Sexual activity: Yes    Partners: Male    Birth control/protection: None, Condom  Lifestyle  . Physical  activity    Days per week: Not on file    Minutes per session: Not on file  . Stress: Not on file  Relationships  . Social Musician on phone: Not on file    Gets together: Not on file    Attends religious service: Not on file    Active member of club or organization: Not on file    Attends meetings of clubs or organizations: Not on file    Relationship status: Not on file  Other Topics Concern  . Not on file  Social History Narrative   Single.   Student at [employer], [employer].   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   Enjoys reading, spending time with friends.    Allergies:  Allergies  Allergen Reactions  . Shellfish Allergy Hives  . Eggs Or Egg-Derived Products Nausea And Vomiting    Metabolic Disorder Labs: No results found for: HGBA1C, MPG No results found for: PROLACTIN No results found for: CHOL, TRIG, HDL, CHOLHDL, VLDL, LDLCALC Lab Results  Component Value Date   TSH 1.400 07/22/2019    Therapeutic Level Labs: No results found for: LITHIUM No results found for: VALPROATE No components found for:  CBMZ  Current Medications: Current Outpatient Medications  Medication Sig Dispense Refill  . venlafaxine XR (EFFEXOR-XR) 75 MG 24 hr capsule Take 1 capsule (75 mg total) by mouth daily with breakfast. 30 capsule 1   Current Facility-Administered Medications  Medication Dose Route Frequency Provider Last Rate Last Dose  . Levonorgestrel IUD 19.5 mg  1 each Intrauterine Continuous Reva Bores, MD   19.5 mg at 04/26/18 1030    Medication Side Effects: none  Orders placed this visit:  No orders of the defined types were placed in this encounter.   Psychiatric Specialty Exam:  Review of Systems  Neurological: Negative for tremors and weakness.    There were no vitals taken for this visit.There is no height or weight on file to calculate BMI.  General Appearance: Neat and Well Groomed  Eye Contact:  Fair  Speech:  Clear and Coherent  Volume:  Decreased   Mood:  Anxious, Depressed and Irritable  Affect:  Congruent  Thought Process:  Coherent  Orientation:  Full (Time, Place, and Person)  Thought Content: Logical   Suicidal Thoughts:  No  Homicidal Thoughts:  No  Memory:  WNL  Judgement:  Fair  Insight:  Fair  Psychomotor Activity:  Normal  Concentration:  Concentration: Fair  Recall:  NA  Fund of Knowledge: Good  Language: Good  Assets:  Communication Skills Desire for Improvement Financial Resources/Insurance Housing Intimacy Leisure Time Physical Health Resilience Social Support Talents/Skills Transportation Vocational/Educational  ADL's:  Intact  Cognition: WNL  Prognosis:  Good   Screenings: None  Receiving Psychotherapy: Yes   Treatment Plan/Recommendations:   Plan:  1. Continue Effexor XR 75mg  daily - does not want to change medications today. 2. Add Melatonin  at hs prn sleep  Discussed trauma therapy  Will focus on sleep over next two weeks and have patient return to office.   Request records from recent hospital stay at [facility].   RTC 4 weeks  Patient advised to contact office with any questions, adverse effects, or acute worsening in signs and symptoms.     Aloha Gell, NP

## 2019-09-02 NOTE — Patient Instructions (Signed)

## 2019-09-02 NOTE — Progress Notes (Signed)
   Subjective:    Patient ID: Jill Osborne is a 18 y.o. female presenting with DISCUSS IUD  on 09/02/2019  HPI: Here today at the behest of her mother due to increasing anxiety and depression. Her mother thinks it is all related to her IUD. Took a semester off college. Now on Effexor and notes improvement. Has Kyleena and really likes it. She does have some hyperpigmentation of her neck. She does not want her IUD removed. She is not having sex right now. Does report some vaginal discharge with odor.  Review of Systems  Constitutional: Negative for chills and fever.  Respiratory: Negative for shortness of breath.   Cardiovascular: Negative for chest pain.  Gastrointestinal: Negative for abdominal pain, nausea and vomiting.  Genitourinary: Negative for dysuria.  Skin: Negative for rash.      Objective:    BP 104/86   Pulse 72   Wt 152 lb 9.6 oz (69.2 kg)   LMP 08/18/2019   BMI 29.80 kg/m  Physical Exam Constitutional:      General: She is not in acute distress.    Appearance: She is well-developed.  HENT:     Head: Normocephalic and atraumatic.  Eyes:     General: No scleral icterus. Neck:     Musculoskeletal: Neck supple.  Cardiovascular:     Rate and Rhythm: Normal rate.  Pulmonary:     Effort: Pulmonary effort is normal.  Abdominal:     Palpations: Abdomen is soft.  Skin:    General: Skin is warm and dry.  Neurological:     Mental Status: She is alert and oriented to person, place, and time.         Assessment & Plan:   Problem List Items Addressed This Visit    None    Visit Diagnoses    Vaginal discharge    -  Primary   Check wet prep and treat accordingly   Relevant Orders   Cervicovaginal ancillary only( )   IUD check up       Does not desire removal at this time. Linked to depression, discussed. Could try hormone free IUD-bleeding profile discussed.    Flu shot  Total face-to-face time with patient: 15 minutes. Over 50% of  encounter was spent on counseling and coordination of care. Return in about 3 months (around 12/03/2019), or if symptoms worsen or fail to improve, for a follow-up.  Donnamae Jude 09/02/2019 3:19 PM

## 2019-09-04 LAB — CERVICOVAGINAL ANCILLARY ONLY
Bacterial Vaginitis (gardnerella): NEGATIVE
Candida Glabrata: NEGATIVE
Candida Vaginitis: NEGATIVE
Chlamydia: NEGATIVE
Comment: NEGATIVE
Comment: NEGATIVE
Comment: NEGATIVE
Comment: NEGATIVE
Comment: NEGATIVE
Comment: NORMAL
Neisseria Gonorrhea: NEGATIVE
Trichomonas: NEGATIVE

## 2019-09-16 ENCOUNTER — Encounter: Payer: Self-pay | Admitting: Adult Health

## 2019-09-16 ENCOUNTER — Ambulatory Visit (INDEPENDENT_AMBULATORY_CARE_PROVIDER_SITE_OTHER): Payer: BC Managed Care – PPO | Admitting: Adult Health

## 2019-09-16 ENCOUNTER — Other Ambulatory Visit: Payer: Self-pay

## 2019-09-16 DIAGNOSIS — G4700 Insomnia, unspecified: Secondary | ICD-10-CM

## 2019-09-16 DIAGNOSIS — F4310 Post-traumatic stress disorder, unspecified: Secondary | ICD-10-CM | POA: Diagnosis not present

## 2019-09-16 DIAGNOSIS — F411 Generalized anxiety disorder: Secondary | ICD-10-CM

## 2019-09-16 DIAGNOSIS — F331 Major depressive disorder, recurrent, moderate: Secondary | ICD-10-CM | POA: Diagnosis not present

## 2019-09-16 MED ORDER — VENLAFAXINE HCL ER 75 MG PO CP24: 75.0000 mg | ORAL_CAPSULE | Freq: Every day | ORAL | 1 refills | Status: DC

## 2019-09-16 NOTE — Progress Notes (Signed)
[facility] MD/PA/NP Medication Check  09/16/2019 1:48 PM Jill Osborne  MRN:  [id number]  Chief Complaint:    HPI:   Describes mood today as "ok". Pleasant. Flat. Denies tearfulness. Mood symptoms - denies depression, anxiety, and irritability. Stating "I think I'm doing ok". Feels more positive. Seeing therapist once a week. They are working on "relationship with parents". Mother will be present for next session. Denies self harm. Improved interest and motivation. Stating "things are going great". Taking medications as prescribed.  Energy levels "normal". Active, does have a regular exercise routine - going to gym 3 to 5 days a week. Unemployed currently.  Enjoys some usual interests and activities. Lives with parents and brother 59 and 16. Spent Thanksgiving with family. Friend in town - moving to [place] from [place]. Mostly staying at home. Appetite decreased. Eating once a day. Weight loss 10 pounds total. Not getting hungry.  Sleep has improved. Taking Melatonin 5mg  and doing meditation before bedtime. Sleeps 5 hours a night. Falls asleep - waking up every few hours. Denies nightmares - "maybe once".  Focus and concentration "about the same". Stating "not a lot going on during the day". Hard to get my "attention". Completing tasks. Managing aspects of household - "keeping things clean and tidy".  Denies SI or HI. Denies AH or VH. Seeing therapist weekly - .  Visit Diagnosis:    ICD-10-CM   1. Major depressive disorder, recurrent episode, moderate (HCC)  F33.1 venlafaxine XR (EFFEXOR-XR) 75 MG 24 hr capsule  2. Insomnia, unspecified type  G47.00 venlafaxine XR (EFFEXOR-XR) 75 MG 24 hr capsule  3. PTSD (post-traumatic stress disorder)  F43.10 venlafaxine XR (EFFEXOR-XR) 75 MG 24 hr capsule  4. Generalized anxiety disorder  F41.1 venlafaxine XR (EFFEXOR-XR) 75 MG 24 hr capsule    Past Psychiatric History: Hospitalized October [facility] at [facility] for 5 days -  attempted suicide by cutting wrists. Was inpatient for 5 days.   Past Medical History:  Past Medical History:  Diagnosis Date  . Asthma   . Depression   . Lumbar herniated disc   . Tetanus     Past Surgical History:  Procedure Laterality Date  . LAPAROSCOPIC APPENDECTOMY N/A 07/15/2016   Procedure: APPENDECTOMY LAPAROSCOPIC;  Surgeon: 07/17/2016, MD;  Location: [facility] ORS;  Service: General;  Laterality: N/A;    Family Psychiatric History: Denies  Family History:  Family History  Problem Relation Age of Onset  . Healthy Mother   . Healthy Father     Social History:  Social History   Socioeconomic History  . Marital status: Single    Spouse name: Not on file  . Number of children: Not on file  . Years of education: Not on file  . Highest education level: Not on file  Occupational History  . Not on file  Social Needs  . Financial resource strain: Not on file  . Food insecurity    Worry: Not on file    Inability: Not on file  . Transportation needs    Medical: Not on file    Non-medical: Not on file  Tobacco Use  . Smoking status: Never Smoker  . Smokeless tobacco: Never Used  Substance and Sexual Activity  . Alcohol use: No  . Drug use: No  . Sexual activity: Yes    Partners: Male    Birth control/protection: None, Condom  Lifestyle  . Physical activity    Days per week: Not on file    Minutes per session: Not  on file  . Stress: Not on file  Relationships  . Social Herbalist on phone: Not on file    Gets together: Not on file    Attends religious service: Not on file    Active member of club or organization: Not on file    Attends meetings of clubs or organizations: Not on file    Relationship status: Not on file  Other Topics Concern  . Not on file  Social History Narrative   Single.   Student at [employer], [employer].   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   Enjoys reading, spending time with friends.    Allergies:  Allergies  Allergen  Reactions  . Shellfish Allergy Hives  . Eggs Or Egg-Derived Products Nausea And Vomiting    Metabolic Disorder Labs: No results found for: HGBA1C, MPG No results found for: PROLACTIN No results found for: CHOL, TRIG, HDL, CHOLHDL, VLDL, LDLCALC Lab Results  Component Value Date   TSH 1.400 07/22/2019    Therapeutic Level Labs: No results found for: LITHIUM No results found for: VALPROATE No components found for:  CBMZ  Current Medications: Current Outpatient Medications  Medication Sig Dispense Refill  . venlafaxine XR (EFFEXOR-XR) 75 MG 24 hr capsule Take 1 capsule (75 mg total) by mouth daily with breakfast. 30 capsule 1   Current Facility-Administered Medications  Medication Dose Route Frequency Provider Last Rate Last Dose  . Levonorgestrel IUD 19.5 mg  1 each Intrauterine Continuous Donnamae Jude, MD   19.5 mg at 04/26/18 1030    Medication Side Effects: none  Orders placed this visit:  No orders of the defined types were placed in this encounter.   Psychiatric Specialty Exam:  Review of Systems  Musculoskeletal: Negative for falls.  Neurological: Negative for tremors and weakness.  Psychiatric/Behavioral: Negative for depression and suicidal ideas. The patient is not nervous/anxious and does not have insomnia.     Last menstrual period 08/18/2019.There is no height or weight on file to calculate BMI.  General Appearance: Neat and Well Groomed  Eye Contact:  Good  Speech:  Clear and Coherent  Volume:  Decreased  Mood:  Euthymic  Affect:  Congruent  Thought Process:  Coherent and Descriptions of Associations: Intact  Orientation:  Full (Time, Place, and Person)  Thought Content: Logical   Suicidal Thoughts:  No  Homicidal Thoughts:  No  Memory:  WNL  Judgement:  Fair  Insight:  Fair  Psychomotor Activity:  Normal  Concentration:  Concentration: Fair  Recall:  NA  Fund of Knowledge: Good  Language: Good  Assets:  Communication Skills Desire for  Improvement Financial Resources/Insurance Housing Intimacy Leisure Time Physical Health Resilience Social Support Talents/Skills Transportation Vocational/Educational  ADL's:  Intact  Cognition: WNL  Prognosis:  Good   Screenings: None  Receiving Psychotherapy: Yes   Treatment Plan/Recommendations:   Plan:  1. Effexor XR 75mg  daily  2. Melatonin 5mg  at hs prn sleep  Continue therapy  RTC 6 weeks  Patient advised to contact office with any questions, adverse effects, or acute worsening in signs and symptoms.     Aloha Gell, NP

## 2019-10-16 ENCOUNTER — Ambulatory Visit: Payer: BC Managed Care – PPO | Attending: Internal Medicine

## 2019-10-16 DIAGNOSIS — Z20828 Contact with and (suspected) exposure to other viral communicable diseases: Secondary | ICD-10-CM | POA: Diagnosis not present

## 2019-10-16 DIAGNOSIS — Z20822 Contact with and (suspected) exposure to covid-19: Secondary | ICD-10-CM

## 2019-10-17 LAB — NOVEL CORONAVIRUS, NAA: SARS-CoV-2, NAA: DETECTED — AB

## 2019-10-22 ENCOUNTER — Emergency Department: Payer: BC Managed Care – PPO

## 2019-10-22 ENCOUNTER — Other Ambulatory Visit: Payer: Self-pay

## 2019-10-22 ENCOUNTER — Encounter: Payer: Self-pay | Admitting: *Deleted

## 2019-10-22 ENCOUNTER — Emergency Department
Admission: EM | Admit: 2019-10-22 | Discharge: 2019-10-22 | Disposition: A | Discharge status: Home / Self Care | Payer: BC Managed Care – PPO | Attending: Student | Admitting: Student

## 2019-10-22 DIAGNOSIS — Z79899 Other long term (current) drug therapy: Secondary | ICD-10-CM | POA: Diagnosis not present

## 2019-10-22 DIAGNOSIS — J45909 Unspecified asthma, uncomplicated: Secondary | ICD-10-CM | POA: Insufficient documentation

## 2019-10-22 DIAGNOSIS — U071 COVID-19: Secondary | ICD-10-CM | POA: Insufficient documentation

## 2019-10-22 DIAGNOSIS — R0602 Shortness of breath: Secondary | ICD-10-CM | POA: Diagnosis not present

## 2019-10-22 DIAGNOSIS — R05 Cough: Secondary | ICD-10-CM | POA: Diagnosis not present

## 2019-10-22 MED ORDER — PREDNISONE 10 MG PO TABS: 50.0000 mg | ORAL_TABLET | Freq: Every day | ORAL | 0 refills | Status: DC

## 2019-10-22 MED ORDER — ALBUTEROL SULFATE (2.5 MG/3ML) 0.083% IN NEBU: 2.5000 mg | INHALATION_SOLUTION | Freq: Four times a day (QID) | RESPIRATORY_TRACT | 1 refills | Status: DC | PRN

## 2019-10-22 MED ORDER — ALBUTEROL SULFATE HFA 108 (90 BASE) MCG/ACT IN AERS: 2.0000 | INHALATION_SPRAY | RESPIRATORY_TRACT | 1 refills | Status: DC | PRN

## 2019-10-22 MED ORDER — PREDNISONE 20 MG PO TABS
60.0000 mg | ORAL_TABLET | Freq: Once | ORAL | Status: AC
  Administered 2019-10-22: 60 mg via ORAL
  Filled 2019-10-22: qty 3

## 2019-10-22 NOTE — ED Triage Notes (Signed)
Pt ambulatory to triage.  Pt has covid.  Pt reports cough, sob, fever, chills.  Pt alert  Speech clear.

## 2019-10-22 NOTE — ED Notes (Signed)
See triage note: pt presents with hx "asthma", c/o SHOB on exertion for 3 days. SHOB has gotten worse in the last 2 days while resting.  Temporary relief from Rx neubulizer by PCP. C/o cough, fever at home. Denies CP at this time.

## 2019-10-22 NOTE — Discharge Instructions (Signed)
Please follow up with primary care if not improving with medications. Return to the ER if your symptoms change or worsen and you are unable to schedule an appointment.

## 2019-10-22 NOTE — ED Provider Notes (Signed)
[facility] Emergency Department Provider Note  ____________________________________________  Time seen: Approximately 10:19 PM  I have reviewed the triage vital signs and the nursing notes.   HISTORY  Chief Complaint Cough   HPI Jill Osborne is a 19 y.o. female who presents to the emergency department for treatment and evaluation of shortness of breath with exertion for the past 3 days.  She was diagnosed with COVID-19 on October 17, 2019.  She has been using her nebulizer with temporary relief.  She denies any other symptoms such as fever, nausea, vomiting, diarrhea, loss of taste or smell.    Past Medical History:  Diagnosis Date  . Asthma   . Depression   . Lumbar herniated disc   . Tetanus     Patient Active Problem List   Diagnosis Date Noted  . MDD (major depressive disorder), recurrent severe, without psychosis ([place]) 07/23/2019    Past Surgical History:  Procedure Laterality Date  . LAPAROSCOPIC APPENDECTOMY N/A 07/15/2016   Procedure: APPENDECTOMY LAPAROSCOPIC;  Surgeon: Clayburn Pert, MD;  Location: [facility] ORS;  Service: General;  Laterality: N/A;    Prior to Admission medications   Medication Sig Start Date End Date Taking? Authorizing Provider  albuterol (PROVENTIL) (2.5 MG/3ML) 0.083% nebulizer solution Take 3 mLs (2.5 mg total) by nebulization every 6 (six) hours as needed for wheezing or shortness of breath. 10/22/19   Iziah Cates B, FNP  albuterol (VENTOLIN HFA) 108 (90 Base) MCG/ACT inhaler Inhale 2 puffs into the lungs every 4 (four) hours as needed for wheezing or shortness of breath. 10/22/19   Grenda Lora B, FNP  predniSONE (DELTASONE) 10 MG tablet Take 5 tablets (50 mg total) by mouth daily. 10/22/19   Byrne Capek, Dessa Phi, FNP  venlafaxine XR (EFFEXOR-XR) 75 MG 24 hr capsule Take 1 capsule (75 mg total) by mouth daily with breakfast. 09/16/19   [place], Berdie Ogren, NP    Allergies Shellfish allergy and Eggs or egg-derived  products  Family History  Problem Relation Age of Onset  . Healthy Mother   . Healthy Father     Social History Social History   Tobacco Use  . Smoking status: Never Smoker  . Smokeless tobacco: Never Used  Substance Use Topics  . Alcohol use: No  . Drug use: No    Review of Systems Constitutional: Negative for fever/chills.  Normal appetite. ENT: Negative for sore throat. Cardiovascular: Denies chest pain. Respiratory: Positive for shortness of breath.  Positive for cough.  Negative wheezing.  Gastrointestinal: Negative for nausea, no vomiting.  No diarrhea.  Musculoskeletal: Positive for body aches Skin: Negative for rash. Neurological: Negative for headaches ____________________________________________   PHYSICAL EXAM:  VITAL SIGNS: ED Triage Vitals  Enc Vitals Group     BP 10/22/19 2110 118/83     Pulse Rate 10/22/19 2110 (!) 104     Resp 10/22/19 2110 20     Temp 10/22/19 2110 99.4 F (37.4 C)     Temp Source 10/22/19 2110 Oral     SpO2 10/22/19 2110 99 %     Weight 10/22/19 2111 116 lb (52.6 kg)     Height 10/22/19 2111 5' (1.524 m)     Head Circumference --      Peak Flow --      Pain Score 10/22/19 2110 3     Pain Loc --      Pain Edu? --      Excl. in [place]? --     Constitutional: Alert  and oriented.  Overall well appearing and in no acute distress. Eyes: Conjunctivae are normal. Ears: Tympanic membranes are normal Nose: No sinus congestion noted; no rhinnorhea. Mouth/Throat: Mucous membranes are moist.  Oropharynx clear. Tonsils flat. Uvula midline. Neck: No stridor.  Lymphatic: No cervical lymphadenopathy. Cardiovascular: Normal rate, regular rhythm. Good peripheral circulation. Respiratory: Respirations are even and unlabored.  No retractions.  Breath sounds clear to auscultation. Gastrointestinal: Soft and nontender.  Musculoskeletal: FROM x 4 extremities.  Neurologic:  Normal speech and language. Skin:  Skin is warm, dry and intact. No rash  noted. Psychiatric: Mood and affect are normal. Speech and behavior are normal.  ____________________________________________   LABS (all labs ordered are listed, but only abnormal results are displayed)  Labs Reviewed - No data to display ____________________________________________  EKG  Not indicated ____________________________________________  RADIOLOGY  Chest x-ray negative for acute cardiopulmonary abnormality per radiology. ____________________________________________   PROCEDURES  Procedure(s) performed: None  Critical Care performed: No ____________________________________________   INITIAL IMPRESSION / ASSESSMENT AND PLAN / ED COURSE  19 y.o. female presenting to the emergency department for treatment and evaluation of shortness of breath in the setting of COVID-19.  Patient will be prescribed prednisone and refills for her albuterol nebulizer as well as inhaler will be written.  First dose of prednisone provided tonight.  Currently, she is not wheezing and her exam is overall reassuring.  Her oxygen saturation is 99% on room air.    Patient is to follow-up with her primary care provider or return to the emergency department for symptoms that change or worsen.    Medications  predniSONE (DELTASONE) tablet 60 mg (60 mg Oral Given 10/22/19 2222)    ED Discharge Orders         Ordered    albuterol (VENTOLIN HFA) 108 (90 Base) MCG/ACT inhaler  Every 4 hours PRN     10/22/19 2226    albuterol (PROVENTIL) (2.5 MG/3ML) 0.083% nebulizer solution  Every 6 hours PRN     10/22/19 2226    predniSONE (DELTASONE) 10 MG tablet  Daily     10/22/19 2226          Jill Osborne was evaluated in Emergency Department on 10/22/2019 for the symptoms described in the history of present illness. She was evaluated in the context of the global COVID-19 pandemic, which necessitated consideration that the patient might be at risk for infection with the SARS-CoV-2 virus that causes  COVID-19. Institutional protocols and algorithms that pertain to the evaluation of patients at risk for COVID-19 are in a state of rapid change based on information released by regulatory bodies including the CDC and federal and state organizations. These policies and algorithms were followed during the patient's care in the ED.   Pertinent labs & imaging results that were available during my care of the patient were reviewed by me and considered in my medical decision making (see chart for details).    If controlled substance prescribed during this visit, 12 month history viewed on the [facility] prior to issuing an initial prescription for Schedule II or III opiod. ____________________________________________   FINAL CLINICAL IMPRESSION(S) / ED DIAGNOSES  Final diagnoses:  COVID-19    Note:  This document was prepared using Dragon voice recognition software and may include unintentional dictation errors.    Chinita Pester, FNP 10/22/19 2228    Miguel Aschoff., MD 10/23/19 [phone]

## 2019-10-23 ENCOUNTER — Telehealth: Payer: Self-pay

## 2019-10-23 NOTE — Telephone Encounter (Signed)
Per chart review tab pt seen at [facility] ED on 10/22/19.

## 2019-10-23 NOTE — Telephone Encounter (Signed)
Noted, chart reviewed

## 2019-10-23 NOTE — Telephone Encounter (Signed)
[place] Night - Client TELEPHONE ADVICE RECORD [facility] Patient Name: Jill Osborne Gender: Female DOB: [birth year] Age: 19 Y 22 M 25 D Return Phone Number: [phone] (Primary), [phone] (Secondary) Address[place] [address] Client Jill Osborne [facility] [place] Night - Client Client Site [place] Physician Jill Osborne - NP Contact Type Call Who Is Calling Patient / Member / Family / Caregiver Call Type Triage / Clinical Caller Name rennie Relationship To Patient Mother Return Phone Number [phone] (Primary) Chief Complaint BREATHING - shortness of breath or sounds breathless Reason for Call Symptomatic / Request for [place] states her daughter tested positive for covid on the 30th and still has a fever of 100.1, cough, and shortness of breath. Translation No Nurse Assessment Nurse: Jill Dark, RN, Jill Osborne Date/Time (Eastern Time): 10/22/2019 8:27:21 PM Confirm and document reason for call. If symptomatic, describe symptoms. ---Caller states that her daughter tested positive for covid on the 30th. She has been having SOB at rest. She is asthmatic and has been using her albuterol nebulizer. Has the patient had close contact with a person known or suspected to have the novel coronavirus illness OR traveled / lives in area with major community spread (including international travel) in the last 14 days from the onset of symptoms? * If Asymptomatic, screen for exposure and travel within the last 14 days. ---Yes Does the patient have any new or worsening symptoms? ---Yes Will a triage be completed? ---Yes Related visit to physician within the last 2 weeks? ---No Does the PT have any chronic conditions? (i.e. diabetes, asthma, this includes High risk factors for pregnancy, etc.) ---Yes List chronic conditions. ---asthma, Is the patient pregnant or  possibly pregnant? (Ask all females between the ages of 39-55) ---No Is this a behavioral health or substance abuse call? ---No Guidelines Guideline Title Affirmed Question Affirmed Notes Nurse Date/Time (Eastern Time) Coronavirus (COVID-19) - Diagnosed or Suspected SEVERE or constant chest pain or pressure [place], RN, Jill Osborne 10/22/2019 8:29:20 PM PLEASE NOTE: All timestamps contained within this report are represented as [place] Standard Time. CONFIDENTIALTY NOTICE: This fax transmission is intended only for the addressee. It contains information that is legally privileged, confidential or otherwise protected from use or disclosure. If you are not the intended recipient, you are strictly prohibited from reviewing, disclosing, copying using or disseminating any of this information or taking any action in reliance on or regarding this information. If you have received this fax in error, please notify us immediately by telephone so that we can arrange for its return to [place]. Phone: [phone], Toll-Free: [phone], Fax: [phone] Page: 2 of 2 Call Id: [id number] Guidelines Guideline Title Affirmed Question Affirmed Notes Nurse Date/Time Jill Osborne Time) (Exception: mild central chest pain, present only when coughing) Disp. Time Jill Osborne Time) Disposition Final User 10/22/2019 8:24:23 PM Send to Urgent Queue Memory Argue 10/22/2019 8:37:33 PM Called On-Call Provider Jill Dark, RN, Jill Osborne 10/22/2019 8:36:58 PM Go to ED Now Yes Jill Dark, RN, Jill Osborne Disagree/Comply Comply Caller Understands Yes PreDisposition Call Doctor Care Advice Given Per Guideline GO TO ED NOW: * You need to be seen in the Emergency Department. * Go to the ED at ___________ [place] now. Drive carefully. CALL EMS 911 IF: * Severe difficulty breathing occurs * Lips or face turns blue * Confusion occurs. CARE ADVICE given per CORONAVIRUS (COVID-19) - DIAGNOSED OR SUSPECTED (Adult)  guideline. Comments User: Jill Hines, RN Date/Time Jill Osborne Time): 10/22/2019 8:39:06 PM  spoke with mom and informed her that the on call MD does agree that she needs to be seen in the ER. Caller aware and verbalized understanding. encouraged mom to call back PRN. Referrals [facility] - ED Paging DoctorName Phone DateTime Result/Outcome Message Type Notes Jill Osborne- MD [phone] 10/22/2019 8:37:33 PM Called On Call Provider - Reached Doctor Paged Jill Osborne- MD 10/22/2019 8:38:28 PM Spoke with On Call - General Message Result Spoke with Dr. Jonny Osborne and gave him report on patient. He agreed that the patient needs to be seen in the ER and wanted the nurse to pass along the message to mom. Will inform mom.

## 2019-10-28 ENCOUNTER — Ambulatory Visit (INDEPENDENT_AMBULATORY_CARE_PROVIDER_SITE_OTHER): Payer: BC Managed Care – PPO | Admitting: Adult Health

## 2019-10-28 ENCOUNTER — Encounter: Payer: Self-pay | Admitting: Adult Health

## 2019-10-28 ENCOUNTER — Other Ambulatory Visit: Payer: Self-pay

## 2019-10-28 DIAGNOSIS — F4310 Post-traumatic stress disorder, unspecified: Secondary | ICD-10-CM | POA: Diagnosis not present

## 2019-10-28 DIAGNOSIS — F411 Generalized anxiety disorder: Secondary | ICD-10-CM | POA: Diagnosis not present

## 2019-10-28 DIAGNOSIS — F331 Major depressive disorder, recurrent, moderate: Secondary | ICD-10-CM

## 2019-10-28 DIAGNOSIS — G4700 Insomnia, unspecified: Secondary | ICD-10-CM | POA: Diagnosis not present

## 2019-10-28 MED ORDER — VENLAFAXINE HCL ER 75 MG PO CP24: 75.0000 mg | ORAL_CAPSULE | Freq: Every day | ORAL | 2 refills | Status: DC

## 2019-10-28 NOTE — Progress Notes (Signed)
Jill Osborne [id number] [birth year] 19 y.o.  Subjective:   Patient ID:  Jill Osborne is a 19 y.o. (DOB 01-26-2001) female.  Chief Complaint: No chief complaint on file.   HPI Jill Osborne presents to the office today for follow-up of MDD, GAD, PTSD, and insomnia.   Describes mood today as "ok". Tearful. Flat. Denies tearfulness. Mood symptoms - reports depression, anxiety, and irritability. More "anxious" overall. Has missed taking medications for 3 days. Concerned about Covid. Feels like she is "stressed". Symptoms increased when signing up for classes. Feels overwhelmed with the changes. Feels "stressed" about starting classes again - plans to be part-time. 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 Reports having Covid-19 since last visit. Has recovered well. Feels like that worst part was losing her "smell". Has missed a few weeks of seeing therapist - "I plan to get back in". Reports self harm - cutting once on arm - "completely gone now". Decreased interest and motivation. Taking medications as prescribed.  Energy levels low. Active, does not have a regular exercise routine. Plans to get back to the gym when she can afford the membership.  Enjoys some usual interests and activities. Single. Lives with parents and brother 31 and  Sister 57. Spent holidays with family. Talking with friend. Mostly staying at home. Appetite decreased. Eats once daily. Weight loss. Sleeps better some nights than others. Sleeps 8 or more hours a day. Having some nightmares. Focus and concentration "slower". Completing tasks. Managing aspects of household. Distracted.  Denies SI or HI. Denies AH or VH. Seeing therapist weekly - Vic Ripper.   Review of Systems:  Review of Systems  Musculoskeletal: Negative for gait problem.  Neurological: Negative for tremors.  Psychiatric/Behavioral:       Please refer to HPI    Medications: I have reviewed the patient's current medications.  Current  Outpatient Medications  Medication Sig Dispense Refill  . albuterol (PROVENTIL) (2.5 MG/3ML) 0.083% nebulizer solution Take 3 mLs (2.5 mg total) by nebulization every 6 (six) hours as needed for wheezing or shortness of breath. 75 mL 1  . albuterol (VENTOLIN HFA) 108 (90 Base) MCG/ACT inhaler Inhale 2 puffs into the lungs every 4 (four) hours as needed for wheezing or shortness of breath. 8 g 1  . predniSONE (DELTASONE) 10 MG tablet Take 5 tablets (50 mg total) by mouth daily. 25 tablet 0  . venlafaxine XR (EFFEXOR-XR) 75 MG 24 hr capsule Take 1 capsule (75 mg total) by mouth daily with breakfast. 30 capsule 2   Current Facility-Administered Medications  Medication Dose Route Frequency Provider Last Rate Last Admin  . Levonorgestrel IUD 19.5 mg  1 each Intrauterine Continuous Reva Bores, MD   19.5 mg at 04/26/18 1030    Medication Side Effects: None  Allergies:  Allergies  Allergen Reactions  . Shellfish Allergy Hives  . Eggs Or Egg-Derived Products Nausea And Vomiting    Past Medical History:  Diagnosis Date  . Asthma   . Depression   . Lumbar herniated disc   . Tetanus     Family History  Problem Relation Age of Onset  . Healthy Mother   . Healthy Father     Social History   Socioeconomic History  . Marital status: Single    Spouse name: Not on file  . Number of children: Not on file  . Years of education: Not on file  . Highest education level: Not on file  Occupational History  . Not on  file  Tobacco Use  . Smoking status: Never Smoker  . Smokeless tobacco: Never Used  Substance and Sexual Activity  . Alcohol use: No  . Drug use: No  . Sexual activity: Yes    Partners: Male    Birth control/protection: None, Condom  Other Topics Concern  . Not on file  Social History Narrative   Single.   Student at [employer], [employer].   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   Enjoys reading, spending time with friends.   Social Determinants of Health   Financial  Resource Strain:   . Difficulty of Paying Living Expenses: Not on file  Food Insecurity:   . Worried About Charity fundraiser in the Last Year: Not on file  . Ran Out of Food in the Last Year: Not on file  Transportation Needs:   . Lack of Transportation (Medical): Not on file  . Lack of Transportation (Non-Medical): Not on file  Physical Activity:   . Days of Exercise per Week: Not on file  . Minutes of Exercise per Session: Not on file  Stress:   . Feeling of Stress : Not on file  Social Connections:   . Frequency of Communication with Friends and Family: Not on file  . Frequency of Social Gatherings with Friends and Family: Not on file  . Attends Religious Services: Not on file  . Active Member of Clubs or Organizations: Not on file  . Attends Archivist Meetings: Not on file  . Marital Status: Not on file  Intimate Partner Violence:   . Fear of Current or Ex-Partner: Not on file  . Emotionally Abused: Not on file  . Physically Abused: Not on file  . Sexually Abused: Not on file    Past Medical History, Surgical history, Social history, and Family history were reviewed and updated as appropriate.   Please see review of systems for further details on the patient's review from today.   Objective:   Physical Exam:  LMP  (LMP Unknown)   Physical Exam Constitutional:      General: She is not in acute distress.    Appearance: She is well-developed.  Musculoskeletal:        General: No deformity.  Neurological:     Mental Status: She is alert and oriented to person, place, and time.     Coordination: Coordination normal.  Psychiatric:        Attention and Perception: Attention and perception normal. She does not perceive auditory or visual hallucinations.        Mood and Affect: Mood is anxious and depressed. Affect is tearful. Affect is not labile, blunt, angry or inappropriate.        Speech: Speech normal.        Behavior: Behavior normal.        Thought  Content: Thought content normal. Thought content is not paranoid or delusional. Thought content does not include homicidal or suicidal ideation. Thought content does not include homicidal or suicidal plan.        Cognition and Memory: Cognition and memory normal.        Judgment: Judgment normal.     Comments: Insight intact     Lab Review:     Component Value Date/Time   NA 137 07/22/2019 1923   K 3.7 07/22/2019 1923   CL 102 07/22/2019 1923   CO2 25 07/22/2019 1923   GLUCOSE 86 07/22/2019 1923   BUN 9 07/22/2019 1923   CREATININE 0.59  07/22/2019 1923   CALCIUM 9.6 07/22/2019 1923   PROT 8.1 07/22/2019 1923   ALBUMIN 4.7 07/22/2019 1923   AST 26 07/22/2019 1923   ALT 30 07/22/2019 1923   ALKPHOS 56 07/22/2019 1923   BILITOT 0.9 07/22/2019 1923   GFRNONAA >60 07/22/2019 1923   GFRAA >60 07/22/2019 1923       Component Value Date/Time   WBC 7.4 07/22/2019 1923   RBC 4.55 07/22/2019 1923   HGB 13.8 07/22/2019 1923   HCT 40.9 07/22/2019 1923   PLT 269 07/22/2019 1923   MCV 89.9 07/22/2019 1923   MCH 30.3 07/22/2019 1923   MCHC 33.7 07/22/2019 1923   RDW 12.8 07/22/2019 1923   LYMPHSABS 1.3 03/06/2019 1918   MONOABS 0.4 03/06/2019 1918   EOSABS 0.1 03/06/2019 1918   BASOSABS 0.0 03/06/2019 1918    No results found for: POCLITH, LITHIUM   No results found for: PHENYTOIN, PHENOBARB, VALPROATE, CBMZ   .res Assessment: Plan:    Plan:  1. Effexor XR 75mg  daily - will take more consistently 2. Melatonin 5mg  at hs prn sleep  Continue therapy - - plants to restart  RTC 4 weeks  Patient advised to contact office with any questions, adverse effects, or acute worsening in signs and symptoms.   Diagnoses and all orders for this visit:  Major depressive disorder, recurrent episode, moderate (HCC) -     venlafaxine XR (EFFEXOR-XR) 75 MG 24 hr capsule; Take 1 capsule (75 mg total) by mouth daily with breakfast.  Insomnia, unspecified type -      venlafaxine XR (EFFEXOR-XR) 75 MG 24 hr capsule; Take 1 capsule (75 mg total) by mouth daily with breakfast.  Generalized anxiety disorder -     venlafaxine XR (EFFEXOR-XR) 75 MG 24 hr capsule; Take 1 capsule (75 mg total) by mouth daily with breakfast.  PTSD (post-traumatic stress disorder) -     venlafaxine XR (EFFEXOR-XR) 75 MG 24 hr capsule; Take 1 capsule (75 mg total) by mouth daily with breakfast.     Please see After Visit Summary for patient specific instructions.  No future appointments.  No orders of the defined types were placed in this encounter.   -------------------------------

## 2019-11-04 ENCOUNTER — Telehealth: Payer: Self-pay | Admitting: Adult Health

## 2019-11-04 NOTE — Telephone Encounter (Signed)
error 

## 2019-11-20 DIAGNOSIS — K0500 Acute gingivitis, plaque induced: Secondary | ICD-10-CM | POA: Diagnosis not present

## 2019-11-20 DIAGNOSIS — Z1281 Encounter for screening for malignant neoplasm of oral cavity: Secondary | ICD-10-CM | POA: Diagnosis not present

## 2019-11-20 DIAGNOSIS — K036 Deposits [accretions] on teeth: Secondary | ICD-10-CM | POA: Diagnosis not present

## 2019-11-25 ENCOUNTER — Ambulatory Visit: Payer: BC Managed Care – PPO | Admitting: Adult Health

## 2020-01-11 ENCOUNTER — Other Ambulatory Visit: Payer: Self-pay

## 2020-01-11 ENCOUNTER — Emergency Department
Admission: EM | Admit: 2020-01-11 | Discharge: 2020-01-11 | Disposition: A | Discharge status: Home / Self Care | Payer: BC Managed Care – PPO | Attending: Student | Admitting: Student

## 2020-01-11 ENCOUNTER — Emergency Department: Payer: BC Managed Care – PPO

## 2020-01-11 DIAGNOSIS — R5381 Other malaise: Secondary | ICD-10-CM | POA: Insufficient documentation

## 2020-01-11 DIAGNOSIS — Z79899 Other long term (current) drug therapy: Secondary | ICD-10-CM | POA: Diagnosis not present

## 2020-01-11 DIAGNOSIS — R531 Weakness: Secondary | ICD-10-CM | POA: Diagnosis not present

## 2020-01-11 DIAGNOSIS — J45909 Unspecified asthma, uncomplicated: Secondary | ICD-10-CM | POA: Insufficient documentation

## 2020-01-11 DIAGNOSIS — F1210 Cannabis abuse, uncomplicated: Secondary | ICD-10-CM | POA: Diagnosis not present

## 2020-01-11 DIAGNOSIS — R42 Dizziness and giddiness: Secondary | ICD-10-CM | POA: Diagnosis not present

## 2020-01-11 DIAGNOSIS — E639 Nutritional deficiency, unspecified: Secondary | ICD-10-CM | POA: Insufficient documentation

## 2020-01-11 DIAGNOSIS — R4182 Altered mental status, unspecified: Secondary | ICD-10-CM | POA: Diagnosis not present

## 2020-01-11 DIAGNOSIS — R55 Syncope and collapse: Secondary | ICD-10-CM | POA: Insufficient documentation

## 2020-01-11 DIAGNOSIS — R5383 Other fatigue: Secondary | ICD-10-CM | POA: Diagnosis not present

## 2020-01-11 LAB — URINALYSIS, COMPLETE (UACMP) WITH MICROSCOPIC
Bacteria, UA: NONE SEEN
Bilirubin Urine: NEGATIVE
Glucose, UA: NEGATIVE mg/dL
Hgb urine dipstick: NEGATIVE
Ketones, ur: NEGATIVE mg/dL
Leukocytes,Ua: NEGATIVE
Nitrite: NEGATIVE
Protein, ur: 30 mg/dL — AB
Specific Gravity, Urine: 1.025 (ref 1.005–1.030)
pH: 7 (ref 5.0–8.0)

## 2020-01-11 LAB — CBC
HCT: 43.2 % (ref 36.0–46.0)
Hemoglobin: 14.4 g/dL (ref 12.0–15.0)
MCH: 30.4 pg (ref 26.0–34.0)
MCHC: 33.3 g/dL (ref 30.0–36.0)
MCV: 91.1 fL (ref 80.0–100.0)
Platelets: 277 10*3/uL (ref 150–400)
RBC: 4.74 MIL/uL (ref 3.87–5.11)
RDW: 12.5 % (ref 11.5–15.5)
WBC: 5.7 10*3/uL (ref 4.0–10.5)
nRBC: 0 % (ref 0.0–0.2)

## 2020-01-11 LAB — BASIC METABOLIC PANEL
Anion gap: 7 (ref 5–15)
BUN: 8 mg/dL (ref 6–20)
CO2: 27 mmol/L (ref 22–32)
Calcium: 9.9 mg/dL (ref 8.9–10.3)
Chloride: 106 mmol/L (ref 98–111)
Creatinine, Ser: 0.66 mg/dL (ref 0.44–1.00)
GFR calc Af Amer: >60 mL/min (ref >60)
GFR calc non Af Amer: >60 mL/min (ref >60)
Glucose, Bld: 99 mg/dL (ref 70–99)
Potassium: 3.9 mmol/L (ref 3.5–5.1)
Sodium: 140 mmol/L (ref 135–145)

## 2020-01-11 LAB — TROPONIN I (HIGH SENSITIVITY): Troponin I (High Sensitivity): <2 ng/L (ref <18)

## 2020-01-11 LAB — URINE DRUG SCREEN, QUALITATIVE (ARMC ONLY)
Amphetamines, Ur Screen: NOT DETECTED
Barbiturates, Ur Screen: NOT DETECTED
Benzodiazepine, Ur Scrn: NOT DETECTED
Cannabinoid 50 Ng, Ur ~~LOC~~: POSITIVE — AB
Cocaine Metabolite,Ur ~~LOC~~: NOT DETECTED
MDMA (Ecstasy)Ur Screen: NOT DETECTED
Methadone Scn, Ur: NOT DETECTED
Opiate, Ur Screen: NOT DETECTED
Phencyclidine (PCP) Ur S: NOT DETECTED
Tricyclic, Ur Screen: NOT DETECTED

## 2020-01-11 LAB — POCT PREGNANCY, URINE: Preg Test, Ur: NEGATIVE

## 2020-01-11 LAB — MONONUCLEOSIS SCREEN: Mono Screen: NEGATIVE

## 2020-01-11 MED ORDER — SODIUM CHLORIDE 0.9 % IV BOLUS
1000.0000 mL | Freq: Once | INTRAVENOUS | Status: AC
  Administered 2020-01-11: 1000 mL via INTRAVENOUS

## 2020-01-11 NOTE — ED Triage Notes (Signed)
Pt states that she has been tired lately. All she does it sleep. Pt states that her head hurts and she's dizzy. Pt states that while at work yesterday she "fell asleep and hit the floor".

## 2020-01-11 NOTE — ED Notes (Signed)
Patient transported to imaging.

## 2020-01-11 NOTE — ED Provider Notes (Signed)
[facility] Emergency Department Provider Note  ____________________________________________  Time seen: Approximately 6:39 PM  I have reviewed the triage vital signs and the nursing notes.   HISTORY  Chief Complaint Fatigue    HPI Jill Osborne is a 19 y.o. female who presents the emergency department complaining of weakness, tiredness, near syncopal episode yesterday.  According to the patient, over the past several days she has been feeling increasingly weak.  Mother reports that symptoms have been ongoing for much longer but patient states that they have been acute over the last several days.  Patient states that yesterday she was sitting down, unsure whether the patient fell asleep, fell out of the chair when she had a true syncopal episode.  She states that she remembers being "startled" as she was falling out of a chair.  At that time patient did not sustain any injuries.  Mother reports that the patient's appetite has been severely limited, which is a known side effect of one of her psychiatric medications.  She had a decreased appetite for several months.  Patient has been losing weight steadily and both primary care and psychiatry are aware and attempting to manage the symptoms.  Patient denies any fevers or chills, nasal congestion, sore throat, cough, chest pain abdominal pain.  Patient has had a left-sided headache for several days.  Patient took a dose of ibuprofen without significant relief.  No other complaints at this time.         Past Medical History:  Diagnosis Date  . Asthma   . Depression   . Lumbar herniated disc   . Tetanus     Patient Active Problem List   Diagnosis Date Noted  . MDD (major depressive disorder), recurrent severe, without psychosis (HCC) 07/23/2019    Past Surgical History:  Procedure Laterality Date  . LAPAROSCOPIC APPENDECTOMY N/A 07/15/2016   Procedure: APPENDECTOMY LAPAROSCOPIC;  Surgeon: Ricarda Frame, MD;   Location: [facility] ORS;  Service: General;  Laterality: N/A;    Prior to Admission medications   Medication Sig Start Date End Date Taking? Authorizing Provider  albuterol (PROVENTIL) (2.5 MG/3ML) 0.083% nebulizer solution Take 3 mLs (2.5 mg total) by nebulization every 6 (six) hours as needed for wheezing or shortness of breath. 10/22/19   Triplett, Cari B, FNP  albuterol (VENTOLIN HFA) 108 (90 Base) MCG/ACT inhaler Inhale 2 puffs into the lungs every 4 (four) hours as needed for wheezing or shortness of breath. 10/22/19   Triplett, Cari B, FNP  predniSONE (DELTASONE) 10 MG tablet Take 5 tablets (50 mg total) by mouth daily. 10/22/19   Triplett, Kasandra Knudsen, FNP  venlafaxine XR (EFFEXOR-XR) 75 MG 24 hr capsule Take 1 capsule (75 mg total) by mouth daily with breakfast. 10/28/19   Mozingo, Thereasa Solo, NP    Allergies Shellfish allergy and Eggs or egg-derived products  Family History  Problem Relation Age of Onset  . Healthy Mother   . Healthy Father     Social History Social History   Tobacco Use  . Smoking status: Never Smoker  . Smokeless tobacco: Never Used  Substance Use Topics  . Alcohol use: No  . Drug use: No     Review of Systems  Constitutional: No fever/chills.  Positive for increased malaise, decreased appetite Eyes: No visual changes. No discharge ENT: No upper respiratory complaints. Cardiovascular: no chest pain. Respiratory: no cough. No SOB. Gastrointestinal: No abdominal pain.  No nausea, no vomiting.  No diarrhea.  No constipation. Genitourinary: Negative  for dysuria. No hematuria Musculoskeletal: Negative for musculoskeletal pain. Skin: Negative for rash, abrasions, lacerations, ecchymosis. Neurological: Positive for left-sided headache, denies focal weakness or numbness. 10-point ROS otherwise negative.  ____________________________________________   PHYSICAL EXAM:  VITAL SIGNS: ED Triage Vitals  Enc Vitals Group     BP 01/11/20 1655 123/79     Pulse  Rate 01/11/20 1655 86     Resp 01/11/20 1655 18     Temp 01/11/20 1655 98.5 F (36.9 C)     Temp Source 01/11/20 1655 Oral     SpO2 01/11/20 1655 100 %     Weight 01/11/20 1653 130 lb (59 kg)     Height 01/11/20 1653 5' (1.524 m)     Head Circumference --      Peak Flow --      Pain Score 01/11/20 1653 0     Pain Loc --      Pain Edu? --      Excl. in GC? --      Constitutional: Alert and oriented. Well appearing and in no acute distress. Eyes: Conjunctivae are normal. PERRL. EOMI. Head: Atraumatic. ENT:      Ears:       Nose: No congestion/rhinnorhea.      Mouth/Throat: Mucous membranes are moist.  Neck: No stridor.  Neck is supple full range of motion Hematological/Lymphatic/Immunilogical: No cervical lymphadenopathy. Cardiovascular: Normal rate, regular rhythm. Normal S1 and S2.  Good peripheral circulation. Respiratory: Normal respiratory effort without tachypnea or retractions. Lungs CTAB. Good air entry to the bases with no decreased or absent breath sounds. Gastrointestinal: Bowel sounds 4 quadrants. Soft and nontender to palpation. No guarding or rigidity. No palpable masses. No distention. No CVA tenderness. Musculoskeletal: Full range of motion to all extremities. No gross deformities appreciated. Neurologic:  Normal speech and language. No gross focal neurologic deficits are appreciated.  Skin:  Skin is warm, dry and intact. No rash noted. Psychiatric: Mood and affect are normal. Speech and behavior are normal. Patient exhibits appropriate insight and judgement.   ____________________________________________   LABS (all labs ordered are listed, but only abnormal results are displayed)  Labs Reviewed  URINALYSIS, COMPLETE (UACMP) WITH MICROSCOPIC - Abnormal; Notable for the following components:      Result Value   Color, Urine AMBER (*)    APPearance CLOUDY (*)    Protein, ur 30 (*)    All other components within normal limits  URINE DRUG SCREEN, QUALITATIVE  ([facility] ONLY) - Abnormal; Notable for the following components:   Cannabinoid 50 Ng, Ur [place] POSITIVE (*)    All other components within normal limits  BASIC METABOLIC PANEL  CBC  MONONUCLEOSIS SCREEN  POC URINE PREG, ED  POCT PREGNANCY, URINE  CBG MONITORING, ED  TROPONIN I (HIGH SENSITIVITY)   ____________________________________________  EKG   ____________________________________________  RADIOLOGY I personally viewed and evaluated these images as part of my medical decision making, as well as reviewing the written report by the radiologist.  Discharge is  CT Head Wo Contrast  Result Date: 01/11/2020 CLINICAL DATA:  Fatigue, dizziness, mental status change EXAM: CT HEAD WITHOUT CONTRAST TECHNIQUE: Contiguous axial images were obtained from the base of the skull through the vertex without intravenous contrast. COMPARISON:  03/06/2019 FINDINGS: Brain: No acute infarct or hemorrhage. Lateral ventricles and midline structures are unremarkable. No acute extra-axial fluid collections. No mass effect. Vascular: No hyperdense vessel or unexpected calcification. Skull: Normal. Negative for fracture or focal lesion. Sinuses/Orbits: No acute finding. Other: None  IMPRESSION: 1. Stable head CT, no acute intracranial process. Electronically Signed   By: Randa Ngo M.D.   On: 01/11/2020 19:50    ____________________________________________    PROCEDURES  Procedure(s) performed:    Procedures    Medications  sodium chloride 0.9 % bolus 1,000 mL (1,000 mLs Intravenous New Bag/Given 01/11/20 2015)     ____________________________________________   INITIAL IMPRESSION / ASSESSMENT AND PLAN / ED COURSE  Pertinent labs & imaging results that were available during my care of the patient were reviewed by me and considered in my medical decision making (see chart for details).  Review of the [place] CSRS was performed in accordance of the [place] prior to dispensing any controlled drugs.            Patient's diagnosis is consistent with malaise.  Patient presents emergency department complaining of weakness, tiredness, near syncopal event yesterday.  Patient has been eating less, losing weight over several months.  This past been attributed to the patient's side effect of Effexor.  Patient has steadily decreased her intake of food.  According to the mother the patient had half of a grilled cheese, half of a hamburger in the last 4 days.  Patient became exceptionally weak feeling yesterday.  Patient was feeling very "off" earlier today and patient's parents brought her in for evaluation.  Patient is exam is reassuring.  Labs, imaging is reassuring.  I feel the symptoms are likely secondary to poor dietary intake due to decreased appetite from her psychiatric medications.  I have offered the mother and the patient several strategies to help both in good nutritional intake as well as strategies to combat side effects of her medication.  I also recommend talking to her psychiatrist about possibly changing her medication regimen to assist with poor dietary intake.  At this time patient is stable for discharge.  Follow-up primary care or psychiatry as needed. Patient is given ED precautions to return to the ED for any worsening or new symptoms.     ____________________________________________  FINAL CLINICAL IMPRESSION(S) / ED DIAGNOSES  Final diagnoses:  Malaise and fatigue  Poor diet      NEW MEDICATIONS STARTED DURING THIS VISIT:  ED Discharge Orders    None          This chart was dictated using voice recognition software/Dragon. Despite best efforts to proofread, errors can occur which can change the meaning. Any change was purely unintentional.    Darletta Moll, PA-C 01/11/20 2121    Lilia Pro., MD 01/12/20 [phone]

## 2020-01-11 NOTE — ED Notes (Signed)
Per Triage nurse report, patient was able to transfer from wheelchair to stretcher with one-person assist. Patient requested a warm blanket. Patient has visitor at bedside.

## 2020-01-13 ENCOUNTER — Encounter: Payer: Self-pay | Admitting: Primary Care

## 2020-01-13 ENCOUNTER — Other Ambulatory Visit: Payer: Self-pay

## 2020-01-13 ENCOUNTER — Ambulatory Visit: Payer: BC Managed Care – PPO | Admitting: Primary Care

## 2020-01-13 VITALS — BP 100/60 | HR 90 | Temp 97.0°F | Ht 60.0 in | Wt 135.5 lb

## 2020-01-13 DIAGNOSIS — F332 Major depressive disorder, recurrent severe without psychotic features: Secondary | ICD-10-CM

## 2020-01-13 DIAGNOSIS — R5383 Other fatigue: Secondary | ICD-10-CM

## 2020-01-13 DIAGNOSIS — R634 Abnormal weight loss: Secondary | ICD-10-CM | POA: Insufficient documentation

## 2020-01-13 HISTORY — DX: Other fatigue: R53.83

## 2020-01-13 HISTORY — DX: Abnormal weight loss: R63.4

## 2020-01-13 LAB — TSH: TSH: 1.4 u[IU]/mL (ref 0.35–5.50)

## 2020-01-13 NOTE — Patient Instructions (Signed)
Stop by the lab prior to leaving today. I will notify you of your results once received.   Do not sleep more than 9 hours everyday. Work hard to wake up during the day.   Please contact Rene Kocher as discussed.  You will be contacted regarding your referral to therapy.  Please let us know if you have not been contacted within two weeks.   It was a pleasure to see you today!

## 2020-01-13 NOTE — Assessment & Plan Note (Signed)
Suspect this is a combination of very little nutrition intake and sleeping too much.   Discussed to get no more than 9 hours of sleep daily. Discussed the absolute need to eat healthier meals, increase better nutrition.  Repeat TSH pending, other labs reviewed and unremarkable.

## 2020-01-13 NOTE — Assessment & Plan Note (Signed)
Improved on venlafaxine treatment but do suspect this is contributing to her decreased appetite.   Will cc her psychiatry provider as Lorain Childes. Labs from recent ED visit benign. Repeat TSH today.  Encouraged her to connect with her psychiatry provider to discuss the abrupt weight loss. She will call. I will also place a referral to therapy per patient and mother's request.   She denies SI/HI, eating disorder. Will need to monitor for eating disorder.

## 2020-01-13 NOTE — Progress Notes (Signed)
Subjective:    Patient ID: Jill Osborne, female    DOB: 12-12-2000, 18 y.o.   MRN: [id number]  HPI  This visit occurred during the SARS-CoV-2 public health emergency.  Safety protocols were in place, including screening questions prior to the visit, additional usage of staff PPE, and extensive cleaning of exam room while observing appropriate contact time as indicated for disinfecting solutions.   Jill Osborne is a 19 year old female with a history of MDD who presents today for ED follow up  She presented to [facility] ED on 01/11/20 with a chief complaint of fatigue, tiredness, near syncopal episode the day prior. Chronic symptoms. The day prior the patient was sitting in a chair and feel out of the chair. Admitted to a poor diet, very little food intake which was attributed to Effexor use. Follows with psychiatry.   During Jill stay in the ED she underwent lab work which was grossly unremarkable. She was discharged home later that day.   Today she endorses a hard time "keeping my eyes open", decreased appetite, doesn't feel hungry, increased sleep. She has been on venlafaxine since October 2020, helps with depression but symptoms of change in appetite began. She had Covid in December 2020, continues to have nausea with certain food smells, especially protein.   Jill mother reports that Jill Osborne is working late hours at [employer] and will get home around 1 am. She will then spend the following day sleeping all day, anywhere from 12-18 hours daily. She weighed 162 pounds in October 2020, 146 in October 2019. She is hardly eating anything, maybe one snack daily, no full meals. She denies a history of anorexia and bulmia. She denies palpitations, shortness of breath, amenorrhea. She does notice dizziness at times.   Wt Readings from Last 3 Encounters:  01/13/20 135 lb 8 oz (61.5 kg)  01/11/20 130 lb (59 kg)  10/22/19 116 lb (52.6 kg)     Review of Systems  Constitutional: Positive for fatigue.    Respiratory: Negative for shortness of breath.   Cardiovascular: Negative for chest pain and palpitations.  Genitourinary: Negative for menstrual problem.  Psychiatric/Behavioral: The patient is not nervous/anxious.        Past Medical History:  Diagnosis Date  . Asthma   . Depression   . Lumbar herniated disc   . Tetanus      Social History   Socioeconomic History  . Marital status: Single    Spouse name: Not on file  . Number of children: Not on file  . Years of education: Not on file  . Highest education level: Not on file  Occupational History  . Not on file  Tobacco Use  . Smoking status: Never Smoker  . Smokeless tobacco: Never Used  Substance and Sexual Activity  . Alcohol use: No  . Drug use: No  . Sexual activity: Yes    Partners: Male    Birth control/protection: None, Condom  Other Topics Concern  . Not on file  Social History Narrative   Single.   Student at [employer], [employer].   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   Enjoys reading, spending time with friends.   Social Determinants of Health   Financial Resource Strain:   . Difficulty of Paying Living Expenses:   Food Insecurity:   . Worried About Programme researcher, broadcasting/film/video in the Last Year:   . Barista in the Last Year:   Transportation Needs:   . Lack  of Transportation (Medical):   Marland Kitchen Lack of Transportation (Non-Medical):   Physical Activity:   . Days of Exercise per Week:   . Minutes of Exercise per Session:   Stress:   . Feeling of Stress :   Social Connections:   . Frequency of Communication with Friends and Family:   . Frequency of Social Gatherings with Friends and Family:   . Attends Religious Services:   . Active Member of Clubs or Organizations:   . Attends Archivist Meetings:   Marland Kitchen Marital Status:   Intimate Partner Violence:   . Fear of Current or Ex-Partner:   . Emotionally Abused:   Marland Kitchen Physically Abused:   . Sexually Abused:     Past Surgical History:  Procedure  Laterality Date  . LAPAROSCOPIC APPENDECTOMY N/A 07/15/2016   Procedure: APPENDECTOMY LAPAROSCOPIC;  Surgeon: Clayburn Pert, MD;  Location: [facility] ORS;  Service: General;  Laterality: N/A;    Family History  Problem Relation Age of Onset  . Healthy Mother   . Healthy Father     Allergies  Allergen Reactions  . Shellfish Allergy Hives  . Eggs Or Egg-Derived Products Nausea And Vomiting    Current Outpatient Medications on File Prior to Visit  Medication Sig Dispense Refill  . albuterol (PROVENTIL) (2.5 MG/3ML) 0.083% nebulizer solution Take 3 mLs (2.5 mg total) by nebulization every 6 (six) hours as needed for wheezing or shortness of breath. 75 mL 1  . albuterol (VENTOLIN HFA) 108 (90 Base) MCG/ACT inhaler Inhale 2 puffs into the lungs every 4 (four) hours as needed for wheezing or shortness of breath. 8 g 1  . predniSONE (DELTASONE) 10 MG tablet Take 5 tablets (50 mg total) by mouth daily. 25 tablet 0  . venlafaxine XR (EFFEXOR-XR) 75 MG 24 hr capsule Take 1 capsule (75 mg total) by mouth daily with breakfast. 30 capsule 2   Current Facility-Administered Medications on File Prior to Visit  Medication Dose Route Frequency Provider Last Rate Last Admin  . Levonorgestrel IUD 19.5 mg  1 each Intrauterine Continuous Donnamae Jude, MD   19.5 mg at 04/26/18 1030    BP 100/60   Pulse 90   Temp (!) 97 F (36.1 C) (Temporal)   Ht 5' (1.524 m)   Wt 135 lb 8 oz (61.5 kg)   SpO2 100%   BMI 26.46 kg/m    Objective:   Physical Exam  Constitutional: She appears well-nourished.  Cardiovascular: Normal rate and regular rhythm.  Respiratory: Effort normal and breath sounds normal.  Musculoskeletal:     Cervical back: Neck supple.  Skin: Skin is warm and dry.  Psychiatric: She has a normal mood and affect.           Assessment & Plan:

## 2020-01-13 NOTE — Assessment & Plan Note (Signed)
Suspect this is mostly secondary to side effect of venlafaxine, but given her age we need to be monitoring for eating disorder.   Will contact her psychiatry provider as FYI.   Repeat TSH pending, all other labs unremarkable.

## 2020-01-15 ENCOUNTER — Ambulatory Visit: Payer: BC Managed Care – PPO | Attending: Internal Medicine

## 2020-01-15 DIAGNOSIS — Z23 Encounter for immunization: Secondary | ICD-10-CM

## 2020-01-15 NOTE — Progress Notes (Signed)
   Covid-19 Vaccination Clinic  Name:  Jill Osborne    MRN: [id number] DOB: 08-23-2001  01/15/2020  Jill Osborne was observed post Covid-19 immunization for 15 minutes without incident. She was provided with Vaccine Information Sheet and instruction to access the V-Safe system.   Jill Osborne was instructed to call 911 with any severe reactions post vaccine: Marland Kitchen Difficulty breathing  . Swelling of face and throat  . A fast heartbeat  . A bad rash all over body  . Dizziness and weakness   Immunizations Administered    Name Date Dose VIS Date Route   Pfizer COVID-19 Vaccine 01/15/2020 11:38 AM 0.3 mL 09/27/2019 Intramuscular   Manufacturer: [employer], [employer]   Lot: [id number]   NDC: [address]-9

## 2020-01-22 ENCOUNTER — Encounter: Payer: Self-pay | Admitting: Emergency Medicine

## 2020-01-22 ENCOUNTER — Other Ambulatory Visit: Payer: Self-pay

## 2020-01-22 ENCOUNTER — Emergency Department
Admission: EM | Admit: 2020-01-22 | Discharge: 2020-01-22 | Disposition: A | Discharge status: Home / Self Care | Payer: BC Managed Care – PPO | Attending: Emergency Medicine | Admitting: Emergency Medicine

## 2020-01-22 DIAGNOSIS — Z79899 Other long term (current) drug therapy: Secondary | ICD-10-CM | POA: Insufficient documentation

## 2020-01-22 DIAGNOSIS — F1290 Cannabis use, unspecified, uncomplicated: Secondary | ICD-10-CM | POA: Insufficient documentation

## 2020-01-22 DIAGNOSIS — F331 Major depressive disorder, recurrent, moderate: Secondary | ICD-10-CM | POA: Diagnosis not present

## 2020-01-22 DIAGNOSIS — R55 Syncope and collapse: Secondary | ICD-10-CM | POA: Diagnosis not present

## 2020-01-22 DIAGNOSIS — J45909 Unspecified asthma, uncomplicated: Secondary | ICD-10-CM | POA: Insufficient documentation

## 2020-01-22 DIAGNOSIS — R4182 Altered mental status, unspecified: Secondary | ICD-10-CM | POA: Diagnosis not present

## 2020-01-22 DIAGNOSIS — R404 Transient alteration of awareness: Secondary | ICD-10-CM | POA: Diagnosis not present

## 2020-01-22 DIAGNOSIS — F332 Major depressive disorder, recurrent severe without psychotic features: Secondary | ICD-10-CM | POA: Diagnosis not present

## 2020-01-22 DIAGNOSIS — G4489 Other headache syndrome: Secondary | ICD-10-CM | POA: Diagnosis not present

## 2020-01-22 DIAGNOSIS — R634 Abnormal weight loss: Secondary | ICD-10-CM | POA: Insufficient documentation

## 2020-01-22 DIAGNOSIS — R4020 Unspecified coma: Secondary | ICD-10-CM | POA: Diagnosis not present

## 2020-01-22 LAB — SALICYLATE LEVEL: Salicylate Lvl: <7 mg/dL — ABNORMAL LOW (ref 7.0–30.0)

## 2020-01-22 LAB — CBC WITH DIFFERENTIAL/PLATELET
Abs Immature Granulocytes: 0.01 10*3/uL (ref 0.00–0.07)
Basophils Absolute: 0.1 10*3/uL (ref 0.0–0.1)
Basophils Relative: 1 %
Eosinophils Absolute: 0.1 10*3/uL (ref 0.0–0.5)
Eosinophils Relative: 1 %
HCT: 43.7 % (ref 36.0–46.0)
Hemoglobin: 14.4 g/dL (ref 12.0–15.0)
Immature Granulocytes: 0 %
Lymphocytes Relative: 28 %
Lymphs Abs: 1.5 10*3/uL (ref 0.7–4.0)
MCH: 29.8 pg (ref 26.0–34.0)
MCHC: 33 g/dL (ref 30.0–36.0)
MCV: 90.3 fL (ref 80.0–100.0)
Monocytes Absolute: 0.3 10*3/uL (ref 0.1–1.0)
Monocytes Relative: 5 %
Neutro Abs: 3.5 10*3/uL (ref 1.7–7.7)
Neutrophils Relative %: 65 %
Platelets: 288 10*3/uL (ref 150–400)
RBC: 4.84 MIL/uL (ref 3.87–5.11)
RDW: 12.7 % (ref 11.5–15.5)
WBC: 5.4 10*3/uL (ref 4.0–10.5)
nRBC: 0 % (ref 0.0–0.2)

## 2020-01-22 LAB — URINE DRUG SCREEN, QUALITATIVE (ARMC ONLY)
Amphetamines, Ur Screen: POSITIVE — AB
Barbiturates, Ur Screen: NOT DETECTED
Benzodiazepine, Ur Scrn: NOT DETECTED
Cannabinoid 50 Ng, Ur ~~LOC~~: POSITIVE — AB
Cocaine Metabolite,Ur ~~LOC~~: NOT DETECTED
MDMA (Ecstasy)Ur Screen: NOT DETECTED
Methadone Scn, Ur: NOT DETECTED
Opiate, Ur Screen: NOT DETECTED
Phencyclidine (PCP) Ur S: NOT DETECTED
Tricyclic, Ur Screen: NOT DETECTED

## 2020-01-22 LAB — URINALYSIS, COMPLETE (UACMP) WITH MICROSCOPIC
Glucose, UA: NEGATIVE mg/dL
Hgb urine dipstick: NEGATIVE
Ketones, ur: NEGATIVE mg/dL
Nitrite: NEGATIVE
Protein, ur: 30 mg/dL — AB
Specific Gravity, Urine: 1.03 (ref 1.005–1.030)
pH: 5 (ref 5.0–8.0)

## 2020-01-22 LAB — COMPREHENSIVE METABOLIC PANEL
ALT: 15 U/L (ref 0–44)
AST: 19 U/L (ref 15–41)
Albumin: 5 g/dL (ref 3.5–5.0)
Alkaline Phosphatase: 55 U/L (ref 38–126)
Anion gap: 8 (ref 5–15)
BUN: 9 mg/dL (ref 6–20)
CO2: 27 mmol/L (ref 22–32)
Calcium: 10 mg/dL (ref 8.9–10.3)
Chloride: 104 mmol/L (ref 98–111)
Creatinine, Ser: 0.8 mg/dL (ref 0.44–1.00)
GFR calc Af Amer: >60 mL/min (ref >60)
GFR calc non Af Amer: >60 mL/min (ref >60)
Glucose, Bld: 94 mg/dL (ref 70–99)
Potassium: 3.8 mmol/L (ref 3.5–5.1)
Sodium: 139 mmol/L (ref 135–145)
Total Bilirubin: 0.8 mg/dL (ref 0.3–1.2)
Total Protein: 8.6 g/dL — ABNORMAL HIGH (ref 6.5–8.1)

## 2020-01-22 LAB — ETHANOL: Alcohol, Ethyl (B): <10 mg/dL (ref <10)

## 2020-01-22 LAB — POCT PREGNANCY, URINE: Preg Test, Ur: NEGATIVE

## 2020-01-22 LAB — ACETAMINOPHEN LEVEL: Acetaminophen (Tylenol), Serum: <10 ug/mL — ABNORMAL LOW (ref 10–30)

## 2020-01-22 NOTE — ED Notes (Signed)
EKG obtained per MD verbal order.

## 2020-01-22 NOTE — ED Triage Notes (Addendum)
Arrives via [facility].  Mom states patient was unresponsive in bed this morning.  On EMS arrival, patient was difficult to wake, required sternal rub.  Patient arrives AAOx3.  Skin warm and dry.  Admits to using Marijuana this morning.  VSS.  CBG: 94.  Patient states she is feeling better but just "feeling tired" for a long time.  Patient is tearful in triage.  Denies SI/HI.  Denies feeling depressed.

## 2020-01-22 NOTE — ED Provider Notes (Signed)
[facility] Emergency Department Provider Note   ____________________________________________   First MD Initiated Contact with Patient 01/22/20 1333     (approximate)  I have reviewed the triage vital signs and the nursing notes.   HISTORY  Chief Complaint Headache    HPI Jill Osborne is a 19 y.o. female with past medical history of asthma and depression who presents to the ED for altered mental status.  Mom states that she returned home this morning to find patient asleep in bed but very difficult to arouse.  Mom tried for 10 minutes to wake the patient up by shaking her, but she did not arouse and required sternal rub at the time of EMS arrival.  Patient states that she remembers all of this happening but was distressed because she felt like she could not move.  Once she woke up with EMS, she appeared alert and oriented, now seems to be back to her baseline mental status per mom.  Patient states that she has been "feeling tired" for at least the past month with poor appetite, unintentional weight loss, and difficulty sleeping.  She denies depressed mood, but is tearful here in the ED.  She admits to regular marijuana use, including last night before she went to bed, but denies any other drug use or alcohol abuse.  She has never had a similar episode in the past, denies any history of seizures.  She did not have any tongue biting, but does state that she urinated on herself last night.        Past Medical History:  Diagnosis Date  . Asthma   . Depression   . Lumbar herniated disc   . Tetanus     Patient Active Problem List   Diagnosis Date Noted  . Weight loss, abnormal 01/13/2020  . Fatigue 01/13/2020  . MDD (major depressive disorder), recurrent severe, without psychosis ([place]) 07/23/2019    Past Surgical History:  Procedure Laterality Date  . LAPAROSCOPIC APPENDECTOMY N/A 07/15/2016   Procedure: APPENDECTOMY LAPAROSCOPIC;  Surgeon: Clayburn Pert, MD;  Location: [facility] ORS;  Service: General;  Laterality: N/A;    Prior to Admission medications   Medication Sig Start Date End Date Taking? Authorizing Provider  albuterol (PROVENTIL) (2.5 MG/3ML) 0.083% nebulizer solution Take 3 mLs (2.5 mg total) by nebulization every 6 (six) hours as needed for wheezing or shortness of breath. 10/22/19   Triplett, Cari B, FNP  albuterol (VENTOLIN HFA) 108 (90 Base) MCG/ACT inhaler Inhale 2 puffs into the lungs every 4 (four) hours as needed for wheezing or shortness of breath. 10/22/19   Triplett, Cari B, FNP  predniSONE (DELTASONE) 10 MG tablet Take 5 tablets (50 mg total) by mouth daily. 10/22/19   Triplett, Dessa Phi, FNP  venlafaxine XR (EFFEXOR-XR) 75 MG 24 hr capsule Take 1 capsule (75 mg total) by mouth daily with breakfast. 10/28/19   Mozingo, Berdie Ogren, NP    Allergies Shellfish allergy and Eggs or egg-derived products  Family History  Problem Relation Age of Onset  . Healthy Mother   . Healthy Father     Social History Social History   Tobacco Use  . Smoking status: Never Smoker  . Smokeless tobacco: Never Used  Substance Use Topics  . Alcohol use: No  . Drug use: Yes    Types: Marijuana    Review of Systems  Constitutional: No fever/chills.  Positive for unresponsive episode.  Positive for generalized fatigue. Eyes: No visual changes. ENT: No sore  throat. Cardiovascular: Denies chest pain. Respiratory: Denies shortness of breath. Gastrointestinal: No abdominal pain.  No nausea, no vomiting.  No diarrhea.  No constipation. Genitourinary: Negative for dysuria. Musculoskeletal: Negative for back pain. Skin: Negative for rash. Neurological: Negative for headaches, focal weakness or numbness.  ____________________________________________   PHYSICAL EXAM:  VITAL SIGNS: ED Triage Vitals  Enc Vitals Group     BP 01/22/20 1226 111/76     Pulse Rate 01/22/20 1226 93     Resp 01/22/20 1226 14     Temp 01/22/20 1226  98.4 F (36.9 C)     Temp Source 01/22/20 1226 Oral     SpO2 01/22/20 1226 100 %     Weight 01/22/20 1222 126 lb (57.2 kg)     Height 01/22/20 1222 5' (1.524 m)     Head Circumference --      Peak Flow --      Pain Score 01/22/20 1324 0     Pain Loc --      Pain Edu? --      Excl. in GC? --     Constitutional: Alert and oriented to person, place, time, and situation. Eyes: Conjunctivae are normal.  Pupils equal round and reactive to light bilaterally, extraocular movements intact. Head: Atraumatic. Nose: No congestion/rhinnorhea. Mouth/Throat: Mucous membranes are moist. Neck: Normal ROM Cardiovascular: Normal rate, regular rhythm. Grossly normal heart sounds. Respiratory: Normal respiratory effort.  No retractions. Lungs CTAB. Gastrointestinal: Soft and nontender. No distention. Genitourinary: deferred Musculoskeletal: No lower extremity tenderness nor edema. Neurologic:  Normal speech and language. No gross focal neurologic deficits are appreciated. Skin:  Skin is warm, dry and intact. No rash noted. Psychiatric: Mood and affect are normal. Speech and behavior are normal.  ____________________________________________   LABS (all labs ordered are listed, but only abnormal results are displayed)  Labs Reviewed  COMPREHENSIVE METABOLIC PANEL - Abnormal; Notable for the following components:      Result Value   Total Protein 8.6 (*)    All other components within normal limits  URINE DRUG SCREEN, QUALITATIVE ([facility] ONLY) - Abnormal; Notable for the following components:   Amphetamines, Ur Screen POSITIVE (*)    Cannabinoid 50 Ng, Ur [place] POSITIVE (*)    All other components within normal limits  URINALYSIS, COMPLETE (UACMP) WITH MICROSCOPIC - Abnormal; Notable for the following components:   Color, Urine YELLOW (*)    APPearance HAZY (*)    Bilirubin Urine MODERATE (*)    Protein, ur 30 (*)    Leukocytes,Ua TRACE (*)    Bacteria, UA RARE (*)    All other components within  normal limits  SALICYLATE LEVEL - Abnormal; Notable for the following components:   Salicylate Lvl <7.0 (*)    All other components within normal limits  ACETAMINOPHEN LEVEL - Abnormal; Notable for the following components:   Acetaminophen (Tylenol), Serum <10 (*)    All other components within normal limits  CBC WITH DIFFERENTIAL/PLATELET  ETHANOL  POC URINE PREG, ED  POCT PREGNANCY, URINE   ____________________________________________  EKG  ED ECG REPORT I, Chesley Noon, the attending physician, personally viewed and interpreted this ECG.   Date: 01/22/2020  EKG Time: 14:11  Rate: 78  Rhythm: normal sinus rhythm  Axis: Normal  Intervals:none  ST&T Change: None   PROCEDURES  Procedure(s) performed (including Critical Care):  Procedures   ____________________________________________   INITIAL IMPRESSION / ASSESSMENT AND PLAN / ED COURSE       19 year old female with history of  asthma depression presents to the ED after she had an unresponsive episode this morning at home, required sternal rub by EMS to arouse.  She now seems to be back to her baseline mental status with no focal neurologic deficits.  She does complain of worsening generalized fatigue over about the past month along with poor appetite, unintentional weight loss, and difficulty sleeping.  It is possible that depression is playing a role in her fatigue, but she denies any depressed mood.  She was recently evaluated in the ED for similar complaints of fatigue, when CT head was negative for acute process.  I do not feel repeat CT imaging is indicated today given she is back to her baseline with no focal deficits.  Lab work is unremarkable, urine pregnancy negative.  UA shows no evidence of infection, UDS positive for cannabinoids and amphetamines.  Patient denies any amphetamine use and it seems unlikely that amphetamines would be contributing to her episode.  EKG is unremarkable and I doubt cardiac etiology  for this episode.  Seizure would be possible explanation, however it would be unusual given patient states she remembers the entirety of the episode.  At this time, she is appropriate for discharge home, but I feel she would benefit from follow-up with both neurology and psychiatry.  She has an established psychiatrist she follows with and we will provide referral to neurology.  Patient and mother agree with plan.      ____________________________________________   FINAL CLINICAL IMPRESSION(S) / ED DIAGNOSES  Final diagnoses:  Transient alteration of awareness  Marijuana use  MDD (major depressive disorder), recurrent severe, without psychosis [facility])     ED Discharge Orders    None       Note:  This document was prepared using Dragon voice recognition software and may include unintentional dictation errors.   Chesley Noon, MD 01/22/20 [phone]

## 2020-01-22 NOTE — ED Notes (Signed)
See triage note. Pt stating she overall feels tired. Pt stating eating an edible last night but denies any other substance use. Mom at bedside.

## 2020-01-29 ENCOUNTER — Ambulatory Visit: Payer: BC Managed Care – PPO | Admitting: Primary Care

## 2020-01-29 ENCOUNTER — Encounter: Payer: Self-pay | Admitting: Primary Care

## 2020-01-29 ENCOUNTER — Other Ambulatory Visit: Payer: Self-pay

## 2020-01-29 VITALS — BP 116/80 | HR 82 | Temp 97.4°F | Ht 60.0 in | Wt 131.2 lb

## 2020-01-29 DIAGNOSIS — F332 Major depressive disorder, recurrent severe without psychotic features: Secondary | ICD-10-CM | POA: Diagnosis not present

## 2020-01-29 MED ORDER — VENLAFAXINE HCL ER 37.5 MG PO CP24: 37.5000 mg | ORAL_CAPSULE | Freq: Every day | ORAL | 0 refills | Status: DC

## 2020-01-29 NOTE — Progress Notes (Signed)
Subjective:    Patient ID: Jill Osborne, female    DOB: [birth year], 19 y.o.   MRN: [id number]  HPI  This visit occurred during the SARS-CoV-2 public health emergency.  Safety protocols were in place, including screening questions prior to the visit, additional usage of staff PPE, and extensive cleaning of exam room while observing appropriate contact time as indicated for disinfecting solutions.   Jill Osborne is a 19 year old female with a history of MDD, fatigue who presents today with her mother to discuss depression.   She was last evaluated on 01/13/20 for symptoms of fatigue, decreased appetite, increased sleep. She also endorsed working late shift hours at [employer], weight loss of nearly 30 pounds since October 2020. She had been on venlafaxine since October 2020, mom believed symptoms to be secondary to venlafaxine.  She is following with psychiatry so we encouraged her to connect with her psychiatrist regarding concerns of venlafaxine and symptoms.  Since her last visit she has not been in to see her psychiatrist, she does have an appointment scheduled for April 28th. Since her last visit her depression has progressed. Symptoms of hearing voices in her mind which are "calling me a liar" and feeding other negative thoughts. Also feeling sad/tired/down/lost/alone. She got lost on her way to a well known restaurant. Also with short term memory issues.   She presented to [facility] ED via EMS on 01/22/20 due to an unresponsive episode at home. Paramedics could not arouse patient for quite some time, had to use a bag valve mask, no CPR. She did note urinary incontinence, no bites on her tongue. Work up in the ED overall negative including labs and ECG. CT head last month unremarkable. Patient was referred back to her psychiatrist and also to establish care with neurology. She was discharged home later that day.   Her mom endorses a chronic history of depression over the years, spiked in 27-Feb-2016 after a  friend's death, recovered after therapy. Her mother believes that her recent symptoms began Summer 2020 after being in an unhealthy relation ship with her prior boyfriend. She lived with her boyfriend at the time, was very isolated and didn't socialize much with family. She has never been on any other medication other than venlafaxine, continues to take ER 75 mg.   Review of Systems  Eyes: Negative for visual disturbance.  Genitourinary:       Urinary incontinence  Neurological: Negative for seizures.  Psychiatric/Behavioral:       See HPI       Past Medical History:  Diagnosis Date  . Asthma   . Depression   . Lumbar herniated disc   . Tetanus      Social History   Socioeconomic History  . Marital status: Single    Spouse name: Not on file  . Number of children: Not on file  . Years of education: Not on file  . Highest education level: Not on file  Occupational History  . Not on file  Tobacco Use  . Smoking status: Never Smoker  . Smokeless tobacco: Never Used  Substance and Sexual Activity  . Alcohol use: No  . Drug use: Yes    Types: Marijuana  . Sexual activity: Yes    Partners: Male    Birth control/protection: None, Condom  Other Topics Concern  . Not on file  Social History Narrative   Single.   Student at [employer], [employer].   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  Enjoys reading, spending time with friends.   Social Determinants of Health   Financial Resource Strain:   . Difficulty of Paying Living Expenses:   Food Insecurity:   . Worried About Programme researcher, broadcasting/film/video in the Last Year:   . Barista in the Last Year:   Transportation Needs:   . Freight forwarder (Medical):   Marland Kitchen Lack of Transportation (Non-Medical):   Physical Activity:   . Days of Exercise per Week:   . Minutes of Exercise per Session:   Stress:   . Feeling of Stress :   Social Connections:   . Frequency of Communication with Friends and Family:   . Frequency of Social  Gatherings with Friends and Family:   . Attends Religious Services:   . Active Member of Clubs or Organizations:   . Attends Banker Meetings:   Marland Kitchen Marital Status:   Intimate Partner Violence:   . Fear of Current or Ex-Partner:   . Emotionally Abused:   Marland Kitchen Physically Abused:   . Sexually Abused:     Past Surgical History:  Procedure Laterality Date  . LAPAROSCOPIC APPENDECTOMY N/A 07/15/2016   Procedure: APPENDECTOMY LAPAROSCOPIC;  Surgeon: Ricarda Frame, MD;  Location: [facility] ORS;  Service: General;  Laterality: N/A;    Family History  Problem Relation Age of Onset  . Healthy Mother   . Healthy Father     Allergies  Allergen Reactions  . Shellfish Allergy Hives  . Eggs Or Egg-Derived Products Nausea And Vomiting    Current Outpatient Medications on File Prior to Visit  Medication Sig Dispense Refill  . albuterol (PROVENTIL) (2.5 MG/3ML) 0.083% nebulizer solution Take 3 mLs (2.5 mg total) by nebulization every 6 (six) hours as needed for wheezing or shortness of breath. 75 mL 1  . albuterol (VENTOLIN HFA) 108 (90 Base) MCG/ACT inhaler Inhale 2 puffs into the lungs every 4 (four) hours as needed for wheezing or shortness of breath. 8 g 1  . predniSONE (DELTASONE) 10 MG tablet Take 5 tablets (50 mg total) by mouth daily. 25 tablet 0   Current Facility-Administered Medications on File Prior to Visit  Medication Dose Route Frequency Provider Last Rate Last Admin  . Levonorgestrel IUD 19.5 mg  1 each Intrauterine Continuous Reva Bores, MD   19.5 mg at 04/26/18 1030    BP 116/80   Pulse 82   Temp (!) 97.4 F (36.3 C) (Temporal)   Ht 5' (1.524 m)   Wt 131 lb 4 oz (59.5 kg)   SpO2 98%   BMI 25.63 kg/m    Objective:   Physical Exam  Constitutional: She is oriented to person, place, and time. She appears well-nourished.  Respiratory: Effort normal.  Neurological: She is alert and oriented to person, place, and time.  Psychiatric:  Poor eye contact,  tearful at times, cooperative            Assessment & Plan:

## 2020-01-29 NOTE — Assessment & Plan Note (Signed)
Progressing, recent evaluation at [facility] ED for unresponsiveness with inconclusive work up.  Agree that her depression is uncontrolled and venlafaxine isn't effective. She has an appointment with her psychiatry provider in 2 weeks, for now we will work on weaning her off of venlafaxine.   Rx for venlafaxine ER 37.5 sent to pharmacy. We will plan to see her back in one week for follow up. If she's improving then continue weaning process.   Agree to neurology evaluation.

## 2020-01-29 NOTE — Patient Instructions (Signed)
We are reducing your venlafaxine to 37.5 mg. Start this new dose immediately.   Schedule a follow up visit with me for 1 week.   It was a pleasure to see you today!

## 2020-02-06 DIAGNOSIS — R410 Disorientation, unspecified: Secondary | ICD-10-CM | POA: Diagnosis not present

## 2020-02-06 DIAGNOSIS — Z8659 Personal history of other mental and behavioral disorders: Secondary | ICD-10-CM | POA: Diagnosis not present

## 2020-02-06 DIAGNOSIS — R413 Other amnesia: Secondary | ICD-10-CM | POA: Diagnosis not present

## 2020-02-06 DIAGNOSIS — R4189 Other symptoms and signs involving cognitive functions and awareness: Secondary | ICD-10-CM | POA: Diagnosis not present

## 2020-02-09 ENCOUNTER — Ambulatory Visit: Payer: BC Managed Care – PPO | Attending: Internal Medicine

## 2020-02-09 DIAGNOSIS — Z23 Encounter for immunization: Secondary | ICD-10-CM

## 2020-02-09 NOTE — Progress Notes (Signed)
   Covid-19 Vaccination Clinic  Name:  Jill Osborne    MRN: [id number] DOB: 09-29-2001  02/09/2020  [name] was observed post Covid-19 immunization for 15 minutes without incident. She was provided with Vaccine Information Sheet and instruction to access the V-Safe system.   [name] was instructed to call 911 with any severe reactions post vaccine: Marland Kitchen Difficulty breathing  . Swelling of face and throat  . A fast heartbeat  . A bad rash all over body  . Dizziness and weakness   Immunizations Administered    Name Date Dose VIS Date Route   Pfizer COVID-19 Vaccine 02/09/2020  9:35 AM 0.3 mL 12/11/2018 Intramuscular   Manufacturer: [employer], [employer]   Lot: [id number]   NDC: [address]-9

## 2020-02-10 ENCOUNTER — Ambulatory Visit: Payer: BC Managed Care – PPO | Admitting: Primary Care

## 2020-02-10 ENCOUNTER — Other Ambulatory Visit: Payer: Self-pay

## 2020-02-10 DIAGNOSIS — R4189 Other symptoms and signs involving cognitive functions and awareness: Secondary | ICD-10-CM | POA: Insufficient documentation

## 2020-02-10 DIAGNOSIS — R569 Unspecified convulsions: Secondary | ICD-10-CM | POA: Insufficient documentation

## 2020-02-10 DIAGNOSIS — R413 Other amnesia: Secondary | ICD-10-CM | POA: Insufficient documentation

## 2020-02-10 DIAGNOSIS — Z8659 Personal history of other mental and behavioral disorders: Secondary | ICD-10-CM | POA: Insufficient documentation

## 2020-02-10 DIAGNOSIS — R404 Transient alteration of awareness: Secondary | ICD-10-CM

## 2020-02-10 DIAGNOSIS — F332 Major depressive disorder, recurrent severe without psychotic features: Secondary | ICD-10-CM

## 2020-02-10 DIAGNOSIS — R410 Disorientation, unspecified: Secondary | ICD-10-CM

## 2020-02-10 HISTORY — DX: Disorientation, unspecified: R41.0

## 2020-02-10 HISTORY — DX: Unspecified convulsions: R56.9

## 2020-02-10 HISTORY — DX: Transient alteration of awareness: R40.4

## 2020-02-10 HISTORY — DX: Other amnesia: R41.3

## 2020-02-10 NOTE — Progress Notes (Signed)
Subjective:    Patient ID: Jill Osborne, female    DOB: [birth year], 19 y.o.   MRN: [id number]  HPI  This visit occurred during the SARS-CoV-2 public health emergency.  Safety protocols were in place, including screening questions prior to the visit, additional usage of staff PPE, and extensive cleaning of exam room while observing appropriate contact time as indicated for disinfecting solutions.   Ms. Thalmann is a 19 year old female with a history of fatigue, MDD, abnormal weight loss who presents today for follow up of MDD.  She was last evaluated one week ago for ED follow up for unresponsiveness. Mother and patient endorsed depression symptoms progressing without improvement despite venlafaxine, also with memory issues, forgetfulness, continued weight loss, potential seizure like activity. Given little improvement with depression symptoms coupled with weight loss we reduced her dose of venlafaxine to 37.5 in an attempt to wean off.   Since her last visit she's feeling better. Mood and memory have improved, but she's still not eating much. She was evaluated by neurology last week and she will undergo routine and extended EEG for evaluation of seizure disorder.   Her mother agrees that her mood has improved and is overall less foggy. She has had no problem weaning down from venlafaxine 75 to 37.5 mg. She has an appointment with her psychiatrist in two days.   Wt Readings from Last 3 Encounters:  02/10/20 127 lb 12 oz (57.9 kg)  01/29/20 131 lb 4 oz (59.5 kg)  01/22/20 126 lb (57.2 kg)     Review of Systems  Constitutional: Positive for appetite change.  Neurological: Negative for seizures.  Psychiatric/Behavioral:       Improving, see HPI       Past Medical History:  Diagnosis Date  . Asthma   . Depression   . Lumbar herniated disc   . Tetanus      Social History   Socioeconomic History  . Marital status: Single    Spouse name: Not on file  . Number of children: Not  on file  . Years of education: Not on file  . Highest education level: Not on file  Occupational History  . Not on file  Tobacco Use  . Smoking status: Never Smoker  . Smokeless tobacco: Never Used  Substance and Sexual Activity  . Alcohol use: No  . Drug use: Yes    Types: Marijuana  . Sexual activity: Yes    Partners: Male    Birth control/protection: None, Condom  Other Topics Concern  . Not on file  Social History Narrative   Single.   Student at [employer], [employer].   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   Enjoys reading, spending time with friends.   Social Determinants of Health   Financial Resource Strain:   . Difficulty of Paying Living Expenses:   Food Insecurity:   . Worried About Charity fundraiser in the Last Year:   . Arboriculturist in the Last Year:   Transportation Needs:   . Film/video editor (Medical):   Marland Kitchen Lack of Transportation (Non-Medical):   Physical Activity:   . Days of Exercise per Week:   . Minutes of Exercise per Session:   Stress:   . Feeling of Stress :   Social Connections:   . Frequency of Communication with Friends and Family:   . Frequency of Social Gatherings with Friends and Family:   . Attends Religious Services:   . Active Member  of Clubs or Organizations:   . Attends Banker Meetings:   Marland Kitchen Marital Status:   Intimate Partner Violence:   . Fear of Current or Ex-Partner:   . Emotionally Abused:   Marland Kitchen Physically Abused:   . Sexually Abused:     Past Surgical History:  Procedure Laterality Date  . LAPAROSCOPIC APPENDECTOMY N/A 07/15/2016   Procedure: APPENDECTOMY LAPAROSCOPIC;  Surgeon: Ricarda Frame, MD;  Location: [facility] ORS;  Service: General;  Laterality: N/A;    Family History  Problem Relation Age of Onset  . Healthy Mother   . Healthy Father     Allergies  Allergen Reactions  . Shellfish Allergy Hives  . Eggs Or Egg-Derived Products Nausea And Vomiting    Current Outpatient Medications on File Prior  to Visit  Medication Sig Dispense Refill  . albuterol (PROVENTIL) (2.5 MG/3ML) 0.083% nebulizer solution Take 3 mLs (2.5 mg total) by nebulization every 6 (six) hours as needed for wheezing or shortness of breath. 75 mL 1  . albuterol (VENTOLIN HFA) 108 (90 Base) MCG/ACT inhaler Inhale 2 puffs into the lungs every 4 (four) hours as needed for wheezing or shortness of breath. 8 g 1  . venlafaxine XR (EFFEXOR XR) 37.5 MG 24 hr capsule Take 1 capsule (37.5 mg total) by mouth daily with breakfast. 30 capsule 0   Current Facility-Administered Medications on File Prior to Visit  Medication Dose Route Frequency Provider Last Rate Last Admin  . Levonorgestrel IUD 19.5 mg  1 each Intrauterine Continuous Reva Bores, MD   19.5 mg at 04/26/18 1030    BP 104/64   Pulse 82   Temp (!) 96.5 F (35.8 C) (Temporal)   Ht 5' (1.524 m)   Wt 127 lb 12 oz (57.9 kg)   SpO2 98%   BMI 24.95 kg/m    Objective:   Physical Exam  Constitutional: She appears well-nourished.  Cardiovascular: Normal rate and regular rhythm.  Respiratory: Effort normal and breath sounds normal.  Musculoskeletal:     Cervical back: Neck supple.  Skin: Skin is warm and dry.  Psychiatric: She has a normal mood and affect.  Improved. Better eye contact, more conversational.            Assessment & Plan:

## 2020-02-10 NOTE — Patient Instructions (Signed)
Follow up with neurology and psychiatry as scheduled.  Please schedule a follow up appointment in 1 month.  It was a pleasure to see you today!

## 2020-02-10 NOTE — Assessment & Plan Note (Signed)
Following with neurology, will undergo EEG and MR brain. No diagnosis as of yet.

## 2020-02-10 NOTE — Assessment & Plan Note (Signed)
Improving and appears better today. Continue to wean down on venlafaxine with goal of weaning off. She will follow up with her psychiatrist later this week.

## 2020-02-12 ENCOUNTER — Ambulatory Visit (INDEPENDENT_AMBULATORY_CARE_PROVIDER_SITE_OTHER): Payer: BC Managed Care – PPO | Admitting: Adult Health

## 2020-02-12 ENCOUNTER — Other Ambulatory Visit: Payer: Self-pay

## 2020-02-12 ENCOUNTER — Encounter: Payer: Self-pay | Admitting: Adult Health

## 2020-02-12 DIAGNOSIS — F411 Generalized anxiety disorder: Secondary | ICD-10-CM

## 2020-02-12 DIAGNOSIS — F331 Major depressive disorder, recurrent, moderate: Secondary | ICD-10-CM | POA: Diagnosis not present

## 2020-02-12 DIAGNOSIS — G4700 Insomnia, unspecified: Secondary | ICD-10-CM | POA: Diagnosis not present

## 2020-02-12 DIAGNOSIS — F4310 Post-traumatic stress disorder, unspecified: Secondary | ICD-10-CM | POA: Diagnosis not present

## 2020-02-12 NOTE — Progress Notes (Signed)
Jill Osborne [id number] June 09, 2001 19 y.o.  Subjective:   Patient ID:  Jill Osborne is a 19 y.o. (DOB [birth year]) female.  Chief Complaint: No chief complaint on file.   HPI Jill Osborne presents to the office today for follow-up of MDD, GAD, PTSD, and insomnia.   Accompanied by mother - concerned about her medical issues. PCP reduced Effexor from 75mg  to 37.5mg  daily x 1 week ago. Concerns she may be having seizures. Getting lost, forgetful, forgetting events.   Describes mood today as "ok". Flat. Denies tearfulness. Mood symptoms - denies depression and anxiety. Irritable at times. Stating "I just want to be left alone". Family "bothering" me. Stating "I do what I like to do, and they think I'm wrong". Stating the medication is poisoning my brain. It makes me too happy and I want to be "normal". Is not taking classes. Started a job at and quit a month ago. Plans to get a job when she feels "better". Stating "I cry a lot, but I'm not sad". Denies self harm. Stating "I'm just bored". Patient does not want to take any further medications.  Stating "my family is worried about me, but I want to be left alone". Plans to see a new therapist. Decreased interest and motivation. Taking medications as prescribed.  Energy levels low. Active, does not have a regular exercise routine.  Does not enjoy usual interests and activities. Does not find listening to music relaxing anymore. Single. Lives with parents and brother 110 and sister 46. Getting out some. Stating "I feel like they force me to go out". Appetite decreased. Weight loss 60 pounds since last October.  Sleeping difficulties. Having a "hard time" falling asleep "completely". Doesn't feel "comfortable enough" to go to sleep. Stating "I can't fully relax at home". Not wanting to sleep during the night.  Focus and concentration "slower". Forgetful. Completing tasks. Managing aspects of household. Distracted.  Denies SI or HI.  Positive for AH or VH. Stating "I notice more". Stating "I'm good at guessing what is right". Also stating "I'm good at knowing". Sees "patterns - deja vu".   Review of Systems:  Review of Systems  Musculoskeletal: Negative for gait problem.  Neurological: Negative for tremors.  Psychiatric/Behavioral:       Please refer to HPI    Medications: I have reviewed the patient's current medications.  Current Outpatient Medications  Medication Sig Dispense Refill  . albuterol (PROVENTIL) (2.5 MG/3ML) 0.083% nebulizer solution Take 3 mLs (2.5 mg total) by nebulization every 6 (six) hours as needed for wheezing or shortness of breath. 75 mL 1  . albuterol (VENTOLIN HFA) 108 (90 Base) MCG/ACT inhaler Inhale 2 puffs into the lungs every 4 (four) hours as needed for wheezing or shortness of breath. 8 g 1  . venlafaxine XR (EFFEXOR XR) 37.5 MG 24 hr capsule Take 1 capsule (37.5 mg total) by mouth daily with breakfast. 30 capsule 0   Current Facility-Administered Medications  Medication Dose Route Frequency Provider Last Rate Last Admin  . Levonorgestrel IUD 19.5 mg  1 each Intrauterine Continuous November, MD   19.5 mg at 04/26/18 1030    Medication Side Effects: None  Allergies:  Allergies  Allergen Reactions  . Shellfish Allergy Hives  . Eggs Or Egg-Derived Products Nausea And Vomiting    Past Medical History:  Diagnosis Date  . Asthma   . Depression   . Lumbar herniated disc   . Tetanus     Family  History  Problem Relation Age of Onset  . Healthy Mother   . Healthy Father     Social History   Socioeconomic History  . Marital status: Single    Spouse name: Not on file  . Number of children: Not on file  . Years of education: Not on file  . Highest education level: Not on file  Occupational History  . Not on file  Tobacco Use  . Smoking status: Never Smoker  . Smokeless tobacco: Never Used  Substance and Sexual Activity  . Alcohol use: No  . Drug use: Yes     Types: Marijuana  . Sexual activity: Yes    Partners: Male    Birth control/protection: None, Condom  Other Topics Concern  . Not on file  Social History Narrative   Single.   Student at [employer], [employer].   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   Enjoys reading, spending time with friends.   Social Determinants of Health   Financial Resource Strain:   . Difficulty of Paying Living Expenses:   Food Insecurity:   . Worried About Charity fundraiser in the Last Year:   . Arboriculturist in the Last Year:   Transportation Needs:   . Film/video editor (Medical):   Marland Kitchen Lack of Transportation (Non-Medical):   Physical Activity:   . Days of Exercise per Week:   . Minutes of Exercise per Session:   Stress:   . Feeling of Stress :   Social Connections:   . Frequency of Communication with Friends and Family:   . Frequency of Social Gatherings with Friends and Family:   . Attends Religious Services:   . Active Member of Clubs or Organizations:   . Attends Archivist Meetings:   Marland Kitchen Marital Status:   Intimate Partner Violence:   . Fear of Current or Ex-Partner:   . Emotionally Abused:   Marland Kitchen Physically Abused:   . Sexually Abused:     Past Medical History, Surgical history, Social history, and Family history were reviewed and updated as appropriate.   Please see review of systems for further details on the patient's review from today.   Objective:   Physical Exam:  There were no vitals taken for this visit.  Physical Exam Constitutional:      General: She is not in acute distress. Musculoskeletal:        General: No deformity.  Neurological:     Mental Status: She is alert and oriented to person, place, and time.     Coordination: Coordination normal.  Psychiatric:        Attention and Perception: Attention and perception normal. She does not perceive auditory or visual hallucinations.        Mood and Affect: Mood normal. Mood is not anxious or depressed. Affect is not  labile, blunt, angry or inappropriate.        Speech: Speech normal.        Behavior: Behavior normal.        Thought Content: Thought content normal. Thought content is not paranoid or delusional. Thought content does not include homicidal or suicidal ideation. Thought content does not include homicidal or suicidal plan.        Cognition and Memory: Cognition and memory normal.        Judgment: Judgment normal.     Comments: Insight intact     Lab Review:     Component Value Date/Time   NA 139 01/22/2020 1228  K 3.8 01/22/2020 1228   CL 104 01/22/2020 1228   CO2 27 01/22/2020 1228   GLUCOSE 94 01/22/2020 1228   BUN 9 01/22/2020 1228   CREATININE 0.80 01/22/2020 1228   CALCIUM 10.0 01/22/2020 1228   PROT 8.6 (H) 01/22/2020 1228   ALBUMIN 5.0 01/22/2020 1228   AST 19 01/22/2020 1228   ALT 15 01/22/2020 1228   ALKPHOS 55 01/22/2020 1228   BILITOT 0.8 01/22/2020 1228   GFRNONAA >60 01/22/2020 1228   GFRAA >60 01/22/2020 1228       Component Value Date/Time   WBC 5.4 01/22/2020 1228   RBC 4.84 01/22/2020 1228   HGB 14.4 01/22/2020 1228   HCT 43.7 01/22/2020 1228   PLT 288 01/22/2020 1228   MCV 90.3 01/22/2020 1228   MCH 29.8 01/22/2020 1228   MCHC 33.0 01/22/2020 1228   RDW 12.7 01/22/2020 1228   LYMPHSABS 1.5 01/22/2020 1228   MONOABS 0.3 01/22/2020 1228   EOSABS 0.1 01/22/2020 1228   BASOSABS 0.1 01/22/2020 1228    No results found for: POCLITH, LITHIUM   No results found for: PHENYTOIN, PHENOBARB, VALPROATE, CBMZ   .res Assessment: Plan:    Plan:  1. Effexor XR 37.5mg  daily - tapering off - has 7 more days  Continue therapy  RTC 4 weeks  Patient advised to contact office with any questions, adverse effects, or acute worsening in signs and symptoms.   Diagnoses and all orders for this visit:  PTSD (post-traumatic stress disorder)  Generalized anxiety disorder  Insomnia, unspecified type  Major depressive disorder, recurrent episode, moderate  (HCC)     Please see After Visit Summary for patient specific instructions.  Future Appointments  Date Time Provider Department Center  03/11/2020  3:20 PM Doreene Nest, NP [facility]  03/23/2020  1:00 PM Evon Slack [facility] None    No orders of the defined types were placed in this encounter.   -------------------------------

## 2020-02-13 DIAGNOSIS — R569 Unspecified convulsions: Secondary | ICD-10-CM | POA: Diagnosis not present

## 2020-02-19 DIAGNOSIS — R569 Unspecified convulsions: Secondary | ICD-10-CM | POA: Diagnosis not present

## 2020-02-21 ENCOUNTER — Other Ambulatory Visit: Payer: Self-pay | Admitting: Primary Care

## 2020-02-21 DIAGNOSIS — F332 Major depressive disorder, recurrent severe without psychotic features: Secondary | ICD-10-CM

## 2020-03-06 DIAGNOSIS — Z008 Encounter for other general examination: Secondary | ICD-10-CM | POA: Diagnosis not present

## 2020-03-06 DIAGNOSIS — Z20822 Contact with and (suspected) exposure to covid-19: Secondary | ICD-10-CM | POA: Diagnosis not present

## 2020-03-06 DIAGNOSIS — F329 Major depressive disorder, single episode, unspecified: Secondary | ICD-10-CM | POA: Diagnosis not present

## 2020-03-06 DIAGNOSIS — R461 Bizarre personal appearance: Secondary | ICD-10-CM | POA: Diagnosis not present

## 2020-03-06 DIAGNOSIS — R441 Visual hallucinations: Secondary | ICD-10-CM | POA: Diagnosis not present

## 2020-03-06 DIAGNOSIS — R440 Auditory hallucinations: Secondary | ICD-10-CM | POA: Diagnosis not present

## 2020-03-07 DIAGNOSIS — Z20822 Contact with and (suspected) exposure to covid-19: Secondary | ICD-10-CM | POA: Diagnosis not present

## 2020-03-07 DIAGNOSIS — Z008 Encounter for other general examination: Secondary | ICD-10-CM | POA: Diagnosis not present

## 2020-03-07 DIAGNOSIS — J45909 Unspecified asthma, uncomplicated: Secondary | ICD-10-CM | POA: Diagnosis not present

## 2020-03-07 DIAGNOSIS — F209 Schizophrenia, unspecified: Secondary | ICD-10-CM | POA: Diagnosis not present

## 2020-03-07 DIAGNOSIS — F329 Major depressive disorder, single episode, unspecified: Secondary | ICD-10-CM | POA: Diagnosis not present

## 2020-03-07 DIAGNOSIS — F29 Unspecified psychosis not due to a substance or known physiological condition: Secondary | ICD-10-CM | POA: Insufficient documentation

## 2020-03-07 DIAGNOSIS — F4310 Post-traumatic stress disorder, unspecified: Secondary | ICD-10-CM | POA: Diagnosis not present

## 2020-03-07 HISTORY — DX: Unspecified psychosis not due to a substance or known physiological condition: F29

## 2020-03-09 ENCOUNTER — Telehealth: Payer: Self-pay

## 2020-03-09 MED ORDER — MELATONIN 3 MG PO TABS
3.00 | ORAL_TABLET | ORAL | Status: DC
Start: ? — End: 2020-03-09

## 2020-03-09 MED ORDER — IBUPROFEN 600 MG PO TABS
600.00 | ORAL_TABLET | ORAL | Status: DC
Start: ? — End: 2020-03-09

## 2020-03-09 MED ORDER — NICOTINE 7 MG/24HR TD PT24
1.00 | MEDICATED_PATCH | TRANSDERMAL | Status: DC
Start: ? — End: 2020-03-09

## 2020-03-09 MED ORDER — ACETAMINOPHEN 325 MG PO TABS
650.00 | ORAL_TABLET | ORAL | Status: DC
Start: ? — End: 2020-03-09

## 2020-03-09 MED ORDER — NICOTINE POLACRILEX 2 MG MT GUM
2.00 | CHEWING_GUM | OROMUCOSAL | Status: DC
Start: ? — End: 2020-03-09

## 2020-03-09 MED ORDER — DIPHENHYDRAMINE HCL 25 MG PO CAPS
25.00 | ORAL_CAPSULE | ORAL | Status: DC
Start: ? — End: 2020-03-09

## 2020-03-09 NOTE — Telephone Encounter (Signed)
Pt was in the ER on 5/21 for psych eval.

## 2020-03-09 NOTE — Telephone Encounter (Signed)
Spoken and notified patient's mother of Kate Clark's comments. Patient's mother verbalized understanding.  

## 2020-03-09 NOTE — Telephone Encounter (Signed)
Noted. I've had numerous patients seek treatment at [facility], I recommend it. I believe they take away their cell phones when admitted so she may not have had the opportunity to contact her mother. Her mother can call [facility] and ask for an update/speak with [employer].

## 2020-03-09 NOTE — Telephone Encounter (Signed)
Spoken to patient's mother and she stated that patient have been transfer to [facility]. They were unaware of this since patient is an adult. Wanted to know if Jae Dire thinks of this facility? Mom is concern since patient have not called her with an update

## 2020-03-09 NOTE — Telephone Encounter (Signed)
Yes, Jill Osborne has a good psychiatric facility.  I'm sorry to hear that she was admitted, but I'm hopeful that they can help.   Have her update [place] with medication changes, etc.

## 2020-03-09 NOTE — Telephone Encounter (Signed)
[facility] Night - Client TELEPHONE ADVICE RECORD AccessNurse Patient Name: Jill Osborne  Gender: Female DOB: March 22, 2001 Age: 19 Y 9 M 10 D Return Phone Number: [phone] (Primary), [phone] (Secondary) Address: [place] [address] Client [facility] Night - Client Client Site [facility] [place] - Night Physician Vernona Rieger - NP Contact Type Call Who Is Calling Patient / Member / Family / Caregiver Call Type Triage / Clinical Caller Name Julious Payer Relationship To Patient Mother Return Phone Number [phone] (Primary) Chief Complaint Paging or Request for Consult Reason for Call Request to Speak to a Physician Initial Comment Caller states she was wondering if she could reach Dr. Chestine Spore. States her dtr was admitted to [facility] last night and she wants advice from Dr. Chestine Spore on how to proceed. No further details were provided. Translation No Nurse Assessment Nurse: Dorann Ou, RN, [place] Date/Time (Eastern Time): 03/07/2020 2:56:38 PM Confirm and document reason for call. If symptomatic, describe symptoms. ---Caller states she was wondering if she could reach Dr. Chestine Spore. States her dtr was admitted to [facility] last night and she wants advice from Dr. Chestine Spore on how to proceed. Pt was admitted for mental health reasons. Has the patient had close contact with a person known or suspected to have the novel coronavirus illness OR traveled / lives in area with major community spread (including international travel) in the last 14 days from the onset of symptoms? * If Asymptomatic, screen for exposure and travel within the last 14 days. ---No Does the patient have any new or worsening symptoms? ---No Please document clinical information provided and list any resource used. ---Pt was admitted to [facility] for mental health problems. States that they are full for psych  and they are wanting to transfer [facility]. Parents were wanting to know if that is a good facility for the pt. Guidelines Guideline Title Affirmed Question Affirmed Notes Nurse Date/Time (Eastern Time) Disp. Time Lamount Cohen Time) Disposition Final User 03/07/2020 3:02:23 PM Clinical Call Yes Cumesty, RN, [place] Dec

## 2020-03-11 ENCOUNTER — Ambulatory Visit: Payer: BC Managed Care – PPO | Admitting: Primary Care

## 2020-03-23 ENCOUNTER — Inpatient Hospital Stay: Payer: BC Managed Care – PPO | Admitting: Primary Care

## 2020-03-23 ENCOUNTER — Ambulatory Visit (INDEPENDENT_AMBULATORY_CARE_PROVIDER_SITE_OTHER): Payer: BC Managed Care – PPO | Admitting: Psychology

## 2020-03-23 DIAGNOSIS — R569 Unspecified convulsions: Secondary | ICD-10-CM | POA: Diagnosis not present

## 2020-03-23 DIAGNOSIS — R4189 Other symptoms and signs involving cognitive functions and awareness: Secondary | ICD-10-CM | POA: Diagnosis not present

## 2020-03-23 DIAGNOSIS — R440 Auditory hallucinations: Secondary | ICD-10-CM | POA: Insufficient documentation

## 2020-03-23 DIAGNOSIS — F322 Major depressive disorder, single episode, severe without psychotic features: Secondary | ICD-10-CM

## 2020-03-23 DIAGNOSIS — Z8659 Personal history of other mental and behavioral disorders: Secondary | ICD-10-CM | POA: Diagnosis not present

## 2020-03-23 DIAGNOSIS — R413 Other amnesia: Secondary | ICD-10-CM | POA: Diagnosis not present

## 2020-03-23 HISTORY — DX: Auditory hallucinations: R44.0

## 2020-03-26 ENCOUNTER — Other Ambulatory Visit: Payer: Self-pay

## 2020-03-26 ENCOUNTER — Encounter: Payer: Self-pay | Admitting: Primary Care

## 2020-03-26 ENCOUNTER — Ambulatory Visit: Payer: BC Managed Care – PPO | Admitting: Primary Care

## 2020-03-26 DIAGNOSIS — F209 Schizophrenia, unspecified: Secondary | ICD-10-CM | POA: Diagnosis not present

## 2020-03-26 NOTE — Assessment & Plan Note (Signed)
Diagnosed at [facility] in [place] in late May 2021. Doing better on Seroquel, is compliant to the increased dose of 75 mg.   Today she appears much better, father endorses better behavior. She will follow up with therapy, psychiatry, and neurology as scheduled.   Will obtain records from [facility].

## 2020-03-26 NOTE — Progress Notes (Signed)
Subjective:    Patient ID: Jill Osborne, female    DOB: 2001-09-15, 19 y.o.   MRN: [id number]  HPI  This visit occurred during the SARS-CoV-2 public health emergency.  Safety protocols were in place, including screening questions prior to the visit, additional usage of staff PPE, and extensive cleaning of exam room while observing appropriate contact time as indicated for disinfecting solutions.   Jill Osborne is a 19 year old female with a history of MDD, transient confusion, unresponsiveness, newly diagnosed schizophrenia with psychosis who presents today for hospital follow up.  She presented to the ED at [facility] with her mother on 03/06/20 for evaluation of bizarre behavior. Patient endorsed auditory hallucination that had been continuous, poor sleep, racing thoughts, head feeling crowded. Given these symptoms she was involunarily committed and transferred to [facility] on 03/07/20.  She's had several visits in our office over the last several months for odd behavior, "seizure like activity", unresponsiveness. She is following with psychiatry who had her on venlafaxine ER 75 mg. During our visits we decided that venlafaxine was ineffective so this was slowly weaned off.   During her stay at [facility], which was May 22nd through May 28th, she was initiated on Seroquel 50 mg. She's been evaluated by neurology twice, last visit being 03/23/20, and she's met with her therapist once.   Today she endorses that she's feeling better overall, she denies hearing auditory voices. She's feeling less angry and is sleeping better. Her father is with her today and endorses things are overall better. Her anger outbursts have nearly resolved. She is not back to work yet, would like to return to work at some point.   She has a follow up appointment scheduled with her therapist and neurologist for next month. She is planning on set up a visit with her psychiatry provider soon. She is  compliant to her Seroquel which was increased to 75 mg by her neurologist.   Review of Systems  Respiratory: Negative for shortness of breath.   Cardiovascular: Negative for chest pain and palpitations.  Psychiatric/Behavioral:       See HPI       Past Medical History:  Diagnosis Date  . Asthma   . Depression   . Lumbar herniated disc   . Tetanus      Social History   Socioeconomic History  . Marital status: Single    Spouse name: Not on file  . Number of children: Not on file  . Years of education: Not on file  . Highest education level: Not on file  Occupational History  . Not on file  Tobacco Use  . Smoking status: Never Smoker  . Smokeless tobacco: Never Used  Vaping Use  . Vaping Use: Never used  Substance and Sexual Activity  . Alcohol use: No  . Drug use: Yes    Types: Marijuana  . Sexual activity: Yes    Partners: Male    Birth control/protection: None, Condom  Other Topics Concern  . Not on file  Social History Narrative   Single.   Student at [employer], [employer].   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   Enjoys reading, spending time with friends.   Social Determinants of Health   Financial Resource Strain:   . Difficulty of Paying Living Expenses:   Food Insecurity:   . Worried About Charity fundraiser in the Last Year:   . [place] in the Last Year:  Transportation Needs:   . Film/video editor (Medical):   Marland Kitchen Lack of Transportation (Non-Medical):   Physical Activity:   . Days of Exercise per Week:   . Minutes of Exercise per Session:   Stress:   . Feeling of Stress :   Social Connections:   . Frequency of Communication with Friends and Family:   . Frequency of Social Gatherings with Friends and Family:   . Attends Religious Services:   . Active Member of Clubs or Organizations:   . Attends Archivist Meetings:   Marland Kitchen Marital Status:   Intimate Partner Violence:   . Fear of Current or Ex-Partner:   . Emotionally Abused:   Marland Kitchen  Physically Abused:   . Sexually Abused:     Past Surgical History:  Procedure Laterality Date  . LAPAROSCOPIC APPENDECTOMY N/A 07/15/2016   Procedure: APPENDECTOMY LAPAROSCOPIC;  Surgeon: Clayburn Pert, MD;  Location: [facility] ORS;  Service: General;  Laterality: N/A;    Family History  Problem Relation Age of Onset  . Healthy Mother   . Healthy Father     Allergies  Allergen Reactions  . Shellfish Allergy Hives  . Eggs Or Egg-Derived Products Nausea And Vomiting    Current Outpatient Medications on File Prior to Visit  Medication Sig Dispense Refill  . albuterol (PROVENTIL) (2.5 MG/3ML) 0.083% nebulizer solution Take 3 mLs (2.5 mg total) by nebulization every 6 (six) hours as needed for wheezing or shortness of breath. 75 mL 1  . albuterol (VENTOLIN HFA) 108 (90 Base) MCG/ACT inhaler Inhale 2 puffs into the lungs every 4 (four) hours as needed for wheezing or shortness of breath. 8 g 1  . QUEtiapine (SEROQUEL) 50 MG tablet Take by mouth.    . venlafaxine XR (EFFEXOR-XR) 37.5 MG 24 hr capsule TAKE 1 CAPSULE BY MOUTH DAILY WITH BREAKFAST. 30 capsule 0   Current Facility-Administered Medications on File Prior to Visit  Medication Dose Route Frequency Provider Last Rate Last Admin  . Levonorgestrel IUD 19.5 mg  1 each Intrauterine Continuous Donnamae Jude, MD   19.5 mg at 04/26/18 1030    BP 108/70   Pulse 82   Temp (!) 95.9 F (35.5 C) (Temporal)   Ht 5' (1.524 m)   Wt 127 lb (57.6 kg)   SpO2 98%   BMI 24.80 kg/m    Objective:   Physical Exam  Constitutional: She is oriented to person, place, and time.  Cardiovascular: Normal rate and regular rhythm.  Respiratory: Effort normal and breath sounds normal.  Neurological: She is alert and oriented to person, place, and time.  Psychiatric: Her behavior is normal. Mood normal.  Appears much better           Assessment & Plan:

## 2020-03-26 NOTE — Patient Instructions (Signed)
Schedule a visit with Rene Kocher as discussed.  Follow up with Synetta Fail and Dr. Malvin Johns as scheduled.  It was a pleasure to see you today!

## 2020-03-27 ENCOUNTER — Other Ambulatory Visit: Payer: Self-pay | Admitting: Primary Care

## 2020-03-27 DIAGNOSIS — F209 Schizophrenia, unspecified: Secondary | ICD-10-CM

## 2020-03-27 DIAGNOSIS — F332 Major depressive disorder, recurrent severe without psychotic features: Secondary | ICD-10-CM

## 2020-04-10 ENCOUNTER — Ambulatory Visit (INDEPENDENT_AMBULATORY_CARE_PROVIDER_SITE_OTHER): Payer: BC Managed Care – PPO | Admitting: Psychology

## 2020-04-10 DIAGNOSIS — F322 Major depressive disorder, single episode, severe without psychotic features: Secondary | ICD-10-CM

## 2020-04-21 DIAGNOSIS — R259 Unspecified abnormal involuntary movements: Secondary | ICD-10-CM | POA: Diagnosis not present

## 2020-04-21 DIAGNOSIS — R440 Auditory hallucinations: Secondary | ICD-10-CM | POA: Diagnosis not present

## 2020-04-21 DIAGNOSIS — R4189 Other symptoms and signs involving cognitive functions and awareness: Secondary | ICD-10-CM | POA: Diagnosis not present

## 2020-04-21 DIAGNOSIS — Z8659 Personal history of other mental and behavioral disorders: Secondary | ICD-10-CM | POA: Diagnosis not present

## 2020-04-24 ENCOUNTER — Ambulatory Visit (INDEPENDENT_AMBULATORY_CARE_PROVIDER_SITE_OTHER): Payer: BC Managed Care – PPO | Admitting: Psychology

## 2020-04-24 DIAGNOSIS — F322 Major depressive disorder, single episode, severe without psychotic features: Secondary | ICD-10-CM

## 2020-05-07 ENCOUNTER — Ambulatory Visit (INDEPENDENT_AMBULATORY_CARE_PROVIDER_SITE_OTHER): Payer: BC Managed Care – PPO | Admitting: Psychology

## 2020-05-07 DIAGNOSIS — F322 Major depressive disorder, single episode, severe without psychotic features: Secondary | ICD-10-CM | POA: Diagnosis not present

## 2020-05-13 IMAGING — CT CT HEAD WITHOUT CONTRAST
3 series · 15 of 45 positions shown, 18 images · non-contrast
Comparison: None.

CLINICAL DATA: Twitching, difficult to open mouth

EXAM:
CT HEAD WITHOUT CONTRAST
TECHNIQUE: Contiguous axial images were obtained from the base of the skull
through the vertex without intravenous contrast.

[Series 2: head wo · axial · 0.42mm/px · z∈[+433,+548]mm · 9 of 28 slices shown, 12 images]
[im 3/28  brain]
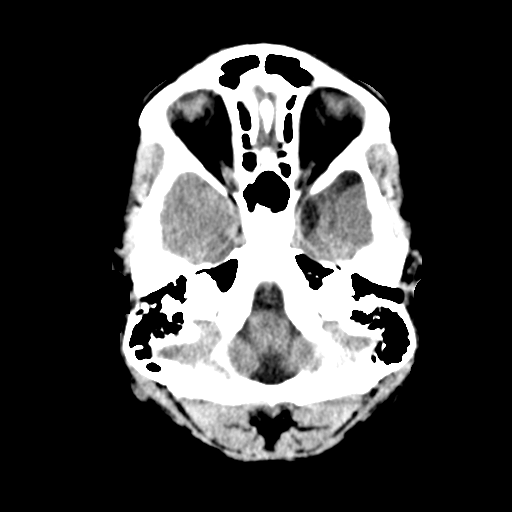
[im 3/28  bone]
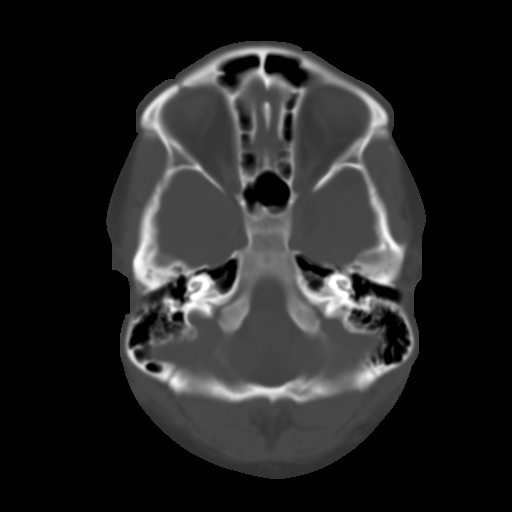
[im 6/28  brain]
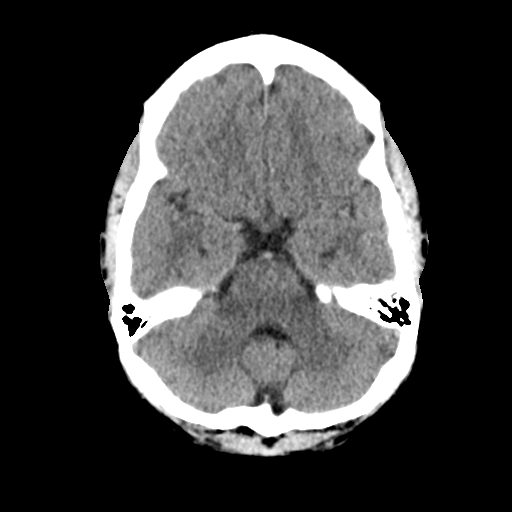
[im 9/28  brain]
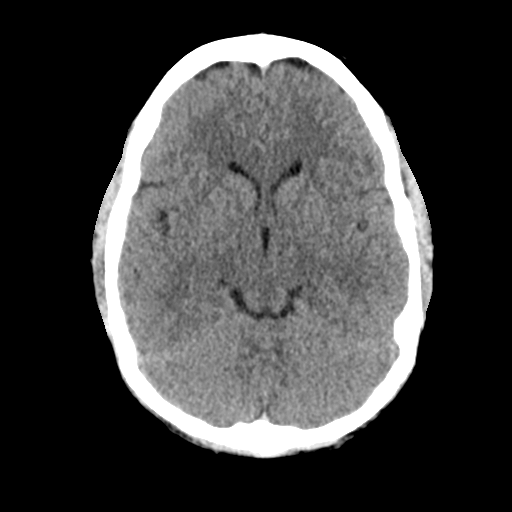
[im 12/28  brain]
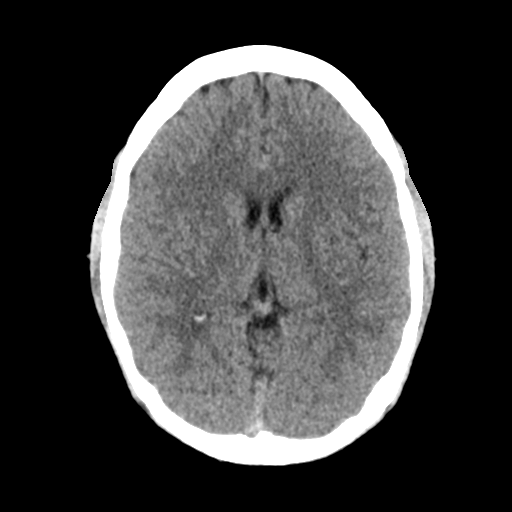
[im 15/28  brain]
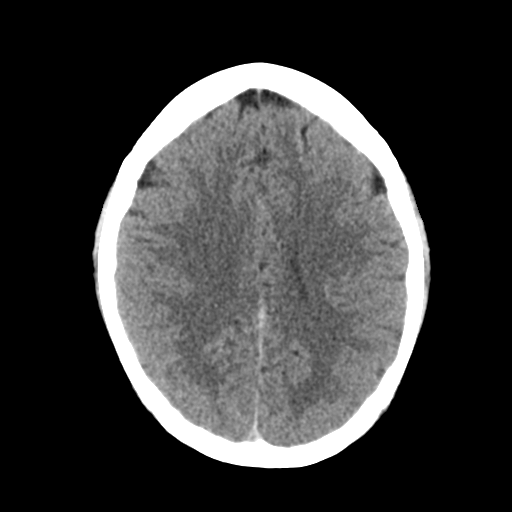
[im 15/28  bone]
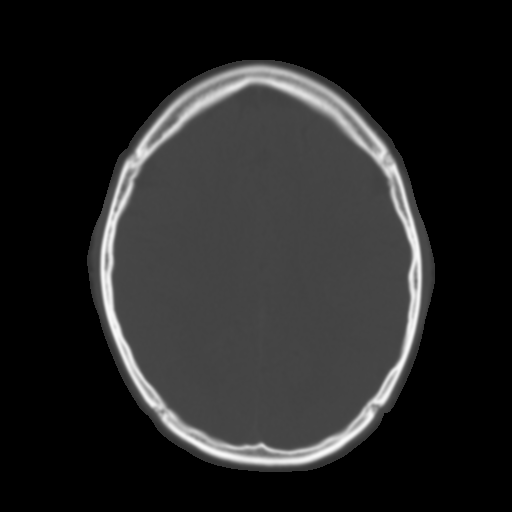
[im 17/28  brain]
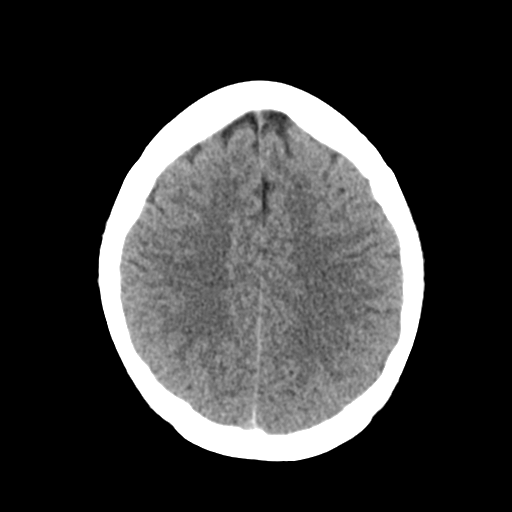
[im 20/28  brain]
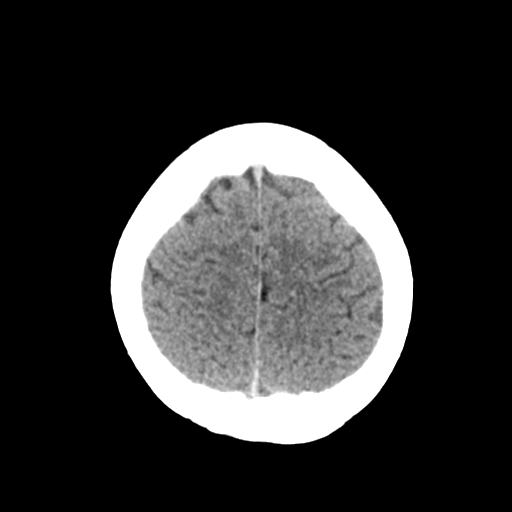
[im 23/28  brain]
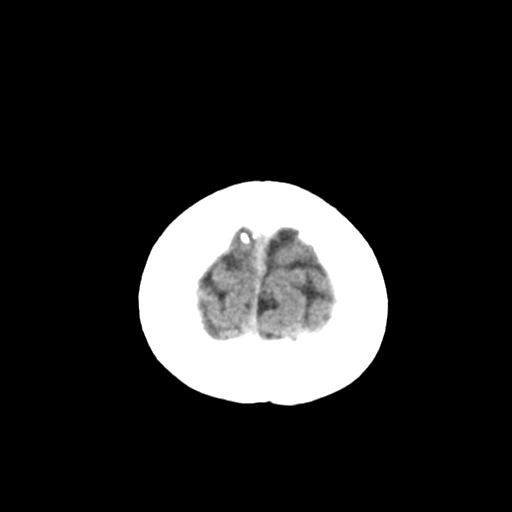
[im 26/28  brain]
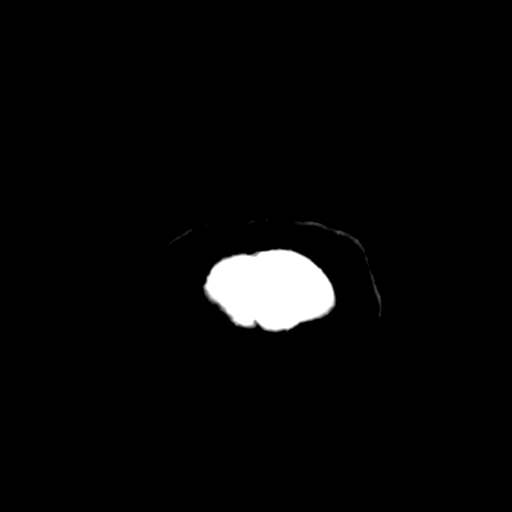
[im 26/28  bone]
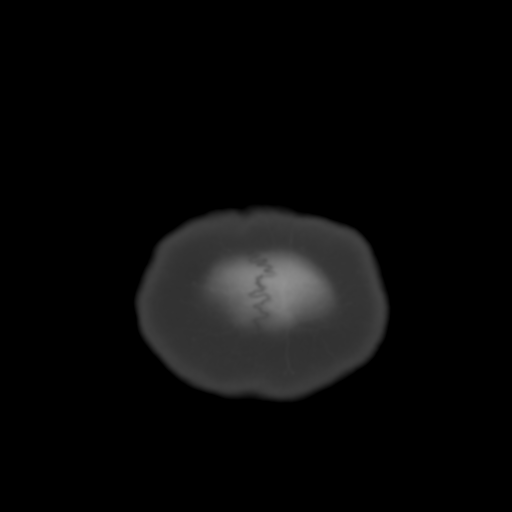

[Series 4: coronal soft tissue · coronal · 0.29mm/px · 3 of 63 slices shown]
[im 21/63  brain]
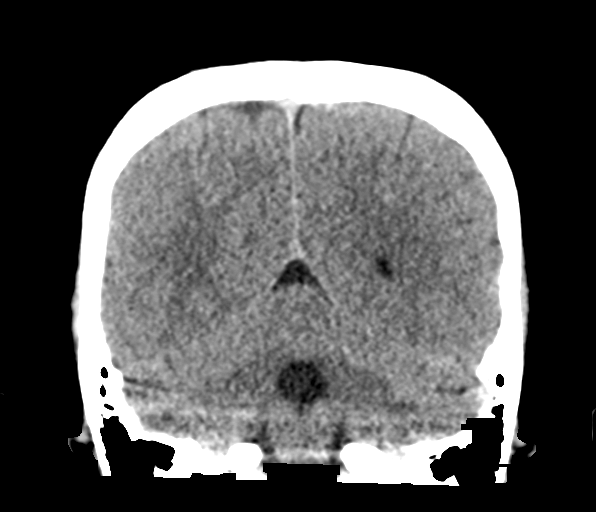
[im 28/63  brain]
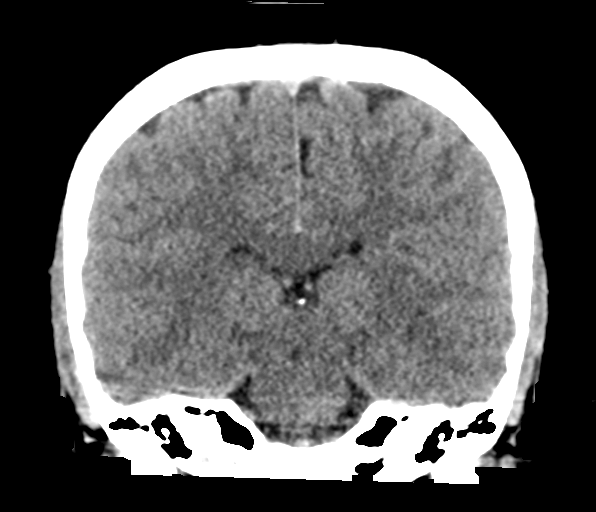
[im 35/63  brain]
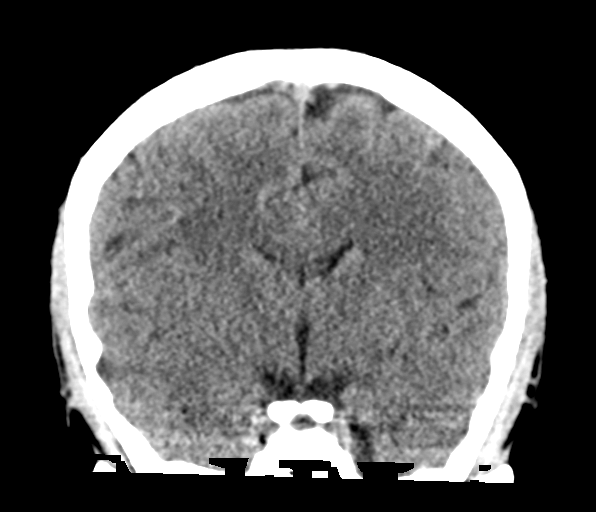

[Series 5: sagittal soft tissue · sagittal · 0.29mm/px · 3 of 54 slices shown]
[im 18/54  brain]
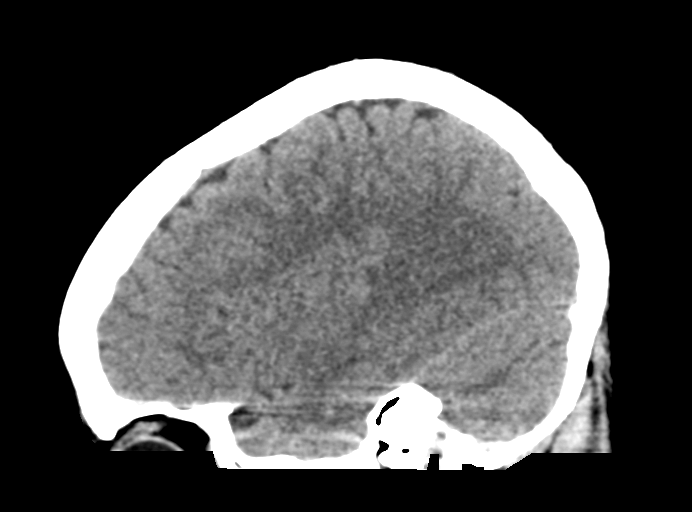
[im 27/54  brain]
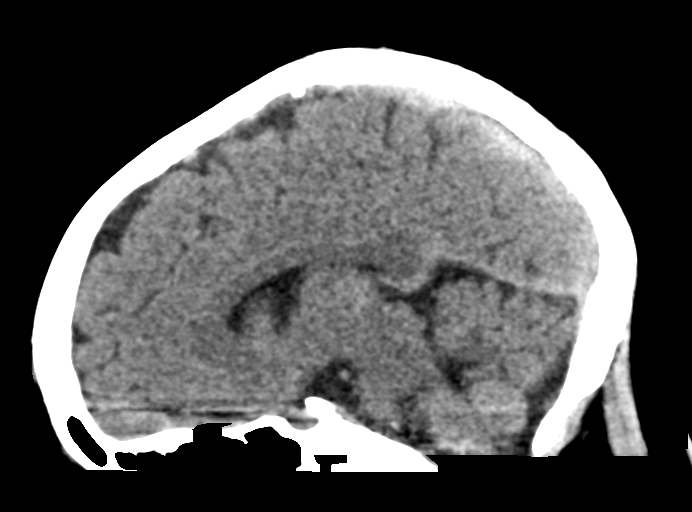
[im 36/54  brain]
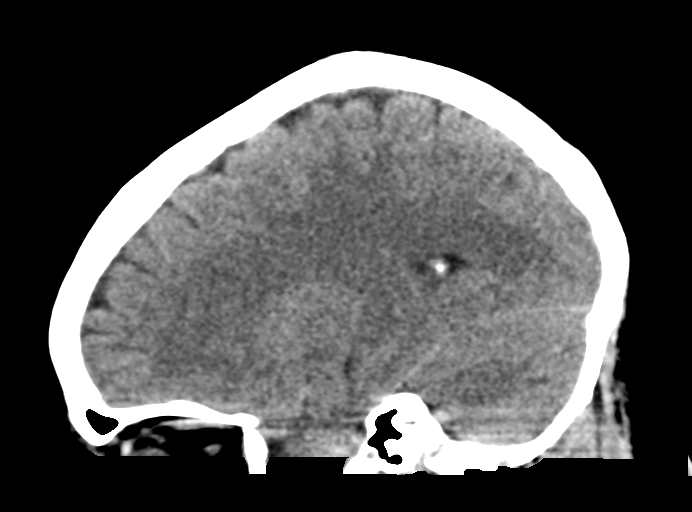

[15 of 45 positions shown; findings below may reference images not displayed]

FINDINGS: Brain: No evidence of acute infarction, hemorrhage, hydrocephalus,
extra-axial collection or mass lesion/mass effect.

Vascular: No hyperdense vessel or unexpected calcification.

Skull: Normal. Negative for fracture or focal lesion.

Sinuses/Orbits: No acute finding.

Other: None
IMPRESSION: Negative non contrasted CT appearance of the brain.

## 2020-05-19 ENCOUNTER — Emergency Department
Admission: EM | Admit: 2020-05-19 | Discharge: 2020-05-20 | Disposition: A | Discharge status: Other Acute Inpatient Hospital | Payer: BC Managed Care – PPO | Source: Home / Self Care | Attending: Emergency Medicine | Admitting: Emergency Medicine

## 2020-05-19 ENCOUNTER — Encounter: Payer: Self-pay | Admitting: Intensive Care

## 2020-05-19 ENCOUNTER — Other Ambulatory Visit: Payer: Self-pay

## 2020-05-19 DIAGNOSIS — J45909 Unspecified asthma, uncomplicated: Secondary | ICD-10-CM | POA: Insufficient documentation

## 2020-05-19 DIAGNOSIS — F4310 Post-traumatic stress disorder, unspecified: Secondary | ICD-10-CM | POA: Diagnosis not present

## 2020-05-19 DIAGNOSIS — Z79899 Other long term (current) drug therapy: Secondary | ICD-10-CM | POA: Insufficient documentation

## 2020-05-19 DIAGNOSIS — F1210 Cannabis abuse, uncomplicated: Secondary | ICD-10-CM | POA: Insufficient documentation

## 2020-05-19 DIAGNOSIS — Z20822 Contact with and (suspected) exposure to covid-19: Secondary | ICD-10-CM | POA: Insufficient documentation

## 2020-05-19 DIAGNOSIS — F29 Unspecified psychosis not due to a substance or known physiological condition: Secondary | ICD-10-CM

## 2020-05-19 DIAGNOSIS — Z30430 Encounter for insertion of intrauterine contraceptive device: Secondary | ICD-10-CM

## 2020-05-19 DIAGNOSIS — G4700 Insomnia, unspecified: Secondary | ICD-10-CM | POA: Diagnosis not present

## 2020-05-19 DIAGNOSIS — F251 Schizoaffective disorder, depressive type: Secondary | ICD-10-CM | POA: Diagnosis not present

## 2020-05-19 DIAGNOSIS — F329 Major depressive disorder, single episode, unspecified: Secondary | ICD-10-CM | POA: Insufficient documentation

## 2020-05-19 DIAGNOSIS — F209 Schizophrenia, unspecified: Secondary | ICD-10-CM | POA: Insufficient documentation

## 2020-05-19 DIAGNOSIS — Z5181 Encounter for therapeutic drug level monitoring: Secondary | ICD-10-CM | POA: Diagnosis not present

## 2020-05-19 HISTORY — DX: Schizophrenia, unspecified: F20.9

## 2020-05-19 LAB — CBC
HCT: 38.4 % (ref 36.0–46.0)
Hemoglobin: 12.9 g/dL (ref 12.0–15.0)
MCH: 30.5 pg (ref 26.0–34.0)
MCHC: 33.6 g/dL (ref 30.0–36.0)
MCV: 90.8 fL (ref 80.0–100.0)
Platelets: 271 10*3/uL (ref 150–400)
RBC: 4.23 MIL/uL (ref 3.87–5.11)
RDW: 12.7 % (ref 11.5–15.5)
WBC: 7.8 10*3/uL (ref 4.0–10.5)
nRBC: 0 % (ref 0.0–0.2)

## 2020-05-19 LAB — URINE DRUG SCREEN, QUALITATIVE (ARMC ONLY)
Amphetamines, Ur Screen: NOT DETECTED
Barbiturates, Ur Screen: NOT DETECTED
Benzodiazepine, Ur Scrn: NOT DETECTED
Cannabinoid 50 Ng, Ur ~~LOC~~: POSITIVE — AB
Cocaine Metabolite,Ur ~~LOC~~: NOT DETECTED
MDMA (Ecstasy)Ur Screen: NOT DETECTED
Methadone Scn, Ur: NOT DETECTED
Opiate, Ur Screen: NOT DETECTED
Phencyclidine (PCP) Ur S: NOT DETECTED
Tricyclic, Ur Screen: NOT DETECTED

## 2020-05-19 LAB — COMPREHENSIVE METABOLIC PANEL
ALT: 9 U/L (ref 0–44)
AST: 16 U/L (ref 15–41)
Albumin: 5 g/dL (ref 3.5–5.0)
Alkaline Phosphatase: 57 U/L (ref 38–126)
Anion gap: 9 (ref 5–15)
BUN: 12 mg/dL (ref 6–20)
CO2: 23 mmol/L (ref 22–32)
Calcium: 9.6 mg/dL (ref 8.9–10.3)
Chloride: 106 mmol/L (ref 98–111)
Creatinine, Ser: 0.72 mg/dL (ref 0.44–1.00)
GFR calc Af Amer: >60 mL/min (ref >60)
GFR calc non Af Amer: >60 mL/min (ref >60)
Glucose, Bld: 94 mg/dL (ref 70–99)
Potassium: 3.9 mmol/L (ref 3.5–5.1)
Sodium: 138 mmol/L (ref 135–145)
Total Bilirubin: 0.8 mg/dL (ref 0.3–1.2)
Total Protein: 8.3 g/dL — ABNORMAL HIGH (ref 6.5–8.1)

## 2020-05-19 LAB — ACETAMINOPHEN LEVEL: Acetaminophen (Tylenol), Serum: <10 ug/mL — ABNORMAL LOW (ref 10–30)

## 2020-05-19 LAB — SALICYLATE LEVEL: Salicylate Lvl: <7 mg/dL — ABNORMAL LOW (ref 7.0–30.0)

## 2020-05-19 LAB — SARS CORONAVIRUS 2 BY RT PCR (HOSPITAL ORDER, PERFORMED IN ~~LOC~~ HOSPITAL LAB): SARS Coronavirus 2: NEGATIVE

## 2020-05-19 LAB — ETHANOL: Alcohol, Ethyl (B): <10 mg/dL (ref <10)

## 2020-05-19 LAB — POCT PREGNANCY, URINE: Preg Test, Ur: NEGATIVE

## 2020-05-19 MED ORDER — ESCITALOPRAM OXALATE 10 MG PO TABS: 10.0000 mg | ORAL_TABLET | Freq: Every day | ORAL | Status: DC

## 2020-05-19 MED ORDER — QUETIAPINE FUMARATE 25 MG PO TABS
75.0000 mg | ORAL_TABLET | Freq: Every day | ORAL | Status: DC
  Administered 2020-05-19: 75 mg via ORAL
  Filled 2020-05-19: qty 3

## 2020-05-19 NOTE — BH Assessment (Signed)
Assessment Note  Jill Osborne is an 19 y.o. female who presents to the ER via her mother due to the recent changes in her behaviors and mental state. Per the patient's mother Asher Muir Crawford-[phone]), she noticed the changes last Tuesday. She was having increase forgetfulness, responding to internal stimuli and having thought blocking. Patient was recently inpatient with [facility] and discharged the first week of June (2021).  Per the patient, she was brought to the ER because she was "interpreting the world wrong." She recently asked her mother to move to the city, because she saw people hanging in the trees near her home. While sharing this, the patient became tearful and started talking about other things that weren't related/connected to the conversation. During the interview, she was having thought blocking and would stare, while remaining silent, before answering. She was able to give her name and date of birth. However, she was unable to accurately share what brought her to the ER.  Moher further reports, she was prescribed Effexor in the past and she had a bad reaction to it. Mother believes it was due to her being diagnosed with depression but "her true diagnosis is schizophrenia." The Effexor caused increase confusion, hallucinations and changes in her moods. She's currently prescribed Seroquel 75mg  and Lexapro 10mg , by her outpatient provider. Patient's mother and provider attempted to get her connected with a partial hospitalization program, but all of them were virtual and the patient will need in person, face-to-face.  Diagnosis: Schizophrenia   Past Medical History:  Past Medical History:  Diagnosis Date  . Asthma   . Depression   . Lumbar herniated disc   . Schizophrenia (HCC)   . Tetanus     Past Surgical History:  Procedure Laterality Date  . LAPAROSCOPIC APPENDECTOMY N/A 07/15/2016   Procedure: APPENDECTOMY LAPAROSCOPIC;  Surgeon: , MD;   Location: [facility] ORS;  Service: General;  Laterality: N/A;    Family History:  Family History  Problem Relation Age of Onset  . Healthy Mother   . Healthy Father     Social History:  reports that she has never smoked. She has never used smokeless tobacco. She reports current alcohol use. She reports current drug use. Drug: Marijuana.  Additional Social History:  Alcohol / Drug Use Pain Medications: See PTA Prescriptions: See PTA Over the Counter: See PTA History of alcohol / drug use?: Yes Longest period of sobriety (when/how long): Unable to quantify Substance #1 Name of Substance 1: Cannabis  CIWA: CIWA-Ar BP: 105/79 Pulse Rate: 81 COWS:    Allergies:  Allergies  Allergen Reactions  . Shellfish Allergy Hives  . Eggs Or Egg-Derived Products Nausea And Vomiting    Home Medications: (Not in a hospital admission)   OB/GYN Status:  No LMP recorded. (Menstrual status: IUD).  General Assessment Data Location of Assessment: [facility] ED TTS Assessment: In system Is this a Tele or Face-to-Face Assessment?: Face-to-Face Is this an Initial Assessment or a Re-assessment for this encounter?: Initial Assessment Patient Accompanied by:: N/A Language Other than English: No Living Arrangements: Other (Comment) What gender do you identify as?: Female Date Telepsych consult ordered in [facility]: 05/19/20 Time Telepsych consult ordered in [facility]: 1823 Marital status: Single Pregnancy Status: No Living Arrangements: Parent Can pt return to current living arrangement?: Yes Admission Status: Voluntary Is patient capable of signing voluntary admission?: Yes Referral Source: Self/Family/Friend Insurance type: BCBS  Medical Screening Exam [facility] Walk-in ONLY) Medical Exam completed: Yes  Crisis Care Plan Living Arrangements:  Parent Legal Guardian: Other: (Self) Name of Psychiatrist: Reports of none Name of Therapist: Reports of none  Education Status Is patient currently in school?: No Is  the patient employed, unemployed or receiving disability?: Unemployed  Risk to self with the past 6 months Suicidal Ideation: No Has patient been a risk to self within the past 6 months prior to admission? : No Suicidal Intent: No Has patient had any suicidal intent within the past 6 months prior to admission? : No Is patient at risk for suicide?: No Suicidal Plan?: No Has patient had any suicidal plan within the past 6 months prior to admission? : No Access to Means: No What has been your use of drugs/alcohol within the last 12 months?: Reports of none Previous Attempts/Gestures: No How many times?: 0 Other Self Harm Risks: Reports of none Triggers for Past Attempts: Hallucinations Intentional Self Injurious Behavior: None Family Suicide History: Unknown Recent stressful life event(s): Other (Comment) Persecutory voices/beliefs?: No Depression: No Depression Symptoms: Isolating, Feeling worthless/self pity, Tearfulness Substance abuse history and/or treatment for substance abuse?: No Suicide prevention information given to non-admitted patients: Not applicable  Risk to Others within the past 6 months Homicidal Ideation: No Does patient have any lifetime risk of violence toward others beyond the six months prior to admission? : No Thoughts of Harm to Others: No Current Homicidal Intent: No Current Homicidal Plan: No Access to Homicidal Means: No Identified Victim: Reports of none History of harm to others?: No Assessment of Violence: None Noted Violent Behavior Description: Reports of none Does patient have access to weapons?: No Criminal Charges Pending?: No Does patient have a court date: No Is patient on probation?: No  Psychosis Hallucinations: None noted Delusions: None noted  Mental Status Report Appearance/Hygiene: Unremarkable, In scrubs Eye Contact: Fair Motor Activity: Freedom of movement, Unremarkable Speech: Logical/coherent, Unremarkable Level of  Consciousness: Alert Mood: Depressed, Anxious, Helpless, Preoccupied, Sad, Pleasant Affect: Appropriate to circumstance, Depressed, Anxious, Sad Anxiety Level: Minimal Thought Processes: Flight of Ideas, Thought Blocking Judgement: Partial Orientation: Person, Place Obsessive Compulsive Thoughts/Behaviors: Moderate  Cognitive Functioning Concentration: Decreased Memory: Remote Impaired, Recent Impaired Is patient IDD: No Insight: Fair Impulse Control: Fair Appetite: Poor Have you had any weight changes? : No Change Sleep: Decreased Total Hours of Sleep: 0 Vegetative Symptoms: None  ADLScreening [facility] Assessment Services) Patient's cognitive ability adequate to safely complete daily activities?: Yes Patient able to express need for assistance with ADLs?: Yes Independently performs ADLs?: Yes (appropriate for developmental age)  Prior Inpatient Therapy Prior Inpatient Therapy: Yes Prior Therapy Dates: 04/2020 & 07/2019 Prior Therapy Facilty/Provider(s): [facility] Reason for Treatment: Schizophrenia  Prior Outpatient Therapy Prior Outpatient Therapy: No (Unknown) Does patient have an ACCT team?: Unknown Does patient have Intensive In-House Services?  : No Does patient have [facility] services? : No Does patient have P4CC services?: No  ADL Screening (condition at time of admission) Patient's cognitive ability adequate to safely complete daily activities?: Yes Is the patient deaf or have difficulty hearing?: No Does the patient have difficulty seeing, even when wearing glasses/contacts?: No Does the patient have difficulty concentrating, remembering, or making decisions?: No Patient able to express need for assistance with ADLs?: Yes Does the patient have difficulty dressing or bathing?: No Independently performs ADLs?: Yes (appropriate for developmental age) Does the patient have difficulty walking or climbing stairs?: No Weakness of Legs: None Weakness of  Arms/Hands: None  Home Assistive Devices/Equipment Home Assistive Devices/Equipment: None  Therapy Consults (therapy consults require  a physician order) PT Evaluation Needed: No OT Evalulation Needed: No SLP Evaluation Needed: No Abuse/Neglect Assessment (Assessment to be complete while patient is alone) Abuse/Neglect Assessment Can Be Completed: Yes Physical Abuse: Denies Verbal Abuse: Denies Sexual Abuse: Denies Exploitation of patient/patient's resources: Denies Self-Neglect: Denies Values / Beliefs Cultural Requests During Hospitalization: None Spiritual Requests During Hospitalization: None Consults Spiritual Care Consult Needed: No Transition of Care Team Consult Needed: No Advance Directives (For Healthcare) Does Patient Have a Medical Advance Directive?: No Would patient like information on creating a medical advance directive?: No - Patient declined  Disposition:  Disposition Initial Assessment Completed for this Encounter: Yes  On Site Evaluation by:   Reviewed with Physician:    Lilyan Gilford MS, LCAS, [facility], NCC Therapeutic Triage Specialist 05/19/2020 9:08 PM

## 2020-05-19 NOTE — ED Triage Notes (Signed)
Patient brought to ER by mom. Released from [facility] first week of June. Medication recently increased due to ticks, uncontrollable laughing and speaking to herself. Then mom reports last week she noticed a change in behavior again and describes starring off into space, shaking hands, talking to herself, and forgetful. Diagnosed with schizophrenia with psychosis. Patient Denies SI/HI. Patient states "I have been hearing voices that tell me what to do. I don't want to hurt myself but I just want to feel like myself again"

## 2020-05-19 NOTE — ED Notes (Signed)
Hourly rounding reveals patient in room. No complaints, stable, in no acute distress. Q15 minute rounds and monitoring via Rover and Officer to continue.   

## 2020-05-19 NOTE — ED Notes (Addendum)
Pt dressed out into burgundy scrubs with this tech and Tracey, EDT in the rm. Pt belongings consist of blue jeans, bra, t-shirt, boots, socks and panties. Pt mother is taking all of pt belongings home with her. Pt calm and cooperative while dressing out.

## 2020-05-19 NOTE — ED Notes (Signed)
Report to include Situation, Background, Assessment, and Recommendations received from Amy RN. Patient alert and oriented, warm and dry, in no acute distress. Patient denies SI, HI and pain. Reported AVH.Patient made aware of Q15 minute rounds and Psychologist, counselling presence for their safety. Patient instructed to come to me with needs or concerns.

## 2020-05-19 NOTE — ED Provider Notes (Addendum)
[facility] Emergency Department Provider Note  Time seen: 6:42 PM  I have reviewed the triage vital signs and the nursing notes.   HISTORY  Chief Complaint Medication reaction/psychosis  HPI Jill Osborne is a 19 y.o. female with a past medical history of depression, asthma, schizophrenia, presents to the emergency department for psychiatric evaluation.  According to the patient and report patient was last hospitalized at [facility] approximately 2 months ago.  Patient recently had medications increased, they are reporting voices and changes in her baseline mental state.  Here the patient is awake and alert, she will communicate at times and other times chooses not to answer my questions.  She does specifically deny any suicidal homicidal ideation.  Patient is agreeable to stay for evaluation.  Agreeable to lab work.   Past Medical History:  Diagnosis Date  . Asthma   . Depression   . Lumbar herniated disc   . Schizophrenia (HCC)   . Tetanus     Patient Active Problem List   Diagnosis Date Noted  . Schizophrenia (HCC) 03/26/2020  . Seizure-like activity (HCC) 02/10/2020  . History of depression 02/10/2020  . Memory impairment 02/10/2020  . Transient confusion 02/10/2020  . Unresponsive episode 02/10/2020  . Weight loss, abnormal 01/13/2020  . Fatigue 01/13/2020  . MDD (major depressive disorder), recurrent severe, without psychosis (HCC) 07/23/2019    Past Surgical History:  Procedure Laterality Date  . LAPAROSCOPIC APPENDECTOMY N/A 07/15/2016   Procedure: APPENDECTOMY LAPAROSCOPIC;  Surgeon: Ricarda Frame, MD;  Location: [facility] ORS;  Service: General;  Laterality: N/A;    Prior to Admission medications   Medication Sig Start Date End Date Taking? Authorizing Provider  albuterol (PROVENTIL) (2.5 MG/3ML) 0.083% nebulizer solution Take 3 mLs (2.5 mg total) by nebulization every 6 (six) hours as needed for wheezing or shortness of breath.  10/22/19   Triplett, Cari B, FNP  albuterol (VENTOLIN HFA) 108 (90 Base) MCG/ACT inhaler Inhale 2 puffs into the lungs every 4 (four) hours as needed for wheezing or shortness of breath. 10/22/19   Triplett, Cari B, FNP  QUEtiapine (SEROQUEL) 50 MG tablet Take by mouth.    [provider]  venlafaxine XR (EFFEXOR-XR) 37.5 MG 24 hr capsule TAKE 1 CAPSULE BY MOUTH DAILY WITH BREAKFAST. 02/21/20   Doreene Nest, NP    Allergies  Allergen Reactions  . Shellfish Allergy Hives  . Eggs Or Egg-Derived Products Nausea And Vomiting    Family History  Problem Relation Age of Onset  . Healthy Mother   . Healthy Father     Social History Social History   Tobacco Use  . Smoking status: Never Smoker  . Smokeless tobacco: Never Used  Vaping Use  . Vaping Use: Never used  Substance Use Topics  . Alcohol use: Yes  . Drug use: Yes    Types: Marijuana    Review of Systems Constitutional: Negative for fever. Cardiovascular: Negative for chest pain. Respiratory: Negative for shortness of breath. Gastrointestinal: Negative for abdominal pain Musculoskeletal: Negative for musculoskeletal complaints Neurological: Negative for headache All other ROS negative  ____________________________________________   PHYSICAL EXAM:  VITAL SIGNS: ED Triage Vitals  Enc Vitals Group     BP 05/19/20 1747 105/79     Pulse Rate 05/19/20 1747 81     Resp 05/19/20 1747 18     Temp 05/19/20 1747 98.2 F (36.8 C)     Temp Source 05/19/20 1747 Oral     SpO2 05/19/20 1747  97 %     Weight 05/19/20 1746 277 lb 12.5 oz (126 kg)     Height 05/19/20 1746 5' (1.524 m)     Head Circumference --      Peak Flow --      Pain Score 05/19/20 1814 6     Pain Loc --      Pain Edu? --      Excl. in GC? --     Constitutional: Awake alert, no acute distress.  Patient answers questions at times other times chooses not to answer questions.  Does follow basic commands.  Sitting upright in bed drinking a glass of  water.  Denies any SI or HI. Eyes: Normal exam ENT      Head: Normocephalic and atraumatic.Marland Kitchen      Mouth/Throat: Mucous membranes are moist. Cardiovascular: Normal rate, regular rhythm Respiratory: Normal respiratory effort without tachypnea nor retractions. Breath sounds are clear Gastrointestinal: Soft and nontender. No distention.  Musculoskeletal: Nontender with normal range of motion in all extremities.  Neurologic:  Normal speech and language. No gross focal neurologic deficits  Skin:  Skin is warm, dry and intact.  Psychiatric: Patient acting appropriate at times other times will stare at me and not answer my question.  Specifically denies SI or HI.  Does not appear to be an immediate harm to herself or anyone else.  ____________________________________________    INITIAL IMPRESSION / ASSESSMENT AND PLAN / ED COURSE  Pertinent labs & imaging results that were available during my care of the patient were reviewed by me and considered in my medical decision making (see chart for details).   Patient presents to the emergency department for psychiatric evaluation.  Last hospitalized proximately 2 months ago, recently had some medication changes.  Currently the patient appears well answers questions appropriately at times at other times she will stare and will not answer the question.  Patient specifically denies any SI or HI, does not appear to be an immediate threat to herself or anyone else.  Does not appear to meet IVC criteria at this time but we will continue to closely monitor.  Patient is agreeable to psychiatric evaluation.  Lab work is pending at this time.  Psychiatry has seen and will likely be admitting to their service once a bed becomes available.  Jill Osborne was evaluated in Emergency Department on 05/19/2020 for the symptoms described in the history of present illness. She was evaluated in the context of the global COVID-19 pandemic, which necessitated consideration that  the patient might be at risk for infection with the SARS-CoV-2 virus that causes COVID-19. Institutional protocols and algorithms that pertain to the evaluation of patients at risk for COVID-19 are in a state of rapid change based on information released by regulatory bodies including the CDC and federal and state organizations. These policies and algorithms were followed during the patient's care in the ED.  The patient has been placed in psychiatric observation due to the need to provide a safe environment for the patient while obtaining psychiatric consultation and evaluation, as well as ongoing medical and medication management to treat the patient's condition.  The patient has not been placed under full IVC at this time.   ____________________________________________   FINAL CLINICAL IMPRESSION(S) / ED DIAGNOSES  Psychiatric evaluation   Minna Antis, MD 05/19/20 2259    Minna Antis, MD 05/19/20 2300

## 2020-05-20 ENCOUNTER — Encounter: Payer: Self-pay | Admitting: Psychiatry

## 2020-05-20 ENCOUNTER — Inpatient Hospital Stay
Admission: EM | Admit: 2020-05-20 | Discharge: 2020-05-26 | DRG: 885 | Disposition: A | Discharge status: Home / Self Care | Payer: BC Managed Care – PPO | Source: Intra-hospital | Attending: Psychiatry | Admitting: Psychiatry

## 2020-05-20 DIAGNOSIS — F4310 Post-traumatic stress disorder, unspecified: Secondary | ICD-10-CM | POA: Diagnosis present

## 2020-05-20 DIAGNOSIS — Z20822 Contact with and (suspected) exposure to covid-19: Secondary | ICD-10-CM | POA: Diagnosis present

## 2020-05-20 DIAGNOSIS — F251 Schizoaffective disorder, depressive type: Principal | ICD-10-CM | POA: Diagnosis present

## 2020-05-20 DIAGNOSIS — Z30430 Encounter for insertion of intrauterine contraceptive device: Secondary | ICD-10-CM

## 2020-05-20 DIAGNOSIS — Z5181 Encounter for therapeutic drug level monitoring: Secondary | ICD-10-CM

## 2020-05-20 DIAGNOSIS — G4700 Insomnia, unspecified: Secondary | ICD-10-CM | POA: Diagnosis present

## 2020-05-20 MED ORDER — ALBUTEROL SULFATE HFA 108 (90 BASE) MCG/ACT IN AERS
2.0000 | INHALATION_SPRAY | RESPIRATORY_TRACT | Status: DC | PRN
  Filled 2020-05-20: qty 6.7

## 2020-05-20 MED ORDER — LEVONORGESTREL 19.5 MG IU IUD: 1.0000 | INTRAUTERINE_SYSTEM | INTRAUTERINE | Status: DC

## 2020-05-20 MED ORDER — VENLAFAXINE HCL ER 75 MG PO CP24
75.0000 mg | ORAL_CAPSULE | Freq: Every day | ORAL | Status: DC
  Administered 2020-05-20: 75 mg via ORAL
  Filled 2020-05-20: qty 1

## 2020-05-20 MED ORDER — QUETIAPINE FUMARATE 25 MG PO TABS: 50.0000 mg | ORAL_TABLET | Freq: Every day | ORAL | Status: DC

## 2020-05-20 MED ORDER — MAGNESIUM HYDROXIDE 400 MG/5ML PO SUSP: 30.0000 mL | Freq: Every day | ORAL | Status: DC | PRN

## 2020-05-20 MED ORDER — ALBUTEROL SULFATE (2.5 MG/3ML) 0.083% IN NEBU: 2.5000 mg | INHALATION_SOLUTION | Freq: Four times a day (QID) | RESPIRATORY_TRACT | Status: DC | PRN

## 2020-05-20 MED ORDER — QUETIAPINE FUMARATE 100 MG PO TABS
100.0000 mg | ORAL_TABLET | Freq: Every day | ORAL | Status: DC
  Administered 2020-05-20: 100 mg via ORAL
  Filled 2020-05-20: qty 1

## 2020-05-20 MED ORDER — ESCITALOPRAM OXALATE 10 MG PO TABS
10.0000 mg | ORAL_TABLET | Freq: Every day | ORAL | Status: DC
  Administered 2020-05-20 – 2020-05-26 (×7): 10 mg via ORAL
  Filled 2020-05-20 (×7): qty 1

## 2020-05-20 MED ORDER — LORAZEPAM 1 MG PO TABS
1.0000 mg | ORAL_TABLET | ORAL | Status: DC | PRN
  Administered 2020-05-20 – 2020-05-25 (×7): 1 mg via ORAL
  Filled 2020-05-20 (×7): qty 1

## 2020-05-20 MED ORDER — ACETAMINOPHEN 325 MG PO TABS
650.0000 mg | ORAL_TABLET | Freq: Four times a day (QID) | ORAL | Status: DC | PRN
  Administered 2020-05-20 – 2020-05-23 (×6): 650 mg via ORAL
  Filled 2020-05-20 (×6): qty 2

## 2020-05-20 MED ORDER — ALUM & MAG HYDROXIDE-SIMETH 200-200-20 MG/5ML PO SUSP: 30.0000 mL | ORAL | Status: DC | PRN

## 2020-05-20 NOTE — [facility] Suicide Risk Assessment (Signed)
[facility] Admission Suicide Risk Assessment   Nursing information obtained from:  Patient Demographic factors:  Adolescent or young adult Current Mental Status:  NA Loss Factors:  NA Historical Factors:  NA Risk Reduction Factors:  Sense of responsibility to family, Living with another person, especially a relative, Positive social support, Positive therapeutic relationship  Total Time spent with patient: 1 hour Principal Problem: Schizoaffective disorder, depressive type (HCC) Diagnosis:  Principal Problem:   Schizoaffective disorder, depressive type (HCC)  Subjective Data: Patient seen chart reviewed.  19 year old woman presented to the emergency room at the encouragement of her family because of worsening symptoms of withdrawal disorganized thinking and mood instability.  Patient had not done anything to intentionally harm her self and denies any suicidal ideation at this point although she is endorsing psychotic symptoms  Continued Clinical Symptoms:  Alcohol Use Disorder Identification Test Final Score (AUDIT): 3 The "Alcohol Use Disorders Identification Test", Guidelines for Use in Primary Care, Second Edition.  World Science writer [facility]). Score between 0-7:  no or low risk or alcohol related problems. Score between 8-15:  moderate risk of alcohol related problems. Score between 16-19:  high risk of alcohol related problems. Score 20 or above:  warrants further diagnostic evaluation for alcohol dependence and treatment.   CLINICAL FACTORS:   Schizophrenia:   Less than 80 years old   Musculoskeletal: Strength & Muscle Tone: within normal limits Gait & Station: normal Patient leans: N/A  Psychiatric Specialty Exam: Physical Exam Vitals and nursing note reviewed.  Constitutional:      Appearance: She is well-developed.  HENT:     Head: Normocephalic and atraumatic.  Eyes:     Conjunctiva/sclera: Conjunctivae normal.     Pupils: Pupils are equal, round, and reactive to  light.  Cardiovascular:     Heart sounds: Normal heart sounds.  Pulmonary:     Effort: Pulmonary effort is normal.  Abdominal:     Palpations: Abdomen is soft.  Musculoskeletal:        General: Normal range of motion.     Cervical back: Normal range of motion.  Skin:    General: Skin is warm and dry.  Neurological:     General: No focal deficit present.     Mental Status: She is alert.  Psychiatric:        Attention and Perception: She perceives auditory and visual hallucinations.        Mood and Affect: Mood is anxious. Affect is blunt.        Speech: Speech is delayed.        Behavior: Behavior is slowed and withdrawn.        Thought Content: Thought content is paranoid and delusional. Thought content does not include homicidal or suicidal ideation.        Cognition and Memory: Cognition is impaired.        Judgment: Judgment normal.     Review of Systems  Constitutional: Negative.   HENT: Negative.   Eyes: Negative.   Respiratory: Negative.   Cardiovascular: Negative.   Gastrointestinal: Negative.   Musculoskeletal: Negative.   Skin: Negative.   Neurological: Negative.   Psychiatric/Behavioral: Positive for confusion, decreased concentration, dysphoric mood and hallucinations. The patient is nervous/anxious.     Blood pressure (!) 109/98, pulse 94, temperature 98.5 F (36.9 C), temperature source Oral, resp. rate 18, height 5' (1.524 m), weight 126 kg, SpO2 100 %.Body mass index is 54.25 kg/m.  General Appearance: Casual  Eye Contact:  Fair  Speech:  Slow  Volume:  Decreased  Mood:  Anxious and Dysphoric  Affect:  Depressed and Tearful  Thought Process:  Coherent  Orientation:  Full (Time, Place, and Person)  Thought Content:  Hallucinations: Auditory Visual  Suicidal Thoughts:  No  Homicidal Thoughts:  No  Memory:  Immediate;   Fair Recent;   Fair Remote;   Fair  Judgement:  Fair  Insight:  Fair  Psychomotor Activity:  Decreased  Concentration:   Concentration: Fair  Recall:  [employer] of Knowledge:  Fair  Language:  Fair  Akathisia:  No  Handed:  Right  AIMS (if indicated):     Assets:  Desire for Improvement Housing Physical Health Resilience Social Support  ADL's:  Impaired  Cognition:  Impaired,  Mild  Sleep:  Number of Hours: 1.15      COGNITIVE FEATURES THAT CONTRIBUTE TO RISK:  Loss of executive function    SUICIDE RISK:   Minimal: No identifiable suicidal ideation.  Patients presenting with no risk factors but with morbid ruminations; may be classified as minimal risk based on the severity of the depressive symptoms  PLAN OF CARE: Patient who appears to have worsening early-onset psychotic symptoms.  She is agreeable to appropriate treatment plan.  Continue 15-minute checks.  Medication as appropriate.  Include in individual and group assessment and therapy before arranging for appropriate discharge plans  I certify that inpatient services furnished can reasonably be expected to improve the patient's condition.   Mordecai Rasmussen, MD 05/20/2020, 3:52 PM

## 2020-05-20 NOTE — Progress Notes (Signed)
Patient presents with temp of 99.0 during  vitals this morning. She reports feeling cold and achy. Tylenol 650mg  given to alleviate symptoms. Patient tolerated with out incident.    Cleo Butler-Nicholson, LPN

## 2020-05-20 NOTE — Progress Notes (Signed)
Recreation Therapy Notes   Date: 05/20/2020  Time: 9:30 am   Location: Craft room     Behavioral response: N/A   Intervention Topic: Strengths   Discussion/Intervention: Patient did not attend group.   Clinical Observations/Feedback:  Patient did not attend group.   Coye Dawood LRT/CTRS        Chalsea Darko 05/20/2020 11:54 AM

## 2020-05-20 NOTE — Progress Notes (Signed)
Patient presents with bizarre behavior, patient was walking around the unit backwards and laughing. Patient then had a crying spell this evening. Patient endorses AVH, anxiety and depression. Patient denies SI/HI. Patient given education, support, and encouragement to be active in her treatment plan. Patient compliant with medication administration per MD orders. Patient being monitored Q 15 minutes for safety per unit protocol. Patient remains safe on the unit.
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
## 2020-05-20 NOTE — Plan of Care (Signed)
Patient endorses AVH  Problem: Education: Goal: Will be free of psychotic symptoms Outcome: Not Progressing

## 2020-05-20 NOTE — ED Notes (Signed)
Patient transferred to BMU via NT. Patient is stable in NAD. VS are stable. Report given to [facility] RN. Patient belongings transferred to BMU. No issues.

## 2020-05-20 NOTE — Tx Team (Addendum)
Initial Treatment Plan 05/20/2020 1:57 AM [name] UYQ:[id number]    PATIENT STRESSORS: Health problems Medication change or noncompliance   PATIENT STRENGTHS: Motivation for treatment/growth Religious Affiliation Supportive family/friends   PATIENT IDENTIFIED PROBLEMS: Schizophrenia     Depression                  DISCHARGE CRITERIA:  Improved stabilization in mood, thinking, and/or behavior Need for constant or close observation no longer present  PRELIMINARY DISCHARGE PLAN: Outpatient therapy Participate in family therapy  PATIENT/FAMILY INVOLVEMENT: This treatment plan has been presented to and reviewed with the patient, Jill Osborne, The patient and family have been given the opportunity to ask questions and make suggestions.  Trula Ore, RN 05/20/2020, 1:57 AM

## 2020-05-20 NOTE — H&P (Signed)
Psychiatric Admission Assessment Adult  Patient Identification: Jill Osborne MRN:  [id number] Date of Evaluation:  05/20/2020 Chief Complaint:  Schizoaffective disorder, depressive type (HCC) [F25.1] Principal Diagnosis: Schizoaffective disorder, depressive type (HCC) Diagnosis:  Principal Problem:   Schizoaffective disorder, depressive type (HCC)  History of Present Illness: Patient seen and chart reviewed.  19 year old woman presented voluntarily to the emergency room with worsening confusion and psychotic symptoms.  On interview today patient's chief complaint is hearing voices and seeing things.  Specifically she says that when she looks at trees they look like they have people hanging in them.  Even when she goes inside she feels like these things are "following" her.  She does not feel comfortable anywhere.  Says that she feels like she "does not fit in my body".  Clearly dysphoric and anxious.  Frightened by her experiences.  Decreased appetite and sleep.  Has not been able to discuss her specific symptoms with her family although her mother clearly noted that she was more withdrawn in her behavior.  Patient claims that she has been drinking and using marijuana daily.  Alcohol level on presentation was negative marijuana was in the drug screen.  She says she is taking medication prescribed to her at [facility] which appears to be modest dose Seroquel and Lexapro.  Denies any specific new trauma. Associated Signs/Symptoms: Depression Symptoms:  depressed mood, psychomotor retardation, difficulty concentrating, anxiety, (Hypo) Manic Symptoms:  None reported Anxiety Symptoms:  Excessive Worry, Psychotic Symptoms:  Hallucinations: Auditory Visual Paranoia, PTSD Symptoms: Does not seemPatient has been given a diagnosis of PTSD in the past although specific trauma is a little hard to pin down.  Like she is acutely having obvious PTSD symptoms although it would be in the differential  diagnosis Total Time spent with patient: 1 hour  Past Psychiatric History: Patient has had a couple of previous hospitalizations most recently at [facility] a couple months ago.  Prior to that she was hospitalized at our facility last October.  At that time she was complaining mostly of depression symptoms and did not appear to be psychotic.  Patient has had suicidal thoughts in the past but no suicide attempts no violence to others.  Substance abuse not previously identified as a problem  Is the patient at risk to self? No.  Has the patient been a risk to self in the past 6 months? No.  Has the patient been a risk to self within the distant past? No.  Is the patient a risk to others? No.  Has the patient been a risk to others in the past 6 months? No.  Has the patient been a risk to others within the distant past? No.   Prior Inpatient Therapy:   Prior Outpatient Therapy:    Alcohol Screening: 1. How often do you have a drink containing alcohol?: 2 to 4 times a month 2. How many drinks containing alcohol do you have on a typical day when you are drinking?: 3 or 4 3. How often do you have six or more drinks on one occasion?: Never AUDIT-C Score: 3 4. How often during the last year have you found that you were not able to stop drinking once you had started?: Never 5. How often during the last year have you failed to do what was normally expected from you because of drinking?: Never 6. How often during the last year have you needed a first drink in the morning to get yourself going after a heavy  drinking session?: Never 7. How often during the last year have you had a feeling of guilt of remorse after drinking?: Never 8. How often during the last year have you been unable to remember what happened the night before because you had been drinking?: Never 9. Have you or someone else been injured as a result of your drinking?: No 10. Has a relative or friend or a doctor or another health worker  been concerned about your drinking or suggested you cut down?: No Alcohol Use Disorder Identification Test Final Score (AUDIT): 3 Alcohol Brief Interventions/Follow-up: AUDIT Score <7 follow-up not indicated Substance Abuse History in the last 12 months:  Yes.   Consequences of Substance Abuse: Unclear Previous Psychotropic Medications: Yes  Psychological Evaluations: Yes  Past Medical History:  Past Medical History:  Diagnosis Date  . Asthma   . Depression   . Lumbar herniated disc   . Schizophrenia (HCC)   . Tetanus     Past Surgical History:  Procedure Laterality Date  . LAPAROSCOPIC APPENDECTOMY N/A 07/15/2016   Procedure: APPENDECTOMY LAPAROSCOPIC;  Surgeon: Ricarda Frame, MD;  Location: [facility] ORS;  Service: General;  Laterality: N/A;   Family History:  Family History  Problem Relation Age of Onset  . Healthy Mother   . Healthy Father    Family Psychiatric  History: None reported Tobacco Screening:   Social History:  Social History   Substance and Sexual Activity  Alcohol Use Yes     Social History   Substance and Sexual Activity  Drug Use Yes  . Types: Marijuana    Additional Social History:                           Allergies:   Allergies  Allergen Reactions  . Shellfish Allergy Hives  . Eggs Or Egg-Derived Products Nausea And Vomiting   Lab Results:  Results for orders placed or performed during the hospital encounter of 05/19/20 (from the past 48 hour(s))  Comprehensive metabolic panel     Status: Abnormal   Collection Time: 05/19/20  5:50 PM  Result Value Ref Range   Sodium 138 135 - 145 mmol/L   Potassium 3.9 3.5 - 5.1 mmol/L   Chloride 106 98 - 111 mmol/L   CO2 23 22 - 32 mmol/L   Glucose, Bld 94 70 - 99 mg/dL    Comment: Glucose reference range applies only to samples taken after fasting for at least 8 hours.   BUN 12 6 - 20 mg/dL   Creatinine, Ser 2.95 0.44 - 1.00 mg/dL   Calcium 9.6 8.9 - 28.4 mg/dL   Total Protein 8.3 (H) 6.5  - 8.1 g/dL   Albumin 5.0 3.5 - 5.0 g/dL   AST 16 15 - 41 U/L   ALT 9 0 - 44 U/L   Alkaline Phosphatase 57 38 - 126 U/L   Total Bilirubin 0.8 0.3 - 1.2 mg/dL   GFR calc non Af Amer >60 >60 mL/min   GFR calc Af Amer >60 >60 mL/min   Anion gap 9 5 - 15    Comment: Performed at [facility], [address] [place], [place] [address]  Ethanol     Status: None   Collection Time: 05/19/20  5:50 PM  Result Value Ref Range   Alcohol, Ethyl (B) <10 <10 mg/dL    Comment: (NOTE) Lowest detectable limit for serum alcohol is 10 mg/dL.  For medical purposes only. Performed at [facility], [address] [place], [place] [address]   Salicylate level     Status: Abnormal   Collection Time: 05/19/20  5:50 PM  Result Value Ref Range   Salicylate Lvl <7.0 (L) 7.0 - 30.0 mg/dL    Comment: Performed at [facility], [address] [place], [place] [address]  Acetaminophen level     Status: Abnormal   Collection Time: 05/19/20  5:50 PM  Result Value Ref Range   Acetaminophen (Tylenol), Serum <10 (L) 10 - 30 ug/mL    Comment: (NOTE) Therapeutic concentrations vary significantly. A range of 10-30 ug/mL  may be an effective concentration for many patients. However, some  are best treated at concentrations outside of this range. Acetaminophen concentrations >150 ug/mL at 4 hours after ingestion  and >50 ug/mL at 12 hours after ingestion are often associated with  toxic reactions.  Performed at [facility], [address] [place], [place] [address]   cbc     Status: None   Collection Time: 05/19/20  5:50 PM  Result Value Ref Range   WBC 7.8 4.0 - 10.5 K/uL   RBC 4.23 3.87 - 5.11 MIL/uL   Hemoglobin 12.9 12.0 - 15.0 g/dL   HCT 24.0 36 - 46 %   MCV 90.8 80.0 - 100.0 fL   MCH 30.5 26.0 - 34.0 pg   MCHC 33.6 30.0 - 36.0 g/dL   RDW 97.3 53.2 - 99.2 %   Platelets 271 150 - 400 K/uL   nRBC 0.0 0.0 - 0.2 %    Comment: Performed at [facility],  [address], [place], [place] [address]  SARS Coronavirus 2 by RT PCR (hospital order, performed in [facility] hospital lab) Nasopharyngeal Nasopharyngeal Swab     Status: None   Collection Time: 05/19/20 10:03 PM   Specimen: Nasopharyngeal Swab  Result Value Ref Range   SARS Coronavirus 2 NEGATIVE NEGATIVE    Comment: (NOTE) SARS-CoV-2 target nucleic acids are NOT DETECTED.  The SARS-CoV-2 RNA is generally detectable in upper and lower respiratory specimens during the acute phase of infection. The lowest concentration of SARS-CoV-2 viral copies this assay can detect is 250 copies / mL. A negative result does not preclude SARS-CoV-2 infection and should not be used as the sole basis for treatment or other patient management decisions.  A negative result may occur with improper specimen collection / handling, submission of specimen other than nasopharyngeal swab, presence of viral mutation(s) within the areas targeted by this assay, and inadequate number of viral copies (<250 copies / mL). A negative result must be combined with clinical observations, patient history, and epidemiological information.  Fact Sheet for Patients:   BoilerBrush.com.cy  Fact Sheet for Healthcare Providers: https://pope.com/  This test is not yet approved or  cleared by the [place] FDA and has been authorized for detection and/or diagnosis of SARS-CoV-2 by FDA under an Emergency Use Authorization (EUA).  This EUA will remain in effect (meaning this test can be used) for the duration of the COVID-19 declaration under Section 564(b)(1) of the Act, 21 U.S.C. section 360bbb-3(b)(1), unless the authorization is terminated or revoked sooner.  Performed at [facility], [address] [place], [place] [address]   Urine Drug Screen, Qualitative     Status: Abnormal   Collection Time: 05/19/20 10:45 PM  Result Value Ref Range   Tricyclic, Ur  Screen NONE DETECTED NONE DETECTED   Amphetamines, Ur Screen NONE DETECTED NONE DETECTED   MDMA (  Ecstasy)Ur Screen NONE DETECTED NONE DETECTED   Cocaine Metabolite,Ur [place] NONE DETECTED NONE DETECTED   Opiate, Ur Screen NONE DETECTED NONE DETECTED   Phencyclidine (PCP) Ur S NONE DETECTED NONE DETECTED   Cannabinoid 50 Ng, Ur [place] POSITIVE (A) NONE DETECTED   Barbiturates, Ur Screen NONE DETECTED NONE DETECTED   Benzodiazepine, Ur Scrn NONE DETECTED NONE DETECTED   Methadone Scn, Ur NONE DETECTED NONE DETECTED    Comment: (NOTE) Tricyclics + metabolites, urine    Cutoff 1000 ng/mL Amphetamines + metabolites, urine  Cutoff 1000 ng/mL MDMA (Ecstasy), urine              Cutoff 500 ng/mL Cocaine Metabolite, urine          Cutoff 300 ng/mL Opiate + metabolites, urine        Cutoff 300 ng/mL Phencyclidine (PCP), urine         Cutoff 25 ng/mL Cannabinoid, urine                 Cutoff 50 ng/mL Barbiturates + metabolites, urine  Cutoff 200 ng/mL Benzodiazepine, urine              Cutoff 200 ng/mL Methadone, urine                   Cutoff 300 ng/mL  The urine drug screen provides only a preliminary, unconfirmed analytical test result and should not be used for non-medical purposes. Clinical consideration and professional judgment should be applied to any positive drug screen result due to possible interfering substances. A more specific alternate chemical method must be used in order to obtain a confirmed analytical result. Gas chromatography / mass spectrometry (GC/MS) is the preferred confirm atory method. Performed at [facility], [address] [place], [place] [address]   Pregnancy, urine POC     Status: None   Collection Time: 05/19/20 10:50 PM  Result Value Ref Range   Preg Test, Ur NEGATIVE NEGATIVE    Comment:        THE SENSITIVITY OF THIS METHODOLOGY IS >24 mIU/mL     Blood Alcohol level:  Lab Results  Component Value Date   ETH <10 05/19/2020   ETH <10  01/22/2020    Metabolic Disorder Labs:  No results found for: HGBA1C, MPG No results found for: PROLACTIN No results found for: CHOL, TRIG, HDL, CHOLHDL, VLDL, LDLCALC  Current Medications: Current Facility-Administered Medications  Medication Dose Route Frequency Provider Last Rate Last Admin  . acetaminophen (TYLENOL) tablet 650 mg  650 mg Oral Q6H PRN Mort Smelser, Jackquline Denmark, MD   650 mg at 05/20/20 0644  . albuterol (PROVENTIL) (2.5 MG/3ML) 0.083% nebulizer solution 2.5 mg  2.5 mg Nebulization Q6H PRN Elenie Coven T, MD      . albuterol (VENTOLIN HFA) 108 (90 Base) MCG/ACT inhaler 2 puff  2 puff Inhalation Q4H PRN Raine Elsass T, MD      . alum & mag hydroxide-simeth (MAALOX/MYLANTA) 200-200-20 MG/5ML suspension 30 mL  30 mL Oral Q4H PRN Caliber Landess T, MD      . escitalopram (LEXAPRO) tablet 10 mg  10 mg Oral Daily Daanya Lanphier T, MD      . levonorgestrel (KYLEENA) 19.5 MG IUD 1 each  1 each Intrauterine Continuous Usman Millett T, MD      . LORazepam (ATIVAN) tablet 1 mg  1 mg Oral Q4H PRN Durelle Zepeda T, MD      . magnesium hydroxide (MILK OF MAGNESIA) suspension 30 mL  30 mL Oral Daily PRN Tyreak Reagle T, MD      . QUEtiapine (SEROQUEL) tablet 100 mg  100 mg Oral QHS Teryl Gubler, Jackquline DenmarkJohn T, MD       PTA Medications: Facility-Administered Medications Prior to Admission  Medication Dose Route Frequency Provider Last Rate Last Admin  . Levonorgestrel IUD 19.5 mg  1 each Intrauterine Continuous Reva BoresPratt, Tanya S, MD   19.5 mg at 04/26/18 1030   Medications Prior to Admission  Medication Sig Dispense Refill Last Dose  . albuterol (PROVENTIL) (2.5 MG/3ML) 0.083% nebulizer solution Take 3 mLs (2.5 mg total) by nebulization every 6 (six) hours as needed for wheezing or shortness of breath. (Patient not taking: Reported on 05/19/2020) 75 mL 1   . albuterol (VENTOLIN HFA) 108 (90 Base) MCG/ACT inhaler Inhale 2 puffs into the lungs every 4 (four) hours as needed for wheezing or shortness of breath.  (Patient not taking: Reported on 05/19/2020) 8 g 1   . escitalopram (LEXAPRO) 10 MG tablet Take 10 mg by mouth daily.     . QUEtiapine (SEROQUEL) 25 MG tablet Take 75 mg by mouth at bedtime.     Marland Kitchen. venlafaxine XR (EFFEXOR-XR) 37.5 MG 24 hr capsule TAKE 1 CAPSULE BY MOUTH DAILY WITH BREAKFAST. (Patient not taking: Reported on 05/19/2020) 30 capsule 0     Musculoskeletal: Strength & Muscle Tone: within normal limits Gait & Station: normal Patient leans: N/A  Psychiatric Specialty Exam: Physical Exam Nursing note reviewed.  Constitutional:      Appearance: She is well-developed.  HENT:     Head: Normocephalic and atraumatic.  Eyes:     Conjunctiva/sclera: Conjunctivae normal.     Pupils: Pupils are equal, round, and reactive to light.  Cardiovascular:     Heart sounds: Normal heart sounds.  Pulmonary:     Effort: Pulmonary effort is normal.  Abdominal:     Palpations: Abdomen is soft.  Musculoskeletal:        General: Normal range of motion.     Cervical back: Normal range of motion.  Skin:    General: Skin is warm and dry.  Neurological:     General: No focal deficit present.     Mental Status: She is alert.  Psychiatric:        Attention and Perception: She is inattentive. She perceives auditory and visual hallucinations.        Mood and Affect: Mood is anxious. Affect is blunt.        Speech: Speech is delayed.        Behavior: Behavior is not agitated or aggressive.        Thought Content: Thought content is paranoid and delusional. Thought content does not include homicidal or suicidal ideation.        Cognition and Memory: Cognition is impaired.        Judgment: Judgment normal.     Review of Systems  Constitutional: Negative.   HENT: Negative.   Eyes: Negative.   Respiratory: Negative.   Cardiovascular: Negative.   Gastrointestinal: Negative.   Musculoskeletal: Negative.   Skin: Negative.   Neurological: Negative.   Psychiatric/Behavioral: Positive for confusion,  decreased concentration, dysphoric mood and hallucinations. The patient is nervous/anxious.     Blood pressure (!) 109/98, pulse 94, temperature 98.5 F (36.9 C), temperature source Oral, resp. rate 18, height 5' (1.524 m), weight 126 kg, SpO2 100 %.Body mass index is 54.25 kg/m.  General Appearance: Fairly Groomed  Eye Contact:  Fair  Speech:  Slow  Volume:  Decreased  Mood:  Anxious and Dysphoric  Affect:  Depressed and Tearful  Thought Process:  Coherent  Orientation:  Full (Time, Place, and Person)  Thought Content:  Hallucinations: Auditory Visual  Suicidal Thoughts:  No  Homicidal Thoughts:  No  Memory:  Immediate;   Fair Recent;   Fair Remote;   Fair  Judgement:  Fair  Insight:  Fair  Psychomotor Activity:  Decreased  Concentration:  Concentration: Fair  Recall:  [employer] of Knowledge:  Fair  Language:  Fair  Akathisia:  No  Handed:  Right  AIMS (if indicated):     Assets:  Desire for Improvement Financial Resources/Insurance Housing Physical Health Resilience  ADL's:  Impaired  Cognition:  Impaired,  Mild  Sleep:  Number of Hours: 1.15    Treatment Plan Summary: Daily contact with patient to assess and evaluate symptoms and progress in treatment, Medication management and Plan Patient symptoms currently appear to be more consistent with schizophrenia or schizoaffective disorder than depression even with psychotic depression.  Given her young age and the progression of her illness I think it is quite likely that we are seeing an early schizophrenic presentation.  PTSD had been considered in the past but her current symptoms appear to be beyond what would be expected from that.  I recommend to her that we start to increase her dose of Seroquel.  According to the referral notes her most recent dose was only 75 mg at night.  We will go to 100 and then try to get that titrated up to an antipsychotic dose.  Continue the 10 mg Lexapro.  Engage in individual and group  assessment and therapy.  Observation Level/Precautions:  15 minute checks  Laboratory:  Chemistry Profile  Psychotherapy:    Medications:    Consultations:    Discharge Concerns:    Estimated LOS:  Other:     Physician Treatment Plan for Primary Diagnosis: Schizoaffective disorder, depressive type (HCC) Long Term Goal(s): Improvement in symptoms so as ready for discharge  Short Term Goals: Ability to verbalize feelings will improve and Ability to demonstrate self-control will improve  Physician Treatment Plan for Secondary Diagnosis: Principal Problem:   Schizoaffective disorder, depressive type (HCC)  Long Term Goal(s): Improvement in symptoms so as ready for discharge  Short Term Goals: Ability to maintain clinical measurements within normal limits will improve and Compliance with prescribed medications will improve  I certify that inpatient services furnished can reasonably be expected to improve the patient's condition.    Mordecai Rasmussen, MD 8/4/20213:58 PM

## 2020-05-20 NOTE — Plan of Care (Signed)
Pt denies anxiety, depression, SI, HI and AVH. PT was educated on care plan and verbalizes understanding. Pt was encouraged to attend groups. Torrie Mayers RN Problem: Activity: Goal: Will verbalize the importance of balancing activity with adequate rest periods Outcome: Progressing   Problem: Education: Goal: Will be free of psychotic symptoms Outcome: Not Progressing Goal: Knowledge of the prescribed therapeutic regimen will improve Outcome: Progressing   Problem: Coping: Goal: Coping ability will improve Outcome: Progressing Goal: Will verbalize feelings Outcome: Progressing   Problem: Health Behavior/Discharge Planning: Goal: Compliance with prescribed medication regimen will improve Outcome: Progressing   Problem: Nutritional: Goal: Ability to achieve adequate nutritional intake will improve Outcome: Progressing   Problem: Role Relationship: Goal: Ability to communicate needs accurately will improve Outcome: Progressing Goal: Ability to interact with others will improve Outcome: Progressing   Problem: Safety: Goal: Ability to redirect hostility and anger into socially appropriate behaviors will improve Outcome: Progressing Goal: Ability to remain free from injury will improve Outcome: Progressing   Problem: Self-Care: Goal: Ability to participate in self-care as condition permits will improve Outcome: Progressing   Problem: Self-Concept: Goal: Will verbalize positive feelings about self Description: Patient currently denies SI/HI/AVH affect is flat but she brightens upon approach  Outcome: Progressing   Problem: Consults Goal: Concurrent Medical Patient Education Description: (See Patient Education Module for education specifics) Outcome: Progressing   Problem: BHH Concurrent Medical Problem Goal: LTG-Pt will be physically stable and he/significant other Description: (Patient will be physically stable and he/significant other will be able to verbalize  understanding of follow-up care and symptoms that would warrant further treatment) Outcome: Progressing Goal: STG-Vital signs will be within defined limits or stabilized Description: (STG- Vital signs will be within defined limits or stabilized for individual) Outcome: Progressing Goal: STG-Compliance with medication and/or treatment as ordered Description: (STG-Compliance with medication and/or treatment as ordered by MD) Outcome: Progressing Goal: STG-Verbalize two symptoms that would warrant further Description: (STG-Verbalize two symptoms that would warrant further treatment) Outcome: Progressing Goal: STG-Patient will participate in management/stabilization Description: (STG-Patient will participate in management/stabilization of medical condition) Outcome: Progressing Goal: STG-Other (Specify): Description: STG-Other Concurrent Medical (Specify): Outcome: Progressing   Problem: Education: Goal: Utilization of techniques to improve thought processes will improve Outcome: Progressing Goal: Knowledge of the prescribed therapeutic regimen will improve Outcome: Progressing   Problem: Activity: Goal: Interest or engagement in leisure activities will improve Outcome: Progressing Goal: Imbalance in normal sleep/wake cycle will improve Outcome: Progressing   Problem: Coping: Goal: Coping ability will improve Outcome: Progressing Goal: Will verbalize feelings Outcome: Progressing   Problem: Health Behavior/Discharge Planning: Goal: Ability to make decisions will improve Outcome: Progressing Goal: Compliance with therapeutic regimen will improve Outcome: Progressing   Problem: Role Relationship: Goal: Will demonstrate positive changes in social behaviors and relationships Outcome: Progressing   Problem: Safety: Goal: Ability to disclose and discuss suicidal ideas will improve Outcome: Progressing Goal: Ability to identify and utilize support systems that promote safety  will improve Outcome: Progressing   Problem: Self-Concept: Goal: Will verbalize positive feelings about self Outcome: Progressing Goal: Level of anxiety will decrease Outcome: Progressing

## 2020-05-20 NOTE — Progress Notes (Signed)
Recreation Therapy Notes  INPATIENT RECREATION THERAPY ASSESSMENT  Patient Details Name: Jill Osborne MRN: [id number] DOB: 30-Apr-2001 Today's Date: 05/20/2020       Information Obtained From: Patient  Able to Participate in Assessment/Interview: Yes  Patient Presentation: Responsive  Reason for Admission (Per Patient): Active Symptoms  Patient Stressors:    Coping Skills:   Music, TV  Leisure Interests (2+):  Art - Paint, Health visitor, Music - Listen  Frequency of Recreation/Participation: Chief Executive Officer of Community Resources:  Yes  Community Resources:  Park  Current Use:    If no, Barriers?:    Expressed Interest in [employer] Information:    [place] of Residence:  [place]  Patient Main Form of Transportation: Set designer  Patient Strengths:  Optimistic  Patient Identified Areas of Improvement:  Accept myself more  Patient Goal for Hospitalization:  Get over fear of going outside  Current SI (including self-harm):  No  Current HI:  No  Current AVH: No  Staff Intervention Plan: Group Attendance, Collaborate with Interdisciplinary Treatment Team  Consent to Intern Participation: N/A  Ishaq Maffei 05/20/2020, 3:20 PM

## 2020-05-20 NOTE — BH Assessment (Signed)
Patient is to be admitted to [facility] by Dr. Nanine Means.  Attending Physician will be Dr. Toni Amend.   Patient has been assigned to room 316, by [facility] Charge Nurse [place].   Intake Paper Work has been signed and placed on patient chart.  ER staff is aware of the admission:  Bonita Quin, ER Secretary    Dr. Lenard Lance, ER MD   [facility] Patient's Nurse   Lorene Dy, Patient Access.

## 2020-05-20 NOTE — Progress Notes (Signed)
Admission Note:  19 yr Female who presents Voluntary  in no acute distress for the treatment of Psychosis and Depression,  Patient appears sad and withdrawn she was offered emotional support and she was calm and cooperative with admission process. Patient denies SI/HI/AVH  upon admission, her thoughts are organized and coherent,makes brief eye contact no distress noted.     Patient has Past medical Hx of Asthma, Depression, Lumbar herniated disc, Schizophrenia, and tetanus. Patient's skin was assessed and is warm and dry no abnormal marks also searched and no contraband found, POC , unit policies explained and understanding verbalized and consents obtained, 15 minutes safety checks maintained, will continue to monitor.

## 2020-05-21 DIAGNOSIS — F251 Schizoaffective disorder, depressive type: Secondary | ICD-10-CM | POA: Diagnosis not present

## 2020-05-21 LAB — LIPID PANEL
Cholesterol: 190 mg/dL (ref 0–200)
HDL: 42 mg/dL (ref >40)
LDL Cholesterol: 128 mg/dL — ABNORMAL HIGH (ref 0–99)
Total CHOL/HDL Ratio: 4.5 RATIO
Triglycerides: 99 mg/dL (ref <150)
VLDL: 20 mg/dL (ref 0–40)

## 2020-05-21 LAB — TSH: TSH: 0.821 u[IU]/mL (ref 0.350–4.500)

## 2020-05-21 MED ORDER — QUETIAPINE FUMARATE 200 MG PO TABS
200.0000 mg | ORAL_TABLET | Freq: Every day | ORAL | Status: DC
  Administered 2020-05-21: 200 mg via ORAL
  Filled 2020-05-21: qty 1

## 2020-05-21 NOTE — Tx Team (Addendum)
Interdisciplinary Treatment and Diagnostic Plan Update  05/21/2020 Time of Session: 9:00AM Jill Osborne MRN: [id number]  Principal Diagnosis: Schizoaffective disorder, depressive type (HCC)  Secondary Diagnoses: Principal Problem:   Schizoaffective disorder, depressive type (HCC)   Current Medications:  Current Facility-Administered Medications  Medication Dose Route Frequency Provider Last Rate Last Admin   acetaminophen (TYLENOL) tablet 650 mg  650 mg Oral Q6H PRN Clapacs, Jackquline Denmark, MD   650 mg at 05/21/20 0835   albuterol (PROVENTIL) (2.5 MG/3ML) 0.083% nebulizer solution 2.5 mg  2.5 mg Nebulization Q6H PRN Clapacs, Jackquline Denmark, MD       albuterol (VENTOLIN HFA) 108 (90 Base) MCG/ACT inhaler 2 puff  2 puff Inhalation Q4H PRN Clapacs, Jackquline Denmark, MD       alum & mag hydroxide-simeth (MAALOX/MYLANTA) 200-200-20 MG/5ML suspension 30 mL  30 mL Oral Q4H PRN Clapacs, Jackquline Denmark, MD       escitalopram (LEXAPRO) tablet 10 mg  10 mg Oral Daily Clapacs, Jackquline Denmark, MD   10 mg at 05/21/20 5462   levonorgestrel (KYLEENA) 19.5 MG IUD 1 each  1 each Intrauterine Continuous Clapacs, Jackquline Denmark, MD       LORazepam (ATIVAN) tablet 1 mg  1 mg Oral Q4H PRN Clapacs, Jackquline Denmark, MD   1 mg at 05/21/20 7035   magnesium hydroxide (MILK OF MAGNESIA) suspension 30 mL  30 mL Oral Daily PRN Clapacs, Jackquline Denmark, MD       QUEtiapine (SEROQUEL) tablet 100 mg  100 mg Oral QHS Clapacs, John T, MD   100 mg at 05/20/20 2111   PTA Medications: Facility-Administered Medications Prior to Admission  Medication Dose Route Frequency Provider Last Rate Last Admin   Levonorgestrel IUD 19.5 mg  1 each Intrauterine Continuous Reva Bores, MD   19.5 mg at 04/26/18 1030   Medications Prior to Admission  Medication Sig Dispense Refill Last Dose   albuterol (PROVENTIL) (2.5 MG/3ML) 0.083% nebulizer solution Take 3 mLs (2.5 mg total) by nebulization every 6 (six) hours as needed for wheezing or shortness of breath. (Patient not taking: Reported  on 05/19/2020) 75 mL 1    albuterol (VENTOLIN HFA) 108 (90 Base) MCG/ACT inhaler Inhale 2 puffs into the lungs every 4 (four) hours as needed for wheezing or shortness of breath. (Patient not taking: Reported on 05/19/2020) 8 g 1    escitalopram (LEXAPRO) 10 MG tablet Take 10 mg by mouth daily.      QUEtiapine (SEROQUEL) 25 MG tablet Take 75 mg by mouth at bedtime.      venlafaxine XR (EFFEXOR-XR) 37.5 MG 24 hr capsule TAKE 1 CAPSULE BY MOUTH DAILY WITH BREAKFAST. (Patient not taking: Reported on 05/19/2020) 30 capsule 0     Patient Stressors: Health problems Medication change or noncompliance  Patient Strengths: Motivation for treatment/growth Religious Affiliation Supportive family/friends  Treatment Modalities: Medication Management, Group therapy, Case management,  1 to 1 session with clinician, Psychoeducation, Recreational therapy.   Physician Treatment Plan for Primary Diagnosis: Schizoaffective disorder, depressive type (HCC) Long Term Goal(s): Improvement in symptoms so as ready for discharge Improvement in symptoms so as ready for discharge   Short Term Goals: Ability to verbalize feelings will improve Ability to demonstrate self-control will improve Ability to maintain clinical measurements within normal limits will improve Compliance with prescribed medications will improve  Medication Management: Evaluate patient's response, side effects, and tolerance of medication regimen.  Therapeutic Interventions: 1 to 1 sessions, Unit Group sessions and Medication administration.  Evaluation of Outcomes:  Progressing  Physician Treatment Plan for Secondary Diagnosis: Principal Problem:   Schizoaffective disorder, depressive type (HCC)  Long Term Goal(s): Improvement in symptoms so as ready for discharge Improvement in symptoms so as ready for discharge   Short Term Goals: Ability to verbalize feelings will improve Ability to demonstrate self-control will improve Ability to  maintain clinical measurements within normal limits will improve Compliance with prescribed medications will improve     Medication Management: Evaluate patient's response, side effects, and tolerance of medication regimen.  Therapeutic Interventions: 1 to 1 sessions, Unit Group sessions and Medication administration.  Evaluation of Outcomes: Progressing   RN Treatment Plan for Primary Diagnosis: Schizoaffective disorder, depressive type (HCC) Long Term Goal(s): Knowledge of disease and therapeutic regimen to maintain health will improve  Short Term Goals: Ability to demonstrate self-control, Ability to participate in decision making will improve, Ability to verbalize feelings will improve, Ability to identify and develop effective coping behaviors will improve and Compliance with prescribed medications will improve  Medication Management: RN will administer medications as ordered by provider, will assess and evaluate patient's response and provide education to patient for prescribed medication. RN will report any adverse and/or side effects to prescribing provider.  Therapeutic Interventions: 1 on 1 counseling sessions, Psychoeducation, Medication administration, Evaluate responses to treatment, Monitor vital signs and CBGs as ordered, Perform/monitor CIWA, COWS, AIMS and Fall Risk screenings as ordered, Perform wound care treatments as ordered.  Evaluation of Outcomes: Progressing   LCSW Treatment Plan for Primary Diagnosis: Schizoaffective disorder, depressive type (HCC) Long Term Goal(s): Safe transition to appropriate next level of care at discharge, Engage patient in therapeutic group addressing interpersonal concerns.  Short Term Goals: Engage patient in aftercare planning with referrals and resources, Increase social support, Increase ability to appropriately verbalize feelings, Increase emotional regulation, Facilitate acceptance of mental health diagnosis and concerns and Increase  skills for wellness and recovery  Therapeutic Interventions: Assess for all discharge needs, 1 to 1 time with Social worker, Explore available resources and support systems, Assess for adequacy in community support network, Educate family and significant other(s) on suicide prevention, Complete Psychosocial Assessment, Interpersonal group therapy.  Evaluation of Outcomes: Progressing   Progress in Treatment: Attending groups: Yes. Participating in groups: Yes. Taking medication as prescribed: Yes. Toleration medication: Yes. Family/Significant other contact made: Yes, individual(s) contacted:  once permission is given. Patient understands diagnosis: Yes. Discussing patient identified problems/goals with staff: Yes. Medical problems stabilized or resolved: Yes. Denies suicidal/homicidal ideation: Yes. Issues/concerns per patient self-inventory: No. Other: none  New problem(s) identified: No, Describe:  none  New Short Term/Long Term Goal(s): elimination of symptoms of psychosis, medication management for mood stabilization; elimination of SI thoughts; development of comprehensive mental wellness plan.  Patient Goals:  "feel less anxious"  Discharge Plan or Barriers:  Patient reports plans to return to her home.  She reports that she would like to continue with her current aftercare provider at this time.  Reason for Continuation of Hospitalization: Anxiety Delusions  Depression Hallucinations Medication stabilization  Estimated Length of Stay:  1-7 days   Recreational Therapy: Patient: N/A Patient Goal: Patient will engage in groups without prompting or encouragement from LRT x3 group sessions within 5 recreation therapy group sessions  Attendees: Patient: Jill Osborne 05/21/2020 11:32 AM  Physician: Dr. Toni Amend, MD 05/21/2020 11:32 AM  Nursing: Cecille Amsterdam, RN 05/21/2020 11:32 AM  RN Care Manager: 05/21/2020 11:32 AM  Social Worker: Penni Homans, LCSW 05/21/2020 11:32  AM  Recreational Therapist: Garret Reddish, CTRS,  LRT 05/21/2020 11:32 AM  Other:  05/21/2020 11:32 AM  Other:  05/21/2020 11:32 AM  Other: 05/21/2020 11:32 AM    Scribe for Treatment Team: Harden Mo, LCSW 05/21/2020 11:32 AM

## 2020-05-21 NOTE — Plan of Care (Signed)
D- Patient alert and oriented. Patient presents in an anxious, but pleasant mood on assessment stating that she slept good last night and had complaints of upper back pain. Patient rated her pain a "6/10", in which she requested pain medication from this Clinical research associate. Patient denies depression, but endorsed anxiety, however, she stated "I really couldn't tell you", when asked what's making her feel this way. Patient was given PRN medication for anxiety, and has been resting ever since. Patient also denies SI, HI, AVH at this time. Patient had no stated goals for today.  A- Scheduled medications administered to patient, per MD orders. Support and encouragement provided.  Routine safety checks conducted every 15 minutes.  Patient informed to notify staff with problems or concerns.  R- No adverse drug reactions noted. Patient contracts for safety at this time. Patient compliant with medications and treatment plan. Patient receptive, calm, and cooperative. Patient interacts well with others on the unit.  Patient remains safe at this time.  Problem: Activity: Goal: Will verbalize the importance of balancing activity with adequate rest periods Outcome: Progressing   Problem: Education: Goal: Will be free of psychotic symptoms Outcome: Progressing Goal: Knowledge of the prescribed therapeutic regimen will improve Outcome: Progressing   Problem: Coping: Goal: Coping ability will improve Outcome: Progressing Goal: Will verbalize feelings Outcome: Progressing   Problem: Health Behavior/Discharge Planning: Goal: Compliance with prescribed medication regimen will improve Outcome: Progressing   Problem: Nutritional: Goal: Ability to achieve adequate nutritional intake will improve Outcome: Progressing   Problem: Role Relationship: Goal: Ability to communicate needs accurately will improve Outcome: Progressing Goal: Ability to interact with others will improve Outcome: Progressing   Problem:  Safety: Goal: Ability to redirect hostility and anger into socially appropriate behaviors will improve Outcome: Progressing Goal: Ability to remain free from injury will improve Outcome: Progressing   Problem: Self-Care: Goal: Ability to participate in self-care as condition permits will improve Outcome: Progressing   Problem: Self-Concept: Goal: Will verbalize positive feelings about self Description: Patient currently denies SI/HI/AVH affect is flat but she brightens upon approach  Outcome: Progressing   Problem: Consults Goal: Concurrent Medical Patient Education Description: (See Patient Education Module for education specifics) Outcome: Progressing   Problem: BHH Concurrent Medical Problem Goal: LTG-Pt will be physically stable and he/significant other Description: (Patient will be physically stable and he/significant other will be able to verbalize understanding of follow-up care and symptoms that would warrant further treatment) Outcome: Progressing Goal: STG-Vital signs will be within defined limits or stabilized Description: (STG- Vital signs will be within defined limits or stabilized for individual) Outcome: Progressing Goal: STG-Compliance with medication and/or treatment as ordered Description: (STG-Compliance with medication and/or treatment as ordered by MD) Outcome: Progressing Goal: STG-Verbalize two symptoms that would warrant further Description: (STG-Verbalize two symptoms that would warrant further treatment) Outcome: Progressing Goal: STG-Patient will participate in management/stabilization Description: (STG-Patient will participate in management/stabilization of medical condition) Outcome: Progressing Goal: STG-Other (Specify): Description: STG-Other Concurrent Medical (Specify): Outcome: Progressing   Problem: Education: Goal: Utilization of techniques to improve thought processes will improve Outcome: Progressing Goal: Knowledge of the prescribed  therapeutic regimen will improve Outcome: Progressing   Problem: Activity: Goal: Interest or engagement in leisure activities will improve Outcome: Progressing Goal: Imbalance in normal sleep/wake cycle will improve Outcome: Progressing   Problem: Coping: Goal: Coping ability will improve Outcome: Progressing Goal: Will verbalize feelings Outcome: Progressing   Problem: Health Behavior/Discharge Planning: Goal: Ability to make decisions will improve Outcome: Progressing Goal: Compliance with therapeutic regimen  will improve Outcome: Progressing   Problem: Role Relationship: Goal: Will demonstrate positive changes in social behaviors and relationships Outcome: Progressing   Problem: Safety: Goal: Ability to disclose and discuss suicidal ideas will improve Outcome: Progressing Goal: Ability to identify and utilize support systems that promote safety will improve Outcome: Progressing   Problem: Self-Concept: Goal: Will verbalize positive feelings about self Outcome: Progressing Goal: Level of anxiety will decrease Outcome: Progressing

## 2020-05-21 NOTE — Progress Notes (Signed)
Recreation Therapy Notes   Date: 05/21/2020  Time: 9:30 am   Location: Court yard   Behavioral response: N/A   Intervention Topic: Leisure   Discussion/Intervention: Patient did not attend group.   Clinical Observations/Feedback:  Patient did not attend group.   Feleshia Zundel LRT/CTRS        Jerilynn Feldmeier 05/21/2020 11:29 AM

## 2020-05-21 NOTE — Progress Notes (Signed)
[facility] MD Progress Note  05/21/2020 2:02 PM Jill Osborne  MRN:  [id number] Subjective: Patient seen chart reviewed.  Patient met with treatment team.  I spoke with the patient's mother.  Patient continues to endorse hallucinations delusions paranoia and disorganized thinking.  She has not been acting out or aggressive.  She has been compliant with medicine.  Appears to be tolerating medication although sedated today. Principal Problem: Schizoaffective disorder, depressive type ([place]) Diagnosis: Principal Problem:   Schizoaffective disorder, depressive type ([place])  Total Time spent with patient: 30 minutes  Past Psychiatric History: History of gradually developing psychotic symptoms over the last 1 to 2 years  Past Medical History:  Past Medical History:  Diagnosis Date  . Asthma   . Depression   . Lumbar herniated disc   . Schizophrenia ([place])   . Tetanus     Past Surgical History:  Procedure Laterality Date  . LAPAROSCOPIC APPENDECTOMY N/A 07/15/2016   Procedure: APPENDECTOMY LAPAROSCOPIC;  Surgeon: Clayburn Pert, MD;  Location: [facility] ORS;  Service: General;  Laterality: N/A;   Family History:  Family History  Problem Relation Age of Onset  . Healthy Mother   . Healthy Father    Family Psychiatric  History: See previous Social History:  Social History   Substance and Sexual Activity  Alcohol Use Yes     Social History   Substance and Sexual Activity  Drug Use Yes  . Types: Marijuana    Social History   Socioeconomic History  . Marital status: Single    Spouse name: Not on file  . Number of children: Not on file  . Years of education: Not on file  . Highest education level: Not on file  Occupational History  . Not on file  Tobacco Use  . Smoking status: Never Smoker  . Smokeless tobacco: Never Used  Vaping Use  . Vaping Use: Never used  Substance and Sexual Activity  . Alcohol use: Yes  . Drug use: Yes    Types: Marijuana  . Sexual activity: Yes     Partners: Male    Birth control/protection: None, Condom  Other Topics Concern  . Not on file  Social History Narrative   Single.   Student at [employer], [employer].   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   Enjoys reading, spending time with friends.   Social Determinants of Health   Financial Resource Strain:   . Difficulty of Paying Living Expenses:   Food Insecurity:   . Worried About Charity fundraiser in the Last Year:   . Arboriculturist in the Last Year:   Transportation Needs:   . Film/video editor (Medical):   Marland Kitchen Lack of Transportation (Non-Medical):   Physical Activity:   . Days of Exercise per Week:   . Minutes of Exercise per Session:   Stress:   . Feeling of Stress :   Social Connections:   . Frequency of Communication with Friends and Family:   . Frequency of Social Gatherings with Friends and Family:   . Attends Religious Services:   . Active Member of Clubs or Organizations:   . Attends Archivist Meetings:   Marland Kitchen Marital Status:    Additional Social History:                         Sleep: Fair  Appetite:  Fair  Current Medications: Current Facility-Administered Medications  Medication Dose Route Frequency Provider Last Rate Last  Admin  . acetaminophen (TYLENOL) tablet 650 mg  650 mg Oral Q6H PRN Dalphine Cowie, Madie Reno, MD   650 mg at 05/21/20 0835  . albuterol (PROVENTIL) (2.5 MG/3ML) 0.083% nebulizer solution 2.5 mg  2.5 mg Nebulization Q6H PRN Tyrann Donaho T, MD      . albuterol (VENTOLIN HFA) 108 (90 Base) MCG/ACT inhaler 2 puff  2 puff Inhalation Q4H PRN Sven Pinheiro T, MD      . alum & mag hydroxide-simeth (MAALOX/MYLANTA) 200-200-20 MG/5ML suspension 30 mL  30 mL Oral Q4H PRN Cornelious Diven T, MD      . escitalopram (LEXAPRO) tablet 10 mg  10 mg Oral Daily Raelle Chambers, Madie Reno, MD   10 mg at 05/21/20 0835  . levonorgestrel (KYLEENA) 19.5 MG IUD 1 each  1 each Intrauterine Continuous Hailee Hollick T, MD      . LORazepam (ATIVAN) tablet 1 mg  1 mg  Oral Q4H PRN Derwood Becraft, Madie Reno, MD   1 mg at 05/21/20 0835  . magnesium hydroxide (MILK OF MAGNESIA) suspension 30 mL  30 mL Oral Daily PRN Giavanni Zeitlin T, MD      . QUEtiapine (SEROQUEL) tablet 200 mg  200 mg Oral QHS Shenique Childers, Madie Reno, MD        Lab Results:  Results for orders placed or performed during the hospital encounter of 05/19/20 (from the past 48 hour(s))  Comprehensive metabolic panel     Status: Abnormal   Collection Time: 05/19/20  5:50 PM  Result Value Ref [place]   Sodium 138 135 - 145 mmol/L   Potassium 3.9 3.5 - 5.1 mmol/L   Chloride 106 98 - 111 mmol/L   CO2 23 22 - 32 mmol/L   Glucose, Bld 94 70 - 99 mg/dL    Comment: Glucose reference range applies only to samples taken after fasting for at least 8 hours.   BUN 12 6 - 20 mg/dL   Creatinine, Ser 0.72 0.44 - 1.00 mg/dL   Calcium 9.6 8.9 - 10.3 mg/dL   Total Protein 8.3 (H) 6.5 - 8.1 g/dL   Albumin 5.0 3.5 - 5.0 g/dL   AST 16 15 - 41 U/L   ALT 9 0 - 44 U/L   Alkaline Phosphatase 57 38 - 126 U/L   Total Bilirubin 0.8 0.3 - 1.2 mg/dL   GFR calc non Af Amer >60 >60 mL/min   GFR calc Af Amer >60 >60 mL/min   Anion gap 9 5 - 15    Comment: Performed at [facility], [address] [place], [place] [address]  Ethanol     Status: None   Collection Time: 05/19/20  5:50 PM  Result Value Ref [place]   Alcohol, Ethyl (B) <10 <10 mg/dL    Comment: (NOTE) Lowest detectable limit for serum alcohol is 10 mg/dL.  For medical purposes only. Performed at [facility], [address] [place], [place] [address]   Salicylate level     Status: Abnormal   Collection Time: 05/19/20  5:50 PM  Result Value Ref [place]   Salicylate Lvl <0.0 (L) 7.0 - 30.0 mg/dL    Comment: Performed at [facility], [address] [place], [place] [address]  Acetaminophen level     Status: Abnormal   Collection Time: 05/19/20  5:50 PM  Result Value Ref [place]   Acetaminophen (Tylenol), Serum <10 (L) 10 - 30 ug/mL     Comment: (NOTE) Therapeutic concentrations vary significantly. A range of 10-30 ug/mL  may be an effective concentration for many patients. However, some  are best treated at concentrations outside of this range. Acetaminophen concentrations >150 ug/mL at 4 hours after ingestion  and >50 ug/mL at 12 hours after ingestion are often associated with  toxic reactions.  Performed at [facility], [address] [place], [place] [address]   cbc     Status: None   Collection Time: 05/19/20  5:50 PM  Result Value Ref [place]   WBC 7.8 4.0 - 10.5 K/uL   RBC 4.23 3.87 - 5.11 MIL/uL   Hemoglobin 12.9 12.0 - 15.0 g/dL   HCT 38.4 36 - 46 %   MCV 90.8 80.0 - 100.0 fL   MCH 30.5 26.0 - 34.0 pg   MCHC 33.6 30.0 - 36.0 g/dL   RDW 12.7 11.5 - 15.5 %   Platelets 271 150 - 400 K/uL   nRBC 0.0 0.0 - 0.2 %    Comment: Performed at [facility], [address] [place], [place] [address]  SARS Coronavirus 2 by RT PCR (hospital order, performed in [facility] hospital lab) Nasopharyngeal Nasopharyngeal Swab     Status: None   Collection Time: 05/19/20 10:03 PM   Specimen: Nasopharyngeal Swab  Result Value Ref [place]   SARS Coronavirus 2 NEGATIVE NEGATIVE    Comment: (NOTE) SARS-CoV-2 target nucleic acids are NOT DETECTED.  The SARS-CoV-2 RNA is generally detectable in upper and lower respiratory specimens during the acute phase of infection. The lowest concentration of SARS-CoV-2 viral copies this assay can detect is 250 copies / mL. A negative result does not preclude SARS-CoV-2 infection and should not be used as the sole basis for treatment or other patient management decisions.  A negative result may occur with improper specimen collection / handling, submission of specimen other than nasopharyngeal swab, presence of viral mutation(s) within the areas targeted by this assay, and inadequate number of viral copies (<250 copies / mL). A negative result must be combined with  clinical observations, patient history, and epidemiological information.  Fact Sheet for Patients:   StrictlyIdeas.no  Fact Sheet for Healthcare Providers: BankingDealers.co.za  This test is not yet approved or  cleared by the [place] FDA and has been authorized for detection and/or diagnosis of SARS-CoV-2 by FDA under an Emergency Use Authorization (EUA).  This EUA will remain in effect (meaning this test can be used) for the duration of the COVID-19 declaration under Section 564(b)(1) of the Act, 21 U.S.C. section 360bbb-3(b)(1), unless the authorization is terminated or revoked sooner.  Performed at [facility], [address] [place], [place] [address]   Urine Drug Screen, Qualitative     Status: Abnormal   Collection Time: 05/19/20 10:45 PM  Result Value Ref [place]   Tricyclic, Ur Screen NONE DETECTED NONE DETECTED   Amphetamines, Ur Screen NONE DETECTED NONE DETECTED   MDMA (Ecstasy)Ur Screen NONE DETECTED NONE DETECTED   Cocaine Metabolite,Ur [place] NONE DETECTED NONE DETECTED   Opiate, Ur Screen NONE DETECTED NONE DETECTED   Phencyclidine (PCP) Ur S NONE DETECTED NONE DETECTED   Cannabinoid 50 Ng, Ur  POSITIVE (A) NONE DETECTED   Barbiturates, Ur Screen NONE DETECTED NONE DETECTED   Benzodiazepine, Ur Scrn NONE DETECTED NONE DETECTED   Methadone Scn, Ur NONE DETECTED NONE DETECTED    Comment: (NOTE) Tricyclics + metabolites, urine    Cutoff 1000 ng/mL Amphetamines + metabolites, urine  Cutoff 1000 ng/mL MDMA (Ecstasy), urine  Cutoff 500 ng/mL Cocaine Metabolite, urine          Cutoff 300 ng/mL Opiate + metabolites, urine        Cutoff 300 ng/mL Phencyclidine (PCP), urine         Cutoff 25 ng/mL Cannabinoid, urine                 Cutoff 50 ng/mL Barbiturates + metabolites, urine  Cutoff 200 ng/mL Benzodiazepine, urine              Cutoff 200 ng/mL Methadone, urine                   Cutoff 300  ng/mL  The urine drug screen provides only a preliminary, unconfirmed analytical test result and should not be used for non-medical purposes. Clinical consideration and professional judgment should be applied to any positive drug screen result due to possible interfering substances. A more specific alternate chemical method must be used in order to obtain a confirmed analytical result. Gas chromatography / mass spectrometry (GC/MS) is the preferred confirm atory method. Performed at [facility], [address] [place], [place] [address]   Pregnancy, urine POC     Status: None   Collection Time: 05/19/20 10:50 PM  Result Value Ref [place]   Preg Test, Ur NEGATIVE NEGATIVE    Comment:        THE SENSITIVITY OF THIS METHODOLOGY IS >24 mIU/mL     Blood Alcohol level:  Lab Results  Component Value Date   ETH <10 05/19/2020   ETH <10 68/34/1964    Metabolic Disorder Labs: No results found for: HGBA1C, MPG No results found for: PROLACTIN No results found for: CHOL, TRIG, HDL, CHOLHDL, VLDL, LDLCALC  Physical Findings: AIMS: Facial and Oral Movements Muscles of Facial Expression: None, normal Lips and Perioral Area: None, normal Jaw: None, normal Tongue: None, normal,Extremity Movements Upper (arms, wrists, hands, fingers): None, normal Lower (legs, knees, ankles, toes): None, normal, Trunk Movements Neck, shoulders, hips: None, normal, Overall Severity Severity of abnormal movements (highest score from questions above): None, normal Incapacitation due to abnormal movements: None, normal Patient's awareness of abnormal movements (rate only patient's report): No Awareness, Dental Status Current problems with teeth and/or dentures?: No Does patient usually wear dentures?: No  CIWA:    COWS:     Musculoskeletal: Strength & Muscle Tone: within normal limits Gait & Station: normal Patient leans: N/A  Psychiatric Specialty Exam: Physical Exam Vitals and nursing  note reviewed.  Constitutional:      Appearance: She is well-developed.  HENT:     Head: Normocephalic and atraumatic.  Eyes:     Conjunctiva/sclera: Conjunctivae normal.     Pupils: Pupils are equal, round, and reactive to light.  Cardiovascular:     Heart sounds: Normal heart sounds.  Pulmonary:     Effort: Pulmonary effort is normal.  Abdominal:     Palpations: Abdomen is soft.  Musculoskeletal:        General: Normal range of motion.     Cervical back: Normal range of motion.  Skin:    General: Skin is warm and dry.  Neurological:     General: No focal deficit present.     Mental Status: She is alert.  Psychiatric:        Attention and Perception: She is inattentive.        Mood and Affect: Mood is anxious. Affect is flat.        Speech: Speech is  delayed.        Behavior: Behavior is slowed.        Thought Content: Thought content is paranoid and delusional. Thought content does not include homicidal or suicidal ideation.        Cognition and Memory: Cognition is impaired.     Review of Systems  Constitutional: Negative.   HENT: Negative.   Eyes: Negative.   Respiratory: Negative.   Cardiovascular: Negative.   Gastrointestinal: Negative.   Musculoskeletal: Negative.   Skin: Negative.   Neurological: Negative.   Psychiatric/Behavioral: Positive for behavioral problems, confusion, decreased concentration, dysphoric mood, hallucinations and sleep disturbance. The patient is nervous/anxious.     Blood pressure 106/60, pulse 99, temperature 98.5 F (36.9 C), temperature source Oral, resp. rate 18, height 5' (1.524 m), weight 126 kg, SpO2 99 %.Body mass index is 54.25 kg/m.  General Appearance: Casual  Eye Contact:  Minimal  Speech:  Slow  Volume:  Decreased  Mood:  Dysphoric  Affect:  Constricted  Thought Process:  Coherent  Orientation:  Full (Time, Place, and Person)  Thought Content:  Illogical, Delusions and Hallucinations: Auditory Visual  Suicidal  Thoughts:  No  Homicidal Thoughts:  No  Memory:  Immediate;   Fair Recent;   Fair Remote;   Fair  Judgement:  Fair  Insight:  Fair  Psychomotor Activity:  Decreased  Concentration:  Concentration: Fair  Recall:  [employer] of Knowledge:  Fair  Language:  Fair  Akathisia:  No  Handed:  Right  AIMS (if indicated):     Assets:  Desire for Improvement Housing Physical Health Resilience Social Support  ADL's:  Impaired  Cognition:  Impaired,  Mild  Sleep:  Number of Hours: 7     Treatment Plan Summary: Daily contact with patient to assess and evaluate symptoms and progress in treatment, Medication management and Plan Patient continues to endorse psychotic symptoms with presentation consistent with either schizophrenia or severe psychotic depression.  We will increase the dose of the Seroquel to 200 mg at night.  Gradually trying to get dose up to 1 more consistent with antipsychotic effect.  No other change to medicine.  Encourage patient to attempt to attend groups when possible.  Alethia Berthold, MD 05/21/2020, 2:02 PM

## 2020-05-21 NOTE — [facility] Suicide Risk Assessment (Signed)
[facility] INPATIENT:  Family/Significant Other Suicide Prevention Education  Suicide Prevention Education:  Education Completed; Pattricia Boss, mother, [phone] has been identified by the patient as the family member/significant other with whom the patient will be residing, and identified as the person(s) who will aid the patient in the event of a mental health crisis (suicidal ideations/suicide attempt).  With written consent from the patient, the family member/significant other has been provided the following suicide prevention education, prior to the and/or following the discharge of the patient.  The suicide prevention education provided includes the following:  Suicide risk factors  Suicide prevention and interventions  National Suicide Hotline telephone number  [facility] assessment telephone number  [facility] Emergency Assistance 911  [facility] and/or Residential Mobile Crisis Unit telephone number  Request made of family/significant other to:  Remove weapons (e.g., guns, rifles, knives), all items previously/currently identified as safety concern.    Remove drugs/medications (over-the-counter, prescriptions, illicit drugs), all items previously/currently identified as a safety concern.  The family member/significant other verbalizes understanding of the suicide prevention education information provided.  The family member/significant other agrees to remove the items of safety concern listed above.  Mother reports that patient was discharged from [facility] around [place] Day 2021.  She reports that the patient was "most like herself" once she was on her Seroquel.  Mother reports that the family later went on a trip to [place], where the patient was doing well and then went on a trip to the beach.  Mother reports that while at the beach the patient smoked marijuana and once returning from the beach the patient has continued to decrease in wellbeing. She  reports that she has had concerns in the past with patient being aggressive to her siblings, however, this seems to have decreased since patient has been on Seroquel.    [place] Mo 05/21/2020, 11:37 AM

## 2020-05-22 ENCOUNTER — Ambulatory Visit: Payer: BC Managed Care – PPO | Admitting: Psychology

## 2020-05-22 DIAGNOSIS — F251 Schizoaffective disorder, depressive type: Secondary | ICD-10-CM | POA: Diagnosis not present

## 2020-05-22 MED ORDER — QUETIAPINE FUMARATE 100 MG PO TABS
300.0000 mg | ORAL_TABLET | Freq: Every day | ORAL | Status: DC
  Administered 2020-05-22 – 2020-05-23 (×2): 300 mg via ORAL
  Filled 2020-05-22 (×2): qty 3

## 2020-05-22 MED ORDER — IBUPROFEN 600 MG PO TABS
600.0000 mg | ORAL_TABLET | Freq: Four times a day (QID) | ORAL | Status: DC | PRN
  Administered 2020-05-22: 600 mg via ORAL
  Filled 2020-05-22: qty 1

## 2020-05-22 NOTE — Progress Notes (Signed)
[facility]  05/22/2020 2:04 PM Jill AngstMariah E Osborne  MRN:  [id number] Subjective: Follow-up for this young woman with psychotic symptoms.  Patient reports she continues to hear things and see things.  When she looks at certain surfaces they will become blurry and start to look like frightening figures.  Patient has not been acting out or violent.  She has been cooperative with treatment.  She says that subjectively she feels perhaps better than yesterday although there is no specifics provided.  Patient does not get out and interact very much although she has spoken with some other patients and been appropriate in conversation.  Compliant with treatment.  Less sedated today than she appeared to be yesterday Principal Problem: Schizoaffective disorder, depressive type (HCC) Diagnosis: Principal Problem:   Schizoaffective disorder, depressive type (HCC)  Total Time spent with patient: 30 minutes  Past Psychiatric History: Past history of progressive symptoms of mood and now psychosis over the last year  Past Medical History:  Past Medical History:  Diagnosis Date  . Asthma   . Depression   . Lumbar herniated disc   . Schizophrenia (HCC)   . Tetanus     Past Surgical History:  Procedure Laterality Date  . LAPAROSCOPIC APPENDECTOMY N/A 07/15/2016   Procedure: APPENDECTOMY LAPAROSCOPIC;  Surgeon: Ricarda Frameharles Woodham, MD;  Location: [facility] ORS;  Service: General;  Laterality: N/A;   Family History:  Family History  Problem Relation Age of Onset  . Healthy Mother   . Healthy Father    Family Psychiatric  History: See previous Social History:  Social History   Substance and Sexual Activity  Alcohol Use Yes     Social History   Substance and Sexual Activity  Drug Use Yes  . Types: Marijuana    Social History   Socioeconomic History  . Marital status: Single    Spouse name: Not on file  . Number of children: Not on file  . Years of education: Not on file  . Highest education  level: Not on file  Occupational History  . Not on file  Tobacco Use  . Smoking status: Never Smoker  . Smokeless tobacco: Never Used  Vaping Use  . Vaping Use: Never used  Substance and Sexual Activity  . Alcohol use: Yes  . Drug use: Yes    Types: Marijuana  . Sexual activity: Yes    Partners: Male    Birth control/protection: None, Condom  Other Topics Concern  . Not on file  Social History Narrative   Single.   Student at [employer], [employer].   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   Enjoys reading, spending time with friends.   Social Determinants of Health   Financial Resource Strain:   . Difficulty of Paying Living Expenses:   Food Insecurity:   . Worried About Programme researcher, broadcasting/film/videounning Out of Food in the Last Year:   . Baristaan Out of Food in the Last Year:   Transportation Needs:   . Freight forwarderLack of Transportation (Medical):   Marland Kitchen. Lack of Transportation (Non-Medical):   Physical Activity:   . Days of Exercise per Week:   . Minutes of Exercise per Session:   Stress:   . Feeling of Stress :   Social Connections:   . Frequency of Communication with Friends and Family:   . Frequency of Social Gatherings with Friends and Family:   . Attends Religious Services:   . Active Member of Clubs or Organizations:   . Attends BankerClub or Organization Meetings:   .  Marital Status:    Additional Social History:                         Sleep: Fair  Appetite:  Fair  Current Medications: Current Facility-Administered Medications  Medication Dose Route Frequency Provider Last Rate Last Admin  . acetaminophen (TYLENOL) tablet 650 mg  650 mg Oral Q6H PRN Rion Catala, Jackquline Denmark, MD   650 mg at 05/22/20 4917  . albuterol (PROVENTIL) (2.5 MG/3ML) 0.083% nebulizer solution 2.5 mg  2.5 mg Nebulization Q6H PRN Renie Stelmach T, MD      . albuterol (VENTOLIN HFA) 108 (90 Base) MCG/ACT inhaler 2 puff  2 puff Inhalation Q4H PRN Braniyah Besse T, MD      . alum & mag hydroxide-simeth (MAALOX/MYLANTA) 200-200-20 MG/5ML  suspension 30 mL  30 mL Oral Q4H PRN Cloud Graham T, MD      . escitalopram (LEXAPRO) tablet 10 mg  10 mg Oral Daily Daviona Herbert, Jackquline Denmark, MD   10 mg at 05/22/20 0805  . ibuprofen (ADVIL) tablet 600 mg  600 mg Oral Q6H PRN Giulietta Prokop T, MD      . LORazepam (ATIVAN) tablet 1 mg  1 mg Oral Q4H PRN Bruna Dills, Jackquline Denmark, MD   1 mg at 05/21/20 2129  . magnesium hydroxide (MILK OF MAGNESIA) suspension 30 mL  30 mL Oral Daily PRN Hollin Crewe T, MD      . QUEtiapine (SEROQUEL) tablet 300 mg  300 mg Oral QHS Marlen Koman, Jackquline Denmark, MD        Lab Results:  Results for orders placed or performed during the hospital encounter of 05/20/20 (from the past 48 hour(s))  Lipid panel     Status: Abnormal   Collection Time: 05/21/20  5:14 PM  Result Value Ref Range   Cholesterol 190 0 - 200 mg/dL   Triglycerides 99 <915 mg/dL   HDL 42 >05 mg/dL   Total CHOL/HDL Ratio 4.5 RATIO   VLDL 20 0 - 40 mg/dL   LDL Cholesterol 697 (H) 0 - 99 mg/dL    Comment:        Total Cholesterol/HDL:CHD Risk Coronary Heart Disease Risk Table                     Men   Women  1/2 Average Risk   3.4   3.3  Average Risk       5.0   4.4  2 X Average Risk   9.6   7.1  3 X Average Risk  23.4   11.0        Use the calculated Patient Ratio above and the CHD Risk Table to determine the patient's CHD Risk.        ATP III CLASSIFICATION (LDL):  <100     mg/dL   Optimal  948-016  mg/dL   Near or Above                    Optimal  130-159  mg/dL   Borderline  553-748  mg/dL   High  >270     mg/dL   Very High Performed at [facility], [address] [place], [place] [address]   TSH     Status: None   Collection Time: 05/21/20  5:14 PM  Result Value Ref Range   TSH 0.821 0.350 - 4.500 uIU/mL    Comment: Performed by a 3rd Generation assay with a functional sensitivity  of <=0.01 uIU/mL. Performed at [facility], [address] [place], [place] [address]     Blood Alcohol level:  Lab Results  Component Value  Date   [facility] <10 05/19/2020   ETH <10 01/22/2020    Metabolic Disorder Labs: No results found for: HGBA1C, MPG No results found for: PROLACTIN Lab Results  Component Value Date   CHOL 190 05/21/2020   TRIG 99 05/21/2020   HDL 42 05/21/2020   CHOLHDL 4.5 05/21/2020   VLDL 20 05/21/2020   LDLCALC 128 (H) 05/21/2020    Physical Findings: AIMS: Facial and Oral Movements Muscles of Facial Expression: None, normal Lips and Perioral Area: None, normal Jaw: None, normal Tongue: None, normal,Extremity Movements Upper (arms, wrists, hands, fingers): None, normal Lower (legs, knees, ankles, toes): None, normal, Trunk Movements Neck, shoulders, hips: None, normal, Overall Severity Severity of abnormal movements (highest score from questions above): None, normal Incapacitation due to abnormal movements: None, normal Patient's awareness of abnormal movements (rate only patient's report): No Awareness, Dental Status Current problems with teeth and/or dentures?: No Does patient usually wear dentures?: No  CIWA:    COWS:     Musculoskeletal: Strength & Muscle Tone: within normal limits Gait & Station: normal Patient leans: N/A  Psychiatric Specialty Exam: Physical Exam Vitals and nursing note reviewed.  Constitutional:      Appearance: She is well-developed.  HENT:     Head: Normocephalic and atraumatic.  Eyes:     Conjunctiva/sclera: Conjunctivae normal.     Pupils: Pupils are equal, round, and reactive to light.  Cardiovascular:     Heart sounds: Normal heart sounds.  Pulmonary:     Effort: Pulmonary effort is normal.  Abdominal:     Palpations: Abdomen is soft.  Musculoskeletal:        General: Normal range of motion.     Cervical back: Normal range of motion.  Skin:    General: Skin is warm and dry.  Neurological:     General: No focal deficit present.     Mental Status: She is alert.  Psychiatric:        Attention and Perception: She perceives auditory and visual  hallucinations.        Mood and Affect: Mood is anxious. Affect is blunt.        Speech: Speech is delayed.        Behavior: Behavior is slowed.        Thought Content: Thought content is paranoid and delusional. Thought content does not include homicidal or suicidal ideation.        Cognition and Memory: Cognition is impaired.        Judgment: Judgment normal.     Review of Systems  Constitutional: Negative.   HENT: Negative.   Eyes: Negative.   Respiratory: Negative.   Cardiovascular: Negative.   Gastrointestinal: Negative.   Musculoskeletal: Negative.   Skin: Negative.   Neurological: Negative.   Psychiatric/Behavioral: Positive for confusion and hallucinations.    Blood pressure 106/60, pulse 99, temperature 98.5 F (36.9 C), temperature source Oral, resp. rate 18, height 5' (1.524 m), weight 126 kg, SpO2 99 %.Body mass index is 54.25 kg/m.  General Appearance: Casual  Eye Contact:  Fair  Speech:  Slow  Volume:  Decreased  Mood:  Anxious  Affect:  Blunt  Thought Process:  Coherent  Orientation:  Full (Time, Place, and Person)  Thought Content:  Hallucinations: Auditory Visual  Suicidal Thoughts:  No  Homicidal Thoughts:  No  Memory:  Immediate;   Fair Recent;   Fair Remote;   Fair  Judgement:  Fair  Insight:  Fair  Psychomotor Activity:  Normal  Concentration:  Concentration: Fair  Recall:  [employer] of Knowledge:  Fair  Language:  Fair  Akathisia:  No  Handed:  Right  AIMS (if indicated):     Assets:  Desire for Improvement Housing Physical Health Social Support  ADL's:  Intact  Cognition:  Impaired,  Mild  Sleep:  Number of Hours: 7.5     Treatment Plan Summary: Plan Increase Seroquel up to 300 mg tonight with a goal of getting it up to 400 for psychosis.  Added Motrin for complaints of muscle aches and pains.  Supportive counseling and educational counseling.  Mordecai Rasmussen, MD 05/22/2020, 2:04 PM

## 2020-05-22 NOTE — Progress Notes (Signed)
Patient alert and alert and oriented x 4, affect is flat but she brightens upon approach she is receptive to staff, complaint with medication regimen, 15 minutes safety checks maintained will continue to monitor.

## 2020-05-22 NOTE — Progress Notes (Signed)
Pt is alert and oriented to person, place, time and situation. Pt is calm, cooperative, denies suicidal and homicidal ideation, denies hallucinations, denies feelings of depression, reports anxiety, rates it 7/10 on a 0-10 scale, 10 being worst. Pt reports she has been using coping skills like distraction and resting in her room to deal with her anxiety, and pt is aware that she is able to ask for PRN meds for anxiety if needed; pt verbalized understanding. Pt also c/o chronic upper shoulder pain, requested and was given PRN Tylenol PO for this pain. Pt reports she slept well last night which was the first good night sleep she said she had in days. Pt also reports that her appetite is good and she finished 100% of her breakfast. Pt's affect is flat, eye contact is fair, pt is soft-spoken. No distress noted, none reported, will continue to monitor pt per Q15 minute face checks and monitor for safety and progress.

## 2020-05-23 DIAGNOSIS — F251 Schizoaffective disorder, depressive type: Secondary | ICD-10-CM | POA: Diagnosis not present

## 2020-05-23 MED ORDER — HYDROXYZINE HCL 25 MG PO TABS
25.0000 mg | ORAL_TABLET | Freq: Three times a day (TID) | ORAL | Status: DC | PRN
  Administered 2020-05-24: 25 mg via ORAL
  Filled 2020-05-23: qty 1

## 2020-05-23 NOTE — Progress Notes (Signed)
Patient is alert and oriented x 4, affect is flat thoughts are organized and coherent she denies SI/HI/AVH no distress noted will continue to closely monitor, 15 minutes safety checks maintained.

## 2020-05-23 NOTE — [facility] Group Notes (Signed)
[facility] Group Notes: (Clinical Social Work)   05/23/2020      Type of Therapy:  Group Therapy   Participation Level:  Did Not Attend - was invited individually by Nurse/MHT and chose not to attend.   Susa Simmonds, LCSWA 05/23/2020  2:51 PM

## 2020-05-23 NOTE — Progress Notes (Signed)
[facility] MD Progress Note  05/23/2020 11:53 AM Jill Osborne  MRN:  [id number] Subjective: Patient is a 19 year old female who was admitted on 05/20/2020 secondary to worsening confusion and psychotic symptoms including auditory and visual hallucinations.  Objective: Patient is seen and examined.  Patient is a 19 year old female with a reported past psychiatric history significant for schizoaffective disorder; depression type who was admitted on 05/20/2020 secondary to worsening confusion, psychotic symptoms, auditory and visual hallucinations and suicidal ideation.  Yesterday she continued to report that she had auditory and visual hallucinations.  This morning she stated that she was not having any auditory or visual hallucinations.  She denied any suicidal or homicidal ideation.  She wanted to know when her possible discharge date was.  Her current medications include Lexapro, lorazepam and Seroquel.  Her vital signs are stable, she is afebrile.  She slept 7.5 hours last night.  Review of her admission laboratories showed essentially normal electrolytes, normal lipids, normal CBC, negative pregnancy test, normal TSH.  Drug screen was positive for cannabinoids.  Her EKG showed a normal sinus rhythm with a normal QTc interval.  Review of nursing notes from last p.m. also showed that she denied suicidal or homicidal ideation, and also denied auditory and visual hallucinations.  She denied any side effects to her current medications.  Principal Problem: Schizoaffective disorder, depressive type (HCC) Diagnosis: Principal Problem:   Schizoaffective disorder, depressive type (HCC)  Total Time spent with patient: 20 minutes  Past Psychiatric History: See admission H&P  Past Medical History:  Past Medical History:  Diagnosis Date  . Asthma   . Depression   . Lumbar herniated disc   . Schizophrenia (HCC)   . Tetanus     Past Surgical History:  Procedure Laterality Date  . LAPAROSCOPIC APPENDECTOMY N/A  07/15/2016   Procedure: APPENDECTOMY LAPAROSCOPIC;  Surgeon: Ricarda Frame, MD;  Location: [facility] ORS;  Service: General;  Laterality: N/A;   Family History:  Family History  Problem Relation Age of Onset  . Healthy Mother   . Healthy Father    Family Psychiatric  History: See admission H&P Social History:  Social History   Substance and Sexual Activity  Alcohol Use Yes     Social History   Substance and Sexual Activity  Drug Use Yes  . Types: Marijuana    Social History   Socioeconomic History  . Marital status: Single    Spouse name: Not on file  . Number of children: Not on file  . Years of education: Not on file  . Highest education level: Not on file  Occupational History  . Not on file  Tobacco Use  . Smoking status: Never Smoker  . Smokeless tobacco: Never Used  Vaping Use  . Vaping Use: Never used  Substance and Sexual Activity  . Alcohol use: Yes  . Drug use: Yes    Types: Marijuana  . Sexual activity: Yes    Partners: Male    Birth control/protection: None, Condom  Other Topics Concern  . Not on file  Social History Narrative   Single.   Student at [employer], [employer].   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   Enjoys reading, spending time with friends.   Social Determinants of Health   Financial Resource Strain:   . Difficulty of Paying Living Expenses:   Food Insecurity:   . Worried About Programme researcher, broadcasting/film/video in the Last Year:   . Barista in the Last Year:   [employer]  Needs:   . Lack of Transportation (Medical):   Marland Kitchen Lack of Transportation (Non-Medical):   Physical Activity:   . Days of Exercise per Week:   . Minutes of Exercise per Session:   Stress:   . Feeling of Stress :   Social Connections:   . Frequency of Communication with Friends and Family:   . Frequency of Social Gatherings with Friends and Family:   . Attends Religious Services:   . Active Member of Clubs or Organizations:   . Attends Banker Meetings:   Marland Kitchen  Marital Status:    Additional Social History:                         Sleep: Good  Appetite:  Fair  Current Medications: Current Facility-Administered Medications  Medication Dose Route Frequency Provider Last Rate Last Admin  . acetaminophen (TYLENOL) tablet 650 mg  650 mg Oral Q6H PRN Clapacs, Jackquline Denmark, MD   650 mg at 05/22/20 7510  . albuterol (PROVENTIL) (2.5 MG/3ML) 0.083% nebulizer solution 2.5 mg  2.5 mg Nebulization Q6H PRN Clapacs, John T, MD      . albuterol (VENTOLIN HFA) 108 (90 Base) MCG/ACT inhaler 2 puff  2 puff Inhalation Q4H PRN Clapacs, John T, MD      . alum & mag hydroxide-simeth (MAALOX/MYLANTA) 200-200-20 MG/5ML suspension 30 mL  30 mL Oral Q4H PRN Clapacs, John T, MD      . escitalopram (LEXAPRO) tablet 10 mg  10 mg Oral Daily Clapacs, Jackquline Denmark, MD   10 mg at 05/23/20 2585  . ibuprofen (ADVIL) tablet 600 mg  600 mg Oral Q6H PRN Clapacs, Jackquline Denmark, MD   600 mg at 05/22/20 2139  . LORazepam (ATIVAN) tablet 1 mg  1 mg Oral Q4H PRN Clapacs, Jackquline Denmark, MD   1 mg at 05/22/20 2106  . magnesium hydroxide (MILK OF MAGNESIA) suspension 30 mL  30 mL Oral Daily PRN Clapacs, John T, MD      . QUEtiapine (SEROQUEL) tablet 300 mg  300 mg Oral QHS Clapacs, Jackquline Denmark, MD   300 mg at 05/22/20 2106    Lab Results:  Results for orders placed or performed during the hospital encounter of 05/20/20 (from the past 48 hour(s))  Lipid panel     Status: Abnormal   Collection Time: 05/21/20  5:14 PM  Result Value Ref Range   Cholesterol 190 0 - 200 mg/dL   Triglycerides 99 <277 mg/dL   HDL 42 >82 mg/dL   Total CHOL/HDL Ratio 4.5 RATIO   VLDL 20 0 - 40 mg/dL   LDL Cholesterol 423 (H) 0 - 99 mg/dL    Comment:        Total Cholesterol/HDL:CHD Risk Coronary Heart Disease Risk Table                     Men   Women  1/2 Average Risk   3.4   3.3  Average Risk       5.0   4.4  2 X Average Risk   9.6   7.1  3 X Average Risk  23.4   11.0        Use the calculated Patient Ratio above and the  CHD Risk Table to determine the patient's CHD Risk.        ATP III CLASSIFICATION (LDL):  <100     mg/dL   Optimal  536-144  mg/dL   Near  or Above                    Optimal  130-159  mg/dL   Borderline  272-536  mg/dL   High  >644     mg/dL   Very High Performed at [facility], [address] [place], [place] [address]   TSH     Status: None   Collection Time: 05/21/20  5:14 PM  Result Value Ref Range   TSH 0.821 0.350 - 4.500 uIU/mL    Comment: Performed by a 3rd Generation assay with a functional sensitivity of <=0.01 uIU/mL. Performed at [facility], [address] [place], [place] [address]     Blood Alcohol level:  Lab Results  Component Value Date   [facility] <10 05/19/2020   ETH <10 01/22/2020    Metabolic Disorder Labs: No results found for: HGBA1C, MPG No results found for: PROLACTIN Lab Results  Component Value Date   CHOL 190 05/21/2020   TRIG 99 05/21/2020   HDL 42 05/21/2020   CHOLHDL 4.5 05/21/2020   VLDL 20 05/21/2020   LDLCALC 128 (H) 05/21/2020    Physical Findings: AIMS: Facial and Oral Movements Muscles of Facial Expression: None, normal Lips and Perioral Area: None, normal Jaw: None, normal Tongue: None, normal,Extremity Movements Upper (arms, wrists, hands, fingers): None, normal Lower (legs, knees, ankles, toes): None, normal, Trunk Movements Neck, shoulders, hips: None, normal, Overall Severity Severity of abnormal movements (highest score from questions above): None, normal Incapacitation due to abnormal movements: None, normal Patient's awareness of abnormal movements (rate only patient's report): No Awareness, Dental Status Current problems with teeth and/or dentures?: No Does patient usually wear dentures?: No  CIWA:    COWS:     Musculoskeletal: Strength & Muscle Tone: within normal limits Gait & Station: normal Patient leans: N/A  Psychiatric Specialty Exam: Physical Exam Vitals and nursing note reviewed.   Constitutional:      Appearance: Normal appearance.  HENT:     Head: Normocephalic and atraumatic.  Pulmonary:     Effort: Pulmonary effort is normal.  Neurological:     General: No focal deficit present.     Mental Status: She is alert and oriented to person, place, and time.     Review of Systems  Blood pressure 106/60, pulse 99, temperature 98.5 F (36.9 C), temperature source Oral, resp. rate 18, height 5' (1.524 m), weight 126 kg, SpO2 99 %.Body mass index is 54.25 kg/m.  General Appearance: Disheveled  Eye Contact:  Fair  Speech:  Normal Rate  Volume:  Normal  Mood:  Dysphoric  Affect:  Congruent  Thought Process:  Coherent and Descriptions of Associations: Circumstantial  Orientation:  Full (Time, Place, and Person)  Thought Content:  Logical  Suicidal Thoughts:  No  Homicidal Thoughts:  No  Memory:  Immediate;   Fair Recent;   Fair Remote;   Fair  Judgement:  Intact  Insight:  Fair  Psychomotor Activity:  Normal  Concentration:  Concentration: Fair and Attention Span: Fair  Recall:  [employer] of Knowledge:  Fair  Language:  Fair  Akathisia:  Negative  Handed:  Right  AIMS (if indicated):     Assets:  Desire for Improvement Resilience  ADL's:  Intact  Cognition:  WNL  Sleep:  Number of Hours: 7.5     Treatment Plan Summary: Daily contact with patient to assess and evaluate symptoms and progress in treatment, Medication management and Plan : Patient is  seen and examined.  Patient is a 19 year old female with the above-stated past psychiatric history who is seen in follow-up.   Diagnosis: 1.  Schizoaffective disorder; depressive type 2.  Cannabis use disorder 3.  Posttraumatic stress disorder  Pertinent findings on examination today: 1.  Patient denied auditory or visual hallucinations. 2.  No gross evidence of paranoia. 3.  Patient denied suicidal or homicidal ideation. 4.  Improved sleep with Seroquel. 5.  Mood seems to be improving from previous  reports.  Plan: 1.  Continue Lexapro 10 mg p.o. daily for anxiety and depression. 2.  Continue lorazepam 1 mg p.o. every 4 hours as needed anxiety. 3.  Continue Seroquel 300 mg p.o. nightly for mood stability and psychosis. 4.  Disposition planning-in progress.  Antonieta PertGreg Lawson Toccara Alford, MD 05/23/2020, 11:53 AM

## 2020-05-23 NOTE — Progress Notes (Signed)
Pt has been alert and oriented to person, place, time and situation. Pt is calm, cooperative, isolates in her room, comes out for meds and meals. Pt denies suicidal and homicidal ideation, denies hallucinations, denies feelings of depression and anxiety. Will continue to monitor pt per Q15 minute face checks and monitor for safety and progress.

## 2020-05-24 DIAGNOSIS — F251 Schizoaffective disorder, depressive type: Secondary | ICD-10-CM | POA: Diagnosis not present

## 2020-05-24 MED ORDER — QUETIAPINE FUMARATE 200 MG PO TABS
400.0000 mg | ORAL_TABLET | Freq: Every day | ORAL | Status: DC
  Administered 2020-05-24 – 2020-05-25 (×2): 400 mg via ORAL
  Filled 2020-05-24 (×2): qty 2

## 2020-05-24 NOTE — Progress Notes (Signed)
Pt is alert and oriented to person, place, time and situation. Pt is calm, cooperative, pleasant. Pt denies suicidal and homicidal ideation, denies hallucinations, denies feelings of depression, reports anxiety, requested and is given PRN PO meds for anxiety with good effect (see MAR). Pt isolates in her room, naps, rests quietly and reads at times. No distress noted, none reported, will continue to monitor pt per Q15 minute face checks and monitor for safety and progress.

## 2020-05-24 NOTE — Progress Notes (Signed)
[facility] MD Progress Note  05/24/2020 11:36 AM ETSUKO DIEROLF  MRN:  [id number] Subjective:  Patient is a 19 year old female who was admitted on 05/20/2020 secondary to worsening confusion and psychotic symptoms including auditory and visual hallucinations.  Objective: Patient is seen and examined.  Patient is a 19 year old female with a reported past psychiatric history significant for schizoaffective disorder; depressive type.  She stated this morning that she is having visual hallucinations.  She stated that they are shadows.  She stated that one of them looked like a corpus.  She had denied visual hallucinations yesterday, but stated today that they had been present for "a while".  She denied any auditory hallucinations.  She denied any suicidal or homicidal ideation.  She denied any side effects to her current medications.  Her blood pressure this morning is 95/56.  She is afebrile.  She slept 7.5 hours last night.  No new laboratories.  Her current medications include Lexapro and Seroquel.  Principal Problem: Schizoaffective disorder, depressive type (HCC) Diagnosis: Principal Problem:   Schizoaffective disorder, depressive type (HCC)  Total Time spent with patient: 20 minutes  Past Psychiatric History: See admission H&P  Past Medical History:  Past Medical History:  Diagnosis Date   Asthma    Depression    Lumbar herniated disc    Schizophrenia (HCC)    Tetanus     Past Surgical History:  Procedure Laterality Date   LAPAROSCOPIC APPENDECTOMY N/A 07/15/2016   Procedure: APPENDECTOMY LAPAROSCOPIC;  Surgeon: Ricarda Frame, MD;  Location: [facility] ORS;  Service: General;  Laterality: N/A;   Family History:  Family History  Problem Relation Age of Onset   Healthy Mother    Healthy Father    Family Psychiatric  History: See admission H&P Social History:  Social History   Substance and Sexual Activity  Alcohol Use Yes     Social History   Substance and Sexual Activity  Drug  Use Yes   Types: Marijuana    Social History   Socioeconomic History   Marital status: Single    Spouse name: Not on file   Number of children: Not on file   Years of education: Not on file   Highest education level: Not on file  Occupational History   Not on file  Tobacco Use   Smoking status: Never Smoker   Smokeless tobacco: Never Used  Vaping Use   Vaping Use: Never used  Substance and Sexual Activity   Alcohol use: Yes   Drug use: Yes    Types: Marijuana   Sexual activity: Yes    Partners: Male    Birth control/protection: None, Condom  Other Topics Concern   Not on file  Social History Narrative   Single.   Student at [employer].   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   Enjoys reading, spending time with friends.   Social Determinants of Health   Financial Resource Strain:    Difficulty of Paying Living Expenses:   Food Insecurity:    Worried About Programme researcher, broadcasting/film/video in the Last Year:    Barista in the Last Year:   Transportation Needs:    Freight forwarder (Medical):    Lack of Transportation (Non-Medical):   Physical Activity:    Days of Exercise per Week:    Minutes of Exercise per Session:   Stress:    Feeling of Stress :   Social Connections:    Frequency of Communication with Friends and Family:  Frequency of Social Gatherings with Friends and Family:    Attends Religious Services:    Active Member of Clubs or Organizations:    Attends Banker Meetings:    Marital Status:    Additional Social History:                         Sleep: Good  Appetite:  Good  Current Medications: Current Facility-Administered Medications  Medication Dose Route Frequency Provider Last Rate Last Admin   acetaminophen (TYLENOL) tablet 650 mg  650 mg Oral Q6H PRN Clapacs, Jackquline Denmark, MD   650 mg at 05/23/20 2125   albuterol (PROVENTIL) (2.5 MG/3ML) 0.083% nebulizer solution 2.5 mg  2.5 mg Nebulization  Q6H PRN Clapacs, John T, MD       albuterol (VENTOLIN HFA) 108 (90 Base) MCG/ACT inhaler 2 puff  2 puff Inhalation Q4H PRN Clapacs, John T, MD       alum & mag hydroxide-simeth (MAALOX/MYLANTA) 200-200-20 MG/5ML suspension 30 mL  30 mL Oral Q4H PRN Clapacs, John T, MD       escitalopram (LEXAPRO) tablet 10 mg  10 mg Oral Daily Clapacs, John T, MD   10 mg at 05/24/20 0844   hydrOXYzine (ATARAX/VISTARIL) tablet 25 mg  25 mg Oral TID PRN Antonieta Pert, MD   25 mg at 05/24/20 0845   ibuprofen (ADVIL) tablet 600 mg  600 mg Oral Q6H PRN Clapacs, John T, MD   600 mg at 05/22/20 2139   LORazepam (ATIVAN) tablet 1 mg  1 mg Oral Q4H PRN Clapacs, John T, MD   1 mg at 05/23/20 2126   magnesium hydroxide (MILK OF MAGNESIA) suspension 30 mL  30 mL Oral Daily PRN Clapacs, John T, MD       QUEtiapine (SEROQUEL) tablet 300 mg  300 mg Oral QHS Clapacs, John T, MD   300 mg at 05/23/20 2126    Lab Results: No results found for this or any previous visit (from the past 48 hour(s)).  Blood Alcohol level:  Lab Results  Component Value Date   ETH <10 05/19/2020   ETH <10 01/22/2020    Metabolic Disorder Labs: No results found for: HGBA1C, MPG No results found for: PROLACTIN Lab Results  Component Value Date   CHOL 190 05/21/2020   TRIG 99 05/21/2020   HDL 42 05/21/2020   CHOLHDL 4.5 05/21/2020   VLDL 20 05/21/2020   LDLCALC 128 (H) 05/21/2020    Physical Findings: AIMS: Facial and Oral Movements Muscles of Facial Expression: None, normal Lips and Perioral Area: None, normal Jaw: None, normal Tongue: None, normal,Extremity Movements Upper (arms, wrists, hands, fingers): None, normal Lower (legs, knees, ankles, toes): None, normal, Trunk Movements Neck, shoulders, hips: None, normal, Overall Severity Severity of abnormal movements (highest score from questions above): None, normal Incapacitation due to abnormal movements: None, normal Patient's awareness of abnormal movements (rate  only patient's report): No Awareness, Dental Status Current problems with teeth and/or dentures?: No Does patient usually wear dentures?: No  CIWA:    COWS:     Musculoskeletal: Strength & Muscle Tone: within normal limits Gait & Station: normal Patient leans: N/A  Psychiatric Specialty Exam: Physical Exam Vitals and nursing note reviewed.  Constitutional:      Appearance: Normal appearance.  HENT:     Head: Normocephalic and atraumatic.  Pulmonary:     Effort: Pulmonary effort is normal.  Neurological:     General: No focal deficit  present.     Mental Status: She is alert and oriented to person, place, and time.     Review of Systems  Blood pressure (!) 95/56, pulse 91, temperature 97.7 F (36.5 C), temperature source Oral, resp. rate 18, height 5' (1.524 m), weight 126 kg, SpO2 99 %.Body mass index is 54.25 kg/m.  General Appearance: Disheveled  Eye Contact:  Fair  Speech:  Normal Rate  Volume:  Decreased  Mood:  Dysphoric  Affect:  Congruent  Thought Process:  Goal Directed and Descriptions of Associations: Loose  Orientation:  Full (Time, Place, and Person)  Thought Content:  Hallucinations: Auditory  Suicidal Thoughts:  No  Homicidal Thoughts:  No  Memory:  Immediate;   Fair Recent;   Fair Remote;   Fair  Judgement:  Intact  Insight:  Fair  Psychomotor Activity:  Decreased  Concentration:  Concentration: Fair and Attention Span: Fair  Recall:  [employer] of Knowledge:  Fair  Language:  Good  Akathisia:  Negative  Handed:  Right  AIMS (if indicated):     Assets:  Desire for Improvement Resilience  ADL's:  Intact  Cognition:  WNL  Sleep:  Number of Hours: 7.5     Treatment Plan Summary: Daily contact with patient to assess and evaluate symptoms and progress in treatment, Medication management and Plan : Patient is seen and examined.  Patient is a 19 year old female with the above-stated past psychiatric history who is seen in follow-up.    Diagnosis: 1.  Schizoaffective disorder; depressive type 2.  Cannabis use disorder 3.  Posttraumatic stress disorder  Pertinent findings on examination today: 1.  Patient complained of visual hallucinations. 2.  Visual hallucinations also had a degree of paranoia. 3.  Patient denied suicidal or homicidal ideation. 4.  Patient seems more anxious today than yesterday.  Plan: 1.  Continue Lexapro 10 mg p.o. daily for anxiety and depression. 2.  Continue lorazepam 1 mg p.o. every 4 hours as needed anxiety. 3.  Increase Seroquel to 400 mg p.o. nightly for mood stability, psychosis and insomnia. 4.  Disposition planning-in progress.  Antonieta Pert, MD 05/24/2020, 11:36 AM

## 2020-05-24 NOTE — [facility] Group Notes (Signed)
[facility] LCSW Group Therapy Note  Date/Time:  05/24/2020 1:20 PM- 2:05 PM   Type of Therapy and Topic:  Group Therapy:  Healthy and Unhealthy Supports  Participation Level:  Minimal   Description of Group:  Patients in this group were introduced to the idea of adding a variety of healthy supports to address the various needs in their lives.Patients discussed what additional healthy supports could be helpful in their recovery and wellness after discharge in order to prevent future hospitalizations.   An emphasis was placed on using counselor, doctor, therapy groups, 12-step groups, and problem-specific support groups to expand supports.  They also worked as a group on developing a specific plan for several patients to deal with unhealthy supports through boundary-setting, psychoeducation with loved ones, and even termination of relationships.   Therapeutic Goals:   1)  discuss importance of adding supports to stay well once out of the hospital  2)  compare healthy versus unhealthy supports and identify some examples of each  3)  generate ideas and descriptions of healthy supports that can be added  4)  offer mutual support about how to address unhealthy supports  5)  encourage active participation in and adherence to discharge plan    Summary of Patient Progress:  Patient checked into group feeling better today. Patient was quiet during group and did not say much. Patient had her head down on the table for part of the group. Patient only spoke when she was called on. Patient identified her family as healthy support and could not think of any unhealthy supports. Patient stated her supports can be overbearing at times. Patient stated she likes to hide behind her books and uses them as supports.   Therapeutic Modalities:   Motivational Interviewing Brief Solution-Focused Therapy  Susa Simmonds, Theresia Majors 05/24/2020  4:48 PM

## 2020-05-24 NOTE — Plan of Care (Signed)
  Problem: Activity: Goal: Will verbalize the importance of balancing activity with adequate rest periods Outcome: Progressing   Problem: Education: Goal: Will be free of psychotic symptoms Outcome: Progressing Goal: Knowledge of the prescribed therapeutic regimen will improve Outcome: Progressing   Problem: Coping: Goal: Coping ability will improve Outcome: Progressing Goal: Will verbalize feelings Outcome: Progressing   Problem: Health Behavior/Discharge Planning: Goal: Compliance with prescribed medication regimen will improve Outcome: Progressing   Problem: Nutritional: Goal: Ability to achieve adequate nutritional intake will improve Outcome: Progressing   Problem: Role Relationship: Goal: Ability to communicate needs accurately will improve Outcome: Progressing Goal: Ability to interact with others will improve Outcome: Progressing   Problem: Safety: Goal: Ability to redirect hostility and anger into socially appropriate behaviors will improve Outcome: Progressing Goal: Ability to remain free from injury will improve Outcome: Progressing   Problem: Self-Care: Goal: Ability to participate in self-care as condition permits will improve Outcome: Progressing   Problem: Self-Concept: Goal: Will verbalize positive feelings about self Description: Patient currently denies SI/HI/AVH affect is flat but she brightens upon approach  Outcome: Progressing   Problem: Consults Goal: Concurrent Medical Patient Education Description: (See Patient Education Module for education specifics) Outcome: Progressing   Problem: [facility] Concurrent Medical Problem Goal: LTG-Pt will be physically stable and he/significant other Description: (Patient will be physically stable and he/significant other will be able to verbalize understanding of follow-up care and symptoms that would warrant further treatment) Outcome: Progressing Goal: STG-Vital signs will be within defined limits or  stabilized Description: (STG- Vital signs will be within defined limits or stabilized for individual) Outcome: Progressing Goal: STG-Compliance with medication and/or treatment as ordered Description: (STG-Compliance with medication and/or treatment as ordered by MD) Outcome: Progressing Goal: STG-Verbalize two symptoms that would warrant further Description: (STG-Verbalize two symptoms that would warrant further treatment) Outcome: Progressing Goal: STG-Patient will participate in management/stabilization Description: (STG-Patient will participate in management/stabilization of medical condition) Outcome: Progressing Goal: STG-Other (Specify): Description: STG-Other Concurrent Medical (Specify): Outcome: Progressing   Problem: Education: Goal: Utilization of techniques to improve thought processes will improve Outcome: Progressing Goal: Knowledge of the prescribed therapeutic regimen will improve Outcome: Progressing   Problem: Activity: Goal: Interest or engagement in leisure activities will improve Outcome: Progressing Goal: Imbalance in normal sleep/wake cycle will improve Outcome: Progressing   Problem: Coping: Goal: Coping ability will improve Outcome: Progressing Goal: Will verbalize feelings Outcome: Progressing   Problem: Health Behavior/Discharge Planning: Goal: Ability to make decisions will improve Outcome: Progressing Goal: Compliance with therapeutic regimen will improve Outcome: Progressing   Problem: Role Relationship: Goal: Will demonstrate positive changes in social behaviors and relationships Outcome: Progressing   Problem: Safety: Goal: Ability to disclose and discuss suicidal ideas will improve Outcome: Progressing Goal: Ability to identify and utilize support systems that promote safety will improve Outcome: Progressing   Problem: Self-Concept: Goal: Will verbalize positive feelings about self Outcome: Progressing Goal: Level of anxiety will  decrease Outcome: Progressing

## 2020-05-24 NOTE — Plan of Care (Signed)
Patient denies SI / HI. Patient denies AVH tonight but reports recent episodes of visual hallucination yesterday. Patient reports anxiety 10/10. Patient is in bed, reading at time of reported 10/10 anxiety. Patient is isolative to room. Patient denies any acute distress or injury. Staff will continue to monitor and provide support as needed.    Problem: Activity: Goal: Will verbalize the importance of balancing activity with adequate rest periods Outcome: Progressing   Problem: Education: Goal: Will be free of psychotic symptoms Outcome: Not Progressing Goal: Knowledge of the prescribed therapeutic regimen will improve Outcome: Progressing   Problem: Coping: Goal: Will verbalize feelings Outcome: Progressing

## 2020-05-24 NOTE — Progress Notes (Signed)
Patient is quiet and reserved. She has been active on the unit this evening watching TV in  the milieu with minimal contact with other peers. She denies SI/HI/AVH but continues to endorse anxiety and depression.  Patient received prescribed medication and tolerated without incident.  She remains safe with 15 minute safety checks and informed to contact staff with any concerns.     Jill Butler-Nicholson, LPN

## 2020-05-25 DIAGNOSIS — F251 Schizoaffective disorder, depressive type: Secondary | ICD-10-CM | POA: Diagnosis not present

## 2020-05-25 NOTE — [facility] Counselor (Signed)
CSW called [facility] to make referral for patient to have a PHP program at discharge.   CSW left HIPAA compliant voicemail.  Penni Homans, MSW, LCSW 05/25/2020 11:28 AM

## 2020-05-25 NOTE — Plan of Care (Addendum)
D: Pt. During assessments this morning is observed in her room resting in bed just prior to our engagement. Pt. During our engagement was superficially engaged (mostly one word answers), but nonetheless, was amenable to participating in answering this writer's questions asked. Pt. Endorsed her mood as, "my mood is good", but affect notably incongruent (flat with decreased range/lability). Pt. Denied si/hi/VH, verbalized ability to continue to remain safe on the unit. Pt. Endorsed religous AH of, "I hear voices telling me I need to be punished for my sins". Pt. Reported AH have decreased in severity since admissions and verbalized she feels medications are helping. Pt. Reports she has been tolerating her medicines thus far, denies side effects. Pt. Orientation appeared grossly intact. Pt. Thought process for the most part appeared linear and goal directed. Pt. Endorsed anxiousness, given PRN medicine for comfort. Some neurovegetative symptoms noted as well.   A: Q x 15 minute observation checks in place/maintained for safety. Patient is provided with education throughout shift when appropriate and able.  Patient is given/offered medications per orders. Patient is encouraged to attend groups, participate in unit activities and continue with plan of care. Pt. Chart and plans of care reviewed. Pt. Given support and encouragement when appropriate and able.    R: Patient is complaint with medication and unit procedures thus far. Pt. Observed eating good, up for breakfast. Pt. Thus far has been isolative to room, not wanting to participate or go to groups on the unit.     Problem: Activity: Goal: Will verbalize the importance of balancing activity with adequate rest periods Outcome: Progressing   Problem: Education: Goal: Will be free of psychotic symptoms Outcome: Progressing Goal: Knowledge of the prescribed therapeutic regimen will improve Outcome: Progressing   Problem: Coping: Goal: Coping ability  will improve Outcome: Progressing Goal: Will verbalize feelings Outcome: Progressing   Problem: Health Behavior/Discharge Planning: Goal: Compliance with prescribed medication regimen will improve Outcome: Progressing   Problem: Nutritional: Goal: Ability to achieve adequate nutritional intake will improve Outcome: Progressing   Problem: Role Relationship: Goal: Ability to communicate needs accurately will improve Outcome: Progressing Goal: Ability to interact with others will improve Outcome: Progressing   Problem: Safety: Goal: Ability to redirect hostility and anger into socially appropriate behaviors will improve Outcome: Progressing Goal: Ability to remain free from injury will improve Outcome: Progressing   Problem: Self-Care: Goal: Ability to participate in self-care as condition permits will improve Outcome: Progressing   Problem: Self-Concept: Goal: Will verbalize positive feelings about self Description: Patient currently denies SI/HI/AVH affect is flat but she brightens upon approach  Outcome: Progressing   Problem: Consults Goal: Concurrent Medical Patient Education Description: (See Patient Education Module for education specifics) Outcome: Progressing   Problem: [facility] Concurrent Medical Problem Goal: LTG-Pt will be physically stable and he/significant other Description: (Patient will be physically stable and he/significant other will be able to verbalize understanding of follow-up care and symptoms that would warrant further treatment) Outcome: Progressing Goal: STG-Vital signs will be within defined limits or stabilized Description: (STG- Vital signs will be within defined limits or stabilized for individual) Outcome: Progressing Goal: STG-Compliance with medication and/or treatment as ordered Description: (STG-Compliance with medication and/or treatment as ordered by MD) Outcome: Progressing Goal: STG-Verbalize two symptoms that would warrant  further Description: (STG-Verbalize two symptoms that would warrant further treatment) Outcome: Progressing Goal: STG-Patient will participate in management/stabilization Description: (STG-Patient will participate in management/stabilization of medical condition) Outcome: Progressing Goal: STG-Other (Specify): Description: STG-Other Concurrent Medical (Specify): Outcome: Progressing  Problem: Education: Goal: Utilization of techniques to improve thought processes will improve Outcome: Progressing Goal: Knowledge of the prescribed therapeutic regimen will improve Outcome: Progressing   Problem: Activity: Goal: Interest or engagement in leisure activities will improve Outcome: Progressing Goal: Imbalance in normal sleep/wake cycle will improve Outcome: Progressing   Problem: Coping: Goal: Coping ability will improve Outcome: Progressing Goal: Will verbalize feelings Outcome: Progressing   Problem: Health Behavior/Discharge Planning: Goal: Ability to make decisions will improve Outcome: Progressing Goal: Compliance with therapeutic regimen will improve Outcome: Progressing   Problem: Role Relationship: Goal: Will demonstrate positive changes in social behaviors and relationships Outcome: Progressing   Problem: Safety: Goal: Ability to disclose and discuss suicidal ideas will improve Outcome: Progressing Goal: Ability to identify and utilize support systems that promote safety will improve Outcome: Progressing   Problem: Self-Concept: Goal: Will verbalize positive feelings about self Outcome: Progressing Goal: Level of anxiety will decrease Outcome: Progressing   

## 2020-05-25 NOTE — Progress Notes (Signed)
[facility] MD Progress Note  05/25/2020 5:15 PM Jill Osborne  MRN:  [id number] Subjective: Follow-up for this patient with schizophrenia.  Patient tells me she is feeling better.  She says that she no longer sees things scary when she takes her glasses off.  Still has a little bit of auditory hallucinations but is ignoring them.  She is able to interact with peers appropriately and come out of her room and interact well.  She is now up to 400 mg of Seroquel and tolerating the medicine well without oversedation. Principal Problem: Schizoaffective disorder, depressive type (HCC) Diagnosis: Principal Problem:   Schizoaffective disorder, depressive type (HCC)  Total Time spent with patient: 30 minutes  Past Psychiatric History: Past history of gradually developing psychosis over the last year or so  Past Medical History:  Past Medical History:  Diagnosis Date  . Asthma   . Depression   . Lumbar herniated disc   . Schizophrenia (HCC)   . Tetanus     Past Surgical History:  Procedure Laterality Date  . LAPAROSCOPIC APPENDECTOMY N/A 07/15/2016   Procedure: APPENDECTOMY LAPAROSCOPIC;  Surgeon: Ricarda Frame, MD;  Location: [facility] ORS;  Service: General;  Laterality: N/A;   Family History:  Family History  Problem Relation Age of Onset  . Healthy Mother   . Healthy Father    Family Psychiatric  History: See previous Social History:  Social History   Substance and Sexual Activity  Alcohol Use Yes     Social History   Substance and Sexual Activity  Drug Use Yes  . Types: Marijuana    Social History   Socioeconomic History  . Marital status: Single    Spouse name: Not on file  . Number of children: Not on file  . Years of education: Not on file  . Highest education level: Not on file  Occupational History  . Not on file  Tobacco Use  . Smoking status: Never Smoker  . Smokeless tobacco: Never Used  Vaping Use  . Vaping Use: Never used  Substance and Sexual Activity  .  Alcohol use: Yes  . Drug use: Yes    Types: Marijuana  . Sexual activity: Yes    Partners: Male    Birth control/protection: None, Condom  Other Topics Concern  . Not on file  Social History Narrative   Single.   Student at [employer], [employer].   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   Enjoys reading, spending time with friends.   Social Determinants of Health   Financial Resource Strain:   . Difficulty of Paying Living Expenses:   Food Insecurity:   . Worried About Programme researcher, broadcasting/film/video in the Last Year:   . Barista in the Last Year:   Transportation Needs:   . Freight forwarder (Medical):   Marland Kitchen Lack of Transportation (Non-Medical):   Physical Activity:   . Days of Exercise per Week:   . Minutes of Exercise per Session:   Stress:   . Feeling of Stress :   Social Connections:   . Frequency of Communication with Friends and Family:   . Frequency of Social Gatherings with Friends and Family:   . Attends Religious Services:   . Active Member of Clubs or Organizations:   . Attends Banker Meetings:   Marland Kitchen Marital Status:    Additional Social History:  Sleep: Good  Appetite:  Good  Current Medications: Current Facility-Administered Medications  Medication Dose Route Frequency Provider Last Rate Last Admin  . acetaminophen (TYLENOL) tablet 650 mg  650 mg Oral Q6H PRN Juanmanuel Marohl, Jackquline Denmark, MD   650 mg at 05/23/20 2125  . albuterol (PROVENTIL) (2.5 MG/3ML) 0.083% nebulizer solution 2.5 mg  2.5 mg Nebulization Q6H PRN Demont Linford T, MD      . albuterol (VENTOLIN HFA) 108 (90 Base) MCG/ACT inhaler 2 puff  2 puff Inhalation Q4H PRN Nashia Remus T, MD      . alum & mag hydroxide-simeth (MAALOX/MYLANTA) 200-200-20 MG/5ML suspension 30 mL  30 mL Oral Q4H PRN Hampton Cost T, MD      . escitalopram (LEXAPRO) tablet 10 mg  10 mg Oral Daily Maison Kestenbaum T, MD   10 mg at 05/25/20 0751  . hydrOXYzine (ATARAX/VISTARIL) tablet 25 mg  25 mg Oral  TID PRN Antonieta Pert, MD   25 mg at 05/24/20 0845  . ibuprofen (ADVIL) tablet 600 mg  600 mg Oral Q6H PRN Jakelin Taussig, Jackquline Denmark, MD   600 mg at 05/22/20 2139  . LORazepam (ATIVAN) tablet 1 mg  1 mg Oral Q4H PRN Joei Frangos, Jackquline Denmark, MD   1 mg at 05/25/20 0751  . magnesium hydroxide (MILK OF MAGNESIA) suspension 30 mL  30 mL Oral Daily PRN Lillyanne Bradburn T, MD      . QUEtiapine (SEROQUEL) tablet 400 mg  400 mg Oral QHS Antonieta Pert, MD   400 mg at 05/24/20 2111    Lab Results: No results found for this or any previous visit (from the past 48 hour(s)).  Blood Alcohol level:  Lab Results  Component Value Date   ETH <10 05/19/2020   ETH <10 01/22/2020    Metabolic Disorder Labs: No results found for: HGBA1C, MPG No results found for: PROLACTIN Lab Results  Component Value Date   CHOL 190 05/21/2020   TRIG 99 05/21/2020   HDL 42 05/21/2020   CHOLHDL 4.5 05/21/2020   VLDL 20 05/21/2020   LDLCALC 128 (H) 05/21/2020    Physical Findings: AIMS: Facial and Oral Movements Muscles of Facial Expression: None, normal Lips and Perioral Area: None, normal Jaw: None, normal Tongue: None, normal,Extremity Movements Upper (arms, wrists, hands, fingers): None, normal Lower (legs, knees, ankles, toes): None, normal, Trunk Movements Neck, shoulders, hips: None, normal, Overall Severity Severity of abnormal movements (highest score from questions above): None, normal Incapacitation due to abnormal movements: None, normal Patient's awareness of abnormal movements (rate only patient's report): No Awareness, Dental Status Current problems with teeth and/or dentures?: No Does patient usually wear dentures?: No  CIWA:    COWS:     Musculoskeletal: Strength & Muscle Tone: within normal limits Gait & Station: normal Patient leans: N/A  Psychiatric Specialty Exam: Physical Exam Vitals and nursing note reviewed.  Constitutional:      Appearance: She is well-developed.  HENT:     Head:  Normocephalic and atraumatic.  Eyes:     Conjunctiva/sclera: Conjunctivae normal.     Pupils: Pupils are equal, round, and reactive to light.  Cardiovascular:     Heart sounds: Normal heart sounds.  Pulmonary:     Effort: Pulmonary effort is normal.  Abdominal:     Palpations: Abdomen is soft.  Musculoskeletal:        General: Normal range of motion.     Cervical back: Normal range of motion.  Skin:    General: Skin is  warm and dry.  Neurological:     General: No focal deficit present.     Mental Status: She is alert.  Psychiatric:        Attention and Perception: Attention normal.        Mood and Affect: Affect is flat.        Speech: Speech is delayed.        Behavior: Behavior is cooperative.        Thought Content: Thought content is not delusional.        Cognition and Memory: Cognition is impaired.        Judgment: Judgment normal.     Review of Systems  Constitutional: Negative.   HENT: Negative.   Eyes: Negative.   Respiratory: Negative.   Cardiovascular: Negative.   Gastrointestinal: Negative.   Musculoskeletal: Negative.   Skin: Negative.   Neurological: Negative.   Psychiatric/Behavioral: Positive for hallucinations. Negative for dysphoric mood.    Blood pressure 92/62, pulse 87, temperature (!) 97 F (36.1 C), temperature source Oral, resp. rate 18, height 5' (1.524 m), weight 126 kg, SpO2 100 %.Body mass index is 54.25 kg/m.  General Appearance: Casual  Eye Contact:  Fair  Speech:  Slow  Volume:  Decreased  Mood:  Euthymic  Affect:  Constricted  Thought Process:  Coherent  Orientation:  Full (Time, Place, and Person)  Thought Content:  Hallucinations: Auditory and Rumination  Suicidal Thoughts:  No  Homicidal Thoughts:  No  Memory:  Immediate;   Fair Recent;   Fair Remote;   Fair  Judgement:  Impaired  Insight:  Shallow  Psychomotor Activity:  Normal  Concentration:  Concentration: Fair  Recall:  [employer] of Knowledge:  Fair  Language:   Fair  Akathisia:  No  Handed:  Right  AIMS (if indicated):     Assets:  Desire for Improvement Housing Physical Health  ADL's:  Intact  Cognition:  Impaired,  Mild  Sleep:  Number of Hours: 8.75     Treatment Plan Summary: Daily contact with patient to assess and evaluate symptoms and progress in treatment, Medication management and Plan Supportive counseling.  Review of medication.  She does seem quite a bit improved and probably ready for discharge tomorrow.  We will start making arrangements for likely discharge at that time on current medicine  Mordecai Rasmussen, MD 05/25/2020, 5:15 PM

## 2020-05-25 NOTE — Progress Notes (Signed)
Recreation Therapy Notes  Date: 05/25/2020  Time: 9:30 am   Location: Craft room    Behavioral response: N/A   Intervention Topic: Goals   Discussion/Intervention: Patient did not attend group.   Clinical Observations/Feedback:  Patient did not attend group.   Jaelene Garciagarcia LRT/CTRS        Cobie Marcoux 05/25/2020 10:49 AM 

## 2020-05-26 DIAGNOSIS — F251 Schizoaffective disorder, depressive type: Secondary | ICD-10-CM | POA: Diagnosis not present

## 2020-05-26 MED ORDER — QUETIAPINE FUMARATE 400 MG PO TABS: 400.0000 mg | ORAL_TABLET | Freq: Every day | ORAL | 1 refills | Status: DC

## 2020-05-26 MED ORDER — ESCITALOPRAM OXALATE 10 MG PO TABS: 10.0000 mg | ORAL_TABLET | Freq: Every day | ORAL | 1 refills | Status: DC

## 2020-05-26 MED ORDER — ALBUTEROL SULFATE HFA 108 (90 BASE) MCG/ACT IN AERS: 2.0000 | INHALATION_SPRAY | RESPIRATORY_TRACT | 1 refills | Status: DC | PRN

## 2020-05-26 NOTE — Progress Notes (Addendum)
Recreation Therapy Notes   Date: 05/26/2020  Time: 9:30 am  Location: Craft room   Behavioral response: Appropriate  Intervention Topic: Relaxation     Discussion/Intervention:  Group content today was focused on relaxation. The group defined relaxation and identified healthy ways to relax. Individuals expressed how much time they spend relaxing. Patients expressed how much their life would be if they did not make time for themselves to relax. The group stated ways they could improve their relaxation techniques in the future.  Individuals participated in the intervention "Time to Relax" where they had a chance to experience different relaxation techniques.  Clinical Observations/Feedback:  Patient came to group late due to unknown reasons. Individual was social with peers and staff while participating in the intervention. Jill Osborne LRT/CTRS         Shilee Biggs 05/26/2020 10:45 AM

## 2020-05-26 NOTE — Tx Team (Signed)
Interdisciplinary Treatment and Diagnostic Plan Update  05/26/2020 Time of Session: 9:00AM Jill Osborne MRN: [id number]  Principal Diagnosis: Schizoaffective disorder, depressive type (HCC)  Secondary Diagnoses: Principal Problem:   Schizoaffective disorder, depressive type (HCC)   Current Medications:  Current Facility-Administered Medications  Medication Dose Route Frequency Provider Last Rate Last Admin   acetaminophen (TYLENOL) tablet 650 mg  650 mg Oral Q6H PRN Clapacs, Jackquline Denmark, MD   650 mg at 05/23/20 2125   albuterol (PROVENTIL) (2.5 MG/3ML) 0.083% nebulizer solution 2.5 mg  2.5 mg Nebulization Q6H PRN Clapacs, John T, MD       albuterol (VENTOLIN HFA) 108 (90 Base) MCG/ACT inhaler 2 puff  2 puff Inhalation Q4H PRN Clapacs, John T, MD       alum & mag hydroxide-simeth (MAALOX/MYLANTA) 200-200-20 MG/5ML suspension 30 mL  30 mL Oral Q4H PRN Clapacs, John T, MD       escitalopram (LEXAPRO) tablet 10 mg  10 mg Oral Daily Clapacs, John T, MD   10 mg at 05/26/20 2585   hydrOXYzine (ATARAX/VISTARIL) tablet 25 mg  25 mg Oral TID PRN Antonieta Pert, MD   25 mg at 05/24/20 0845   ibuprofen (ADVIL) tablet 600 mg  600 mg Oral Q6H PRN Clapacs, Jackquline Denmark, MD   600 mg at 05/22/20 2139   Levonorgestrel IUD 19.5 mg  1 each Intrauterine Continuous Reva Bores, MD   19.5 mg at 04/26/18 1030   LORazepam (ATIVAN) tablet 1 mg  1 mg Oral Q4H PRN Clapacs, John T, MD   1 mg at 05/25/20 0751   magnesium hydroxide (MILK OF MAGNESIA) suspension 30 mL  30 mL Oral Daily PRN Clapacs, John T, MD       QUEtiapine (SEROQUEL) tablet 400 mg  400 mg Oral QHS Antonieta Pert, MD   400 mg at 05/25/20 2041   Current Outpatient Medications  Medication Sig Dispense Refill   albuterol (VENTOLIN HFA) 108 (90 Base) MCG/ACT inhaler Inhale 2 puffs into the lungs every 4 (four) hours as needed for wheezing or shortness of breath. 8 g 1   escitalopram (LEXAPRO) 10 MG tablet Take 1 tablet (10 mg total) by  mouth daily. 30 tablet 1   QUEtiapine (SEROQUEL) 400 MG tablet Take 1 tablet (400 mg total) by mouth at bedtime. 30 tablet 1   PTA Medications: No medications prior to admission.    Patient Stressors: Health problems Medication change or noncompliance  Patient Strengths: Motivation for treatment/growth Religious Affiliation Supportive family/friends  Treatment Modalities: Medication Management, Group therapy, Case management,  1 to 1 session with clinician, Psychoeducation, Recreational therapy.   Physician Treatment Plan for Primary Diagnosis: Schizoaffective disorder, depressive type (HCC) Long Term Goal(s): Improvement in symptoms so as ready for discharge Improvement in symptoms so as ready for discharge   Short Term Goals: Ability to verbalize feelings will improve Ability to demonstrate self-control will improve Ability to maintain clinical measurements within normal limits will improve Compliance with prescribed medications will improve  Medication Management: Evaluate patient's response, side effects, and tolerance of medication regimen.  Therapeutic Interventions: 1 to 1 sessions, Unit Group sessions and Medication administration.  Evaluation of Outcomes: Adequate for Discharge  Physician Treatment Plan for Secondary Diagnosis: Principal Problem:   Schizoaffective disorder, depressive type (HCC)  Long Term Goal(s): Improvement in symptoms so as ready for discharge Improvement in symptoms so as ready for discharge   Short Term Goals: Ability to verbalize feelings will improve Ability to demonstrate self-control will  improve Ability to maintain clinical measurements within normal limits will improve Compliance with prescribed medications will improve     Medication Management: Evaluate patient's response, side effects, and tolerance of medication regimen.  Therapeutic Interventions: 1 to 1 sessions, Unit Group sessions and Medication administration.  Evaluation  of Outcomes: Adequate for Discharge   RN Treatment Plan for Primary Diagnosis: Schizoaffective disorder, depressive type (HCC) Long Term Goal(s): Knowledge of disease and therapeutic regimen to maintain health will improve  Short Term Goals: Ability to demonstrate self-control, Ability to participate in decision making will improve, Ability to verbalize feelings will improve, Ability to identify and develop effective coping behaviors will improve and Compliance with prescribed medications will improve  Medication Management: RN will administer medications as ordered by provider, will assess and evaluate patient's response and provide education to patient for prescribed medication. RN will report any adverse and/or side effects to prescribing provider.  Therapeutic Interventions: 1 on 1 counseling sessions, Psychoeducation, Medication administration, Evaluate responses to treatment, Monitor vital signs and CBGs as ordered, Perform/monitor CIWA, COWS, AIMS and Fall Risk screenings as ordered, Perform wound care treatments as ordered.  Evaluation of Outcomes: Adequate for Discharge   LCSW Treatment Plan for Primary Diagnosis: Schizoaffective disorder, depressive type (HCC) Long Term Goal(s): Safe transition to appropriate next level of care at discharge, Engage patient in therapeutic group addressing interpersonal concerns.  Short Term Goals: Engage patient in aftercare planning with referrals and resources, Increase social support, Increase ability to appropriately verbalize feelings, Increase emotional regulation, Facilitate acceptance of mental health diagnosis and concerns and Increase skills for wellness and recovery  Therapeutic Interventions: Assess for all discharge needs, 1 to 1 time with Social worker, Explore available resources and support systems, Assess for adequacy in community support network, Educate family and significant other(s) on suicide prevention, Complete Psychosocial  Assessment, Interpersonal group therapy.  Evaluation of Outcomes: Adequate for Discharge   Progress in Treatment: Attending groups: Yes. Participating in groups: Yes. Taking medication as prescribed: Yes. Toleration medication: Yes. Family/Significant other contact made: Yes, individual(s) contacted:  once permission is given. Patient understands diagnosis: Yes. Discussing patient identified problems/goals with staff: Yes. Medical problems stabilized or resolved: Yes. Denies suicidal/homicidal ideation: Yes. Issues/concerns per patient self-inventory: No. Other: none  New problem(s) identified: No, Describe:  none  New Short Term/Long Term Goal(s): elimination of symptoms of psychosis, medication management for mood stabilization; elimination of SI thoughts; development of comprehensive mental wellness plan. Update 05/26/2020:  No changes at this time.   Patient Goals:  "feel less anxious"  Update 05/26/2020:  No changes at this time.  Discharge Plan or Barriers:  Patient reports plans to return to her home.  She reports that she would like to continue with her current aftercare provider at this time.  Update 05/26/2020:  Patient and mother requested referral for PHP.  CSW made referral.  Patient was given the admission paperwork to complete and take to her appointment.    Reason for Continuation of Hospitalization: Anxiety Delusions  Depression Hallucinations Medication stabilization  Estimated Length of Stay:  1-7 days   Recreational Therapy: Patient: N/A Patient Goal: Patient will engage in groups without prompting or encouragement from LRT x3 group sessions within 5 recreation therapy group sessions  Attendees: Patient:  05/26/2020 1:06 PM  Physician: Dr. Toni Amend, MD 05/26/2020 1:06 PM  Nursing:  05/26/2020 1:06 PM  RN Care Manager: 05/26/2020 1:06 PM  Social Worker: Penni Homans, LCSW 05/26/2020 1:06 PM  Recreational Therapist:  05/26/2020 1:06 PM  Other:  05/26/2020  1:06 PM  Other:  05/26/2020 1:06 PM  Other: 05/26/2020 1:06 PM    Scribe for Treatment Team: Harden Mo, LCSW 05/26/2020 1:06 PM

## 2020-05-26 NOTE — Progress Notes (Signed)
Discharge Note:   Pt discharged at this time. Pt left with her mother, her ride home. Pt upon discharge is alert and oriented to person, place, time and situation. Pt denies suicidal and homicidal ideation, denies hallucinations, denies feelings of depression and anxiety. Pt is calm, cooperative, and pleasant. Pt given discharge instructions which includes her follow up appointments, and discharge prescriptions, and discharge medication education; pt verbalized understanding of all. All personal belongings returned to pt upon discharge.

## 2020-05-26 NOTE — Progress Notes (Signed)
  [facility] Adult Case Management Discharge Plan :  Will you be returning to the same living situation after discharge:  Yes,  pt reports that she is returning home.  At discharge, do you have transportation home?: Yes,  CSW will assist patient with transportation.  Do you have the ability to pay for your medications: Yes,  BCBS  Release of information consent forms completed and in the chart;  Patient's signature needed at discharge.  Patient to Follow up at:  Follow-up Information    [facility] Follow up.   Why: You have been referred to the [facility] program, you have a virtual appointment scheduled for 1:30PM at 05/27/2020.  Thanks! Contact information: [address] [place],  [place]  [address] Main: [phone] Fax: [phone]              Next level of care provider has access to [facility] Link:no  Safety Planning and Suicide Prevention discussed: Yes,  SPE completed with the patient's mother.      Has patient been referred to the Quitline?: Patient refused referral  Patient has been referred for addiction treatment: Pt. refused referral  Harden Mo, LCSW 05/26/2020, 11:37 AM
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
## 2020-05-26 NOTE — Progress Notes (Signed)
Patient left belonging on the unit, during discharge. The patient was called by the [facility] and a message was left. The patient belongings were left in the search room on the BMU and will be available for pick up.

## 2020-05-26 NOTE — Plan of Care (Signed)
Patient was active in the milieu and was pleasant and cooperative. Denying thoughts of self harm. Denying hallucinations. Stayed in the dayroom with peers until bedtime. Had a snack and received bedtime medication. Had no concerns. Currently in bed sleeping. No sign of distress. Safety monitored as expected.

## 2020-05-26 NOTE — Discharge Summary (Signed)
Physician Discharge Summary Note  Patient:  Jill Osborne is an 19 y.o., female MRN:  [id number] DOB:  Feb 15, 2001 Patient phone:  [phone] (home)  Patient address:   [address] [place] [address],  Total Time spent with patient: 30 minutes  Date of Admission:  05/20/2020 Date of Discharge: 05/26/2020  Reason for Admission: Admitted because of a worsening of hallucinations both visual and auditory with worsening mood and functioning  Principal Problem: Schizoaffective disorder, depressive type [facility]) Discharge Diagnoses: Principal Problem:   Schizoaffective disorder, depressive type (HCC)   Past Psychiatric History: Past history of gradually more expressed psychotic symptoms over the past year  Past Medical History:  Past Medical History:  Diagnosis Date  . Asthma   . Depression   . Lumbar herniated disc   . Schizophrenia (HCC)   . Tetanus     Past Surgical History:  Procedure Laterality Date  . LAPAROSCOPIC APPENDECTOMY N/A 07/15/2016   Procedure: APPENDECTOMY LAPAROSCOPIC;  Surgeon: Ricarda Frame, MD;  Location: [facility] ORS;  Service: General;  Laterality: N/A;   Family History:  Family History  Problem Relation Age of Onset  . Healthy Mother   . Healthy Father    Family Psychiatric  History: See previous. Social History:  Social History   Substance and Sexual Activity  Alcohol Use Yes     Social History   Substance and Sexual Activity  Drug Use Yes  . Types: Marijuana    Social History   Socioeconomic History  . Marital status: Single    Spouse name: Not on file  . Number of children: Not on file  . Years of education: Not on file  . Highest education level: Not on file  Occupational History  . Not on file  Tobacco Use  . Smoking status: Never Smoker  . Smokeless tobacco: Never Used  Vaping Use  . Vaping Use: Never used  Substance and Sexual Activity  . Alcohol use: Yes  . Drug use: Yes    Types: Marijuana  . Sexual activity: Yes     Partners: Male    Birth control/protection: None, Condom  Other Topics Concern  . Not on file  Social History Narrative   Single.   Student at [employer], [employer].   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   Enjoys reading, spending time with friends.   Social Determinants of Health   Financial Resource Strain:   . Difficulty of Paying Living Expenses:   Food Insecurity:   . Worried About Programme researcher, broadcasting/film/video in the Last Year:   . Barista in the Last Year:   Transportation Needs:   . Freight forwarder (Medical):   Marland Kitchen Lack of Transportation (Non-Medical):   Physical Activity:   . Days of Exercise per Week:   . Minutes of Exercise per Session:   Stress:   . Feeling of Stress :   Social Connections:   . Frequency of Communication with Friends and Family:   . Frequency of Social Gatherings with Friends and Family:   . Attends Religious Services:   . Active Member of Clubs or Organizations:   . Attends Banker Meetings:   Marland Kitchen Marital Status:     Hospital Course: Patient was admitted to the psychiatric unit.  15-minute checks were maintained.  Patient displayed no dangerous or aggressive behavior in the hospital.  She remained withdrawn and frightened.  She was able to articulate her symptoms of hallucinations.  Patient was started on  Seroquel which was gradually titrated up to 400 mg at night.  She was also continued on 10 mg of Lexapro.  Although there was an adjustment.  With the Seroquel she is now tolerating it well without obvious oversedation.  Patient has reported for the last couple days that she has seen an improvement in her symptoms with decreased hallucinations.  Denies any suicidal thoughts.  She is always been cooperative and is agreeable to outpatient treatment.  At this point she will be discharged back to her family home and given prescriptions for current medicine.  Physical Findings: AIMS: Facial and Oral Movements Muscles of Facial Expression: None,  normal Lips and Perioral Area: None, normal Jaw: None, normal Tongue: None, normal,Extremity Movements Upper (arms, wrists, hands, fingers): None, normal Lower (legs, knees, ankles, toes): None, normal, Trunk Movements Neck, shoulders, hips: None, normal, Overall Severity Severity of abnormal movements (highest score from questions above): None, normal Incapacitation due to abnormal movements: None, normal Patient's awareness of abnormal movements (rate only patient's report): No Awareness, Dental Status Current problems with teeth and/or dentures?: No Does patient usually wear dentures?: No  CIWA:    COWS:     Musculoskeletal: Strength & Muscle Tone: within normal limits Gait & Station: normal Patient leans: N/A  Psychiatric Specialty Exam: Physical Exam Vitals and nursing note reviewed.  Constitutional:      Appearance: She is well-developed.  HENT:     Head: Normocephalic and atraumatic.  Eyes:     Conjunctiva/sclera: Conjunctivae normal.     Pupils: Pupils are equal, round, and reactive to light.  Cardiovascular:     Heart sounds: Normal heart sounds.  Pulmonary:     Effort: Pulmonary effort is normal.  Abdominal:     Palpations: Abdomen is soft.  Musculoskeletal:        General: Normal range of motion.     Cervical back: Normal range of motion.  Skin:    General: Skin is warm and dry.  Neurological:     General: No focal deficit present.     Mental Status: She is alert.  Psychiatric:        Attention and Perception: Attention normal.        Mood and Affect: Affect is blunt.        Speech: Speech is delayed.        Behavior: Behavior is slowed.        Thought Content: Thought content does not include homicidal or suicidal ideation.        Cognition and Memory: Cognition is impaired.        Judgment: Judgment normal.     Review of Systems  Constitutional: Negative.   HENT: Negative.   Eyes: Negative.   Respiratory: Negative.   Cardiovascular: Negative.    Gastrointestinal: Negative.   Musculoskeletal: Negative.   Skin: Negative.   Neurological: Negative.   Psychiatric/Behavioral: Positive for confusion.    Blood pressure 92/62, pulse 87, temperature (!) 97 F (36.1 C), temperature source Oral, resp. rate 18, height 5' (1.524 m), weight 126 kg, SpO2 100 %.Body mass index is 54.25 kg/m.  General Appearance: Casual  Eye Contact:  Good  Speech:  Clear and Coherent  Volume:  Normal  Mood:  Euthymic  Affect:  Congruent  Thought Process:  Goal Directed  Orientation:  Full (Time, Place, and Person)  Thought Content:  Logical  Suicidal Thoughts:  No  Homicidal Thoughts:  No  Memory:  Immediate;   Fair Recent;   Fair Remote;  Fair  Judgement:  Fair  Insight:  Fair  Psychomotor Activity:  Normal  Concentration:  Concentration: Fair  Recall:  Fair  Fund of Knowledge:  Fair  Language:  Fair  Akathisia:  No  Handed:  Right  AIMS (if indicated):     Assets:  Desire for Improvement  ADL's:  Intact  Cognition:  WNL  Sleep:  Number of Hours: 8.75        Has this patient used any form of tobacco in the last 30 days? (Cigarettes, Smokeless Tobacco, Cigars, and/or Pipes) Yes, No  Blood Alcohol level:  Lab Results  Component Value Date   ETH <10 05/19/2020   ETH <10 01/22/2020    Metabolic Disorder Labs:  No results found for: HGBA1C, MPG No results found for: PROLACTIN Lab Results  Component Value Date   CHOL 190 05/21/2020   TRIG 99 05/21/2020   HDL 42 05/21/2020   CHOLHDL 4.5 05/21/2020   VLDL 20 05/21/2020   LDLCALC 128 (H) 05/21/2020    See Psychiatric Specialty Exam and Suicide Risk Assessment completed by Attending Physician prior to discharge.  Discharge destination:  Home  Is patient on multiple antipsychotic therapies at discharge:  No   Has Patient had three or more failed trials of antipsychotic monotherapy by history:  No  Recommended Plan for Multiple Antipsychotic Therapies: NA  Discharge  Instructions    Diet - low sodium heart healthy   Complete by: As directed    Increase activity slowly   Complete by: As directed      Allergies as of 05/26/2020      Reactions   Shellfish Allergy Hives   Eggs Or Egg-derived Products Nausea And Vomiting      Medication List    STOP taking these medications   venlafaxine XR 37.5 MG 24 hr capsule Commonly known as: EFFEXOR-XR     TAKE these medications     Indication  albuterol 108 (90 Base) MCG/ACT inhaler Commonly known as: VENTOLIN HFA Inhale 2 puffs into the lungs every 4 (four) hours as needed for wheezing or shortness of breath. What changed: Another medication with the same name was removed. Continue taking this medication, and follow the directions you see here.  Indication: Asthma   escitalopram 10 MG tablet Commonly known as: LEXAPRO Take 1 tablet (10 mg total) by mouth daily.  Indication: Major Depressive Disorder   QUEtiapine 400 MG tablet Commonly known as: SEROQUEL Take 1 tablet (400 mg total) by mouth at bedtime. What changed:   medication strength  how much to take  Indication: Schizophrenia       Follow-up Information    [facility] Follow up.   Why: You have been referred to the [facility] program, you have a virtual appointment scheduled for 1:30PM at 05/27/2020.  Thanks! Contact information: [address] [place],  [place]  [address] Main: [phone] Fax: [phone]              Follow-up recommendations:  Activity:  Activity as tolerated Diet:  Regular diet Other:  Follow-up with outpatient treatment as recommended  Comments: Prescriptions given at discharge  Signed: Mordecai Rasmussen, MD 05/26/2020, 12:22 PM

## 2020-05-26 NOTE — Plan of Care (Signed)
  Problem: Self-Esteem Goal: STG - Patient will identify 3 positive ways of expressing themselves within 5 recreation therapy group sessions Description: STG - Patient will identify 3 positive ways of expressing themselves within 5 recreation therapy group sessions 05/26/2020 1137 by Ernest Haber, LRT Outcome: Not Applicable 0/14/1030 1314 by Ernest Haber, LRT Outcome: Not Met (add Reason) Note: Patient spent most of her time in her room.

## 2020-05-26 NOTE — Progress Notes (Signed)
Recreation Therapy Notes  INPATIENT RECREATION TR PLAN  Patient Details Name: Jill Osborne MRN: [id number] DOB: 05-16-2001 Today's Date: 05/26/2020  Rec Therapy Plan Is patient appropriate for Therapeutic Recreation?: Yes Treatment times per week: at least 3 Estimated Length of Stay: 5-7 days TR Treatment/Interventions: Group participation (Comment)  Discharge Criteria Pt will be discharged from therapy if:: Discharged Treatment plan/goals/alternatives discussed and agreed upon by:: Patient/family  Discharge Summary Short term goals set: Patient will identify 3 positive ways of expressing themselves within 5 recreation therapy group sessions Short term goals met: Not met Progress toward goals comments: Groups attended Which groups?: Other (Comment) (Relaxation) Reason goals not met: Patient spent most of her time in her room Therapeutic equipment acquired: N/A Reason patient discharged from therapy: Discharge from hospital Pt/family agrees with progress & goals achieved: Yes Date patient discharged from therapy: 05/26/20   Irven Ingalsbe 05/26/2020, 11:40 AM

## 2020-05-27 ENCOUNTER — Telehealth (HOSPITAL_COMMUNITY): Payer: Self-pay | Admitting: Licensed Clinical Social Worker

## 2020-05-27 ENCOUNTER — Other Ambulatory Visit: Payer: Self-pay

## 2020-05-27 ENCOUNTER — Other Ambulatory Visit (HOSPITAL_COMMUNITY): Payer: BC Managed Care – PPO

## 2020-05-28 ENCOUNTER — Other Ambulatory Visit (HOSPITAL_COMMUNITY): Payer: BC Managed Care – PPO | Attending: Psychiatry | Admitting: Licensed Clinical Social Worker

## 2020-05-28 ENCOUNTER — Other Ambulatory Visit: Payer: Self-pay

## 2020-05-28 ENCOUNTER — Telehealth (HOSPITAL_COMMUNITY): Payer: Self-pay | Admitting: Licensed Clinical Social Worker

## 2020-05-28 DIAGNOSIS — F251 Schizoaffective disorder, depressive type: Secondary | ICD-10-CM

## 2020-05-28 DIAGNOSIS — F419 Anxiety disorder, unspecified: Secondary | ICD-10-CM | POA: Diagnosis not present

## 2020-06-02 DIAGNOSIS — K0500 Acute gingivitis, plaque induced: Secondary | ICD-10-CM | POA: Diagnosis not present

## 2020-06-02 DIAGNOSIS — K036 Deposits [accretions] on teeth: Secondary | ICD-10-CM | POA: Diagnosis not present

## 2020-06-02 DIAGNOSIS — Z1281 Encounter for screening for malignant neoplasm of oral cavity: Secondary | ICD-10-CM | POA: Diagnosis not present

## 2020-06-15 NOTE — Psych (Signed)
Virtual Visit via Video Note  I connected with Jill Osborne on 05/28/20 at  2:00 PM EDT by a video enabled telemedicine application and verified that I am speaking with the correct person using two identifiers.   I discussed the limitations of evaluation and management by telemedicine and the availability of in person appointments. The patient expressed understanding and agreed to proceed.  I discussed the assessment and treatment plan with the patient. The patient was provided an opportunity to ask questions and all were answered. The patient agreed with the plan and demonstrated an understanding of the instructions.   The patient was advised to call back or seek an in-person evaluation if the symptoms worsen or if the condition fails to improve as anticipated.  I provided 60 minutes of non-face-to-face time during this encounter.   Donia Guiles, LCSW   Comprehensive Clinical Assessment (CCA) Note  06/15/2020 Jill Osborne [id number]  Visit Diagnosis:      ICD-10-CM   1. Schizoaffective disorder, depressive type (HCC)  F25.1       CCA Biopsychosocial  Intake/Chief Complaint:  CCA Intake With Chief Complaint CCA Part Two Date: 05/28/20 CCA Part Two Time: 1400 Chief Complaint/Presenting Problem: Pt presents as referral from [facility] post-discharge of psychiatric admission due to psychosis. Pt states mental health issues beginning last fall and 2 prior hospital admissions. Pt reports AVH which are at different times funny, harmless, and frightening. Pt states she was not provided follow-up after her hospitalization in May and that is likely the reason for her relapse. Pt states some improvement form her recent hospitalization including deceased anxiety and absence of VH, but also low energy and increased appetite. Pt reports medication compliance and support from her family. Pt denies current SI/HI. Patient's Currently Reported Symptoms/Problems: Pt reports depressed mood, confusion,  AH, poor appetite and low energy. Pt reports recent struggles with VH, anxiety, panic, and hopelessness. Individual's Strengths: Pt has some insight and good support  Mental Health Symptoms Depression:  Depression: Change in energy/activity, Worthlessness, Duration of symptoms greater than two weeks, Sleep (too much or little), Hopelessness, Fatigue  Mania:  Mania: None  Anxiety:   Anxiety: Fatigue, Sleep, Worrying, Tension  Psychosis:  Psychosis: Hallucinations, Duration of symptoms greater than six months  Trauma:  Trauma: None  Obsessions:  Obsessions: None  Compulsions:  Compulsions: None  Inattention:  Inattention: None  Hyperactivity/Impulsivity:  Hyperactivity/Impulsivity: N/A  Oppositional/Defiant Behaviors:  Oppositional/Defiant Behaviors: None  Emotional Irregularity:  Emotional Irregularity: None  Other Mood/Personality Symptoms:      Mental Status Exam Appearance and self-care  Stature:  Stature: Average  Weight:  Weight: Average weight  Clothing:  Clothing: Casual  Grooming:  Grooming: Normal  Cosmetic use:  Cosmetic Use: None  Posture/gait:  Posture/Gait: Normal  Motor activity:  Motor Activity: Not Remarkable  Sensorium  Attention:  Attention: Distractible  Concentration:  Concentration: Scattered  Orientation:  Orientation: X5  Recall/memory:  Recall/Memory: Normal  Affect and Mood  Affect:  Affect: Anxious  Mood:  Mood: Anxious, Depressed  Relating  Eye contact:  Eye Contact: Fleeting  Facial expression:  Facial Expression: Anxious, Depressed  Attitude toward examiner:  Attitude Toward Examiner: Guarded, Cooperative  Thought and Language  Speech flow: Speech Flow: Paucity  Thought content:  Thought Content: Suspicious  Preoccupation:  Preoccupations: Ruminations  Hallucinations:  Hallucinations: Auditory, Visual  Organization:     Company secretary of Knowledge:  Fund of Knowledge: Good  Intelligence:  Intelligence: Average  Abstraction:  Abstraction: Functional  Judgement:  Judgement: Fair  Dance movement psychotherapist:  Reality Testing: Realistic  Insight:  Insight: Fair  Decision Making:  Decision Making: Normal, Impulsive  Social Functioning  Social Maturity:     Social Judgement:     Stress  Stressors:  Stressors: Transitions  Coping Ability:  Coping Ability: Building surveyor Deficits:     Supports:  Supports: Family     Religion:    Leisure/Recreation: Leisure / Recreation Do You Have Hobbies?: Yes  Exercise/Diet: Exercise/Diet Do You Exercise?: No Have You Gained or Lost A Significant Amount of Weight in the Past Six Months?: No Do You Follow a Special Diet?: No Do You Have Any Trouble Sleeping?: Yes Explanation of Sleeping Difficulties: Pt reports issues sleeping when experiencing hallucinations   CCA Employment/Education  Employment/Work Situation: Employment / Work Situation Employment situation: Unemployed Has patient ever been in the [employer]?: No  Education: Education Is Patient Currently Attending School?: No Did Garment/textile technologist From [employer]?: Yes Did You Have An Individualized Education Program (IIEP): No Did You Have Any Difficulty At [employer]?: No   CCA Family/Childhood History  Family and Relationship History: Family history Marital status: Single Are you sexually active?: Yes What is your sexual orientation?: Heterosexual Does patient have children?: No  Childhood History:  Childhood History By whom was/is the patient raised?: Both parents Patient's description of current relationship with people who raised him/her: Pt reports good relationship with parents however struggling with being upfront about current symptoms Does patient have siblings?: Yes Did patient suffer any verbal/emotional/physical/sexual abuse as a child?: No Did patient suffer from severe childhood neglect?: No Has patient ever been sexually abused/assaulted/raped as an adolescent or adult?: No Was the patient  ever a victim of a crime or a disaster?: No Witnessed domestic violence?: No Has patient been affected by domestic violence as an adult?: No  Child/Adolescent Assessment:     CCA Substance Use  Alcohol/Drug Use: Alcohol / Drug Use Pain Medications: Pt denies Prescriptions: See MAR Over the Counter: Pt denies History of alcohol / drug use?: No history of alcohol / drug abuse         ASAM's:  Six Dimensions of Multidimensional Assessment  Dimension 1:  Acute Intoxication and/or Withdrawal Potential:      Dimension 2:  Biomedical Conditions and Complications:      Dimension 3:  Emotional, Behavioral, or Cognitive Conditions and Complications:     Dimension 4:  Readiness to Change:     Dimension 5:  Relapse, Continued use, or Continued Problem Potential:     Dimension 6:  Recovery/Living Environment:     ASAM Severity Score:    ASAM Recommended Level of Treatment:     Substance use Disorder (SUD)    Recommendations for Services/Supports/Treatments: Recommendations for Services/Supports/Treatments Recommendations For Services/Supports/Treatments: Partial Hospitalization, Individual Therapy, Medication Management  Pt is recommended PHP to increase stabilization post-discharge of hospitalization. Pt has declined services stating desire for individual treatment instead. Pt reports she has a current therapist and accepted referrals for individual treatment and psychiatry in the area. Pt is given contact information for PHP should she change her mind.   DSM5 Diagnoses: Patient Active Problem List   Diagnosis Date Noted  . Schizoaffective disorder, depressive type (HCC) 05/20/2020  . Schizophrenia (HCC) 03/26/2020  . Seizure-like activity (HCC) 02/10/2020  . History of depression 02/10/2020  . Memory impairment 02/10/2020  . Transient confusion 02/10/2020  . Unresponsive episode 02/10/2020  . Weight loss, abnormal 01/13/2020  . Fatigue  01/13/2020  . MDD (major depressive  disorder), recurrent severe, without psychosis (HCC) 07/23/2019    Patient Centered Plan: Patient is on the following Treatment Plan(s):  Pt has declined services at this agency   Referrals to Alternative Service(s): Referred to Alternative Service(s):   Place:   Date:   Time:    Referred to Alternative Service(s):   Place:   Date:   Time:    Referred to Alternative Service(s):   Place:   Date:   Time:    Referred to Alternative Service(s):   Place:   Date:   Time:     Donia Guiles

## 2020-07-10 ENCOUNTER — Other Ambulatory Visit: Payer: Self-pay | Admitting: Psychiatry

## 2020-07-19 ENCOUNTER — Other Ambulatory Visit: Payer: Self-pay | Admitting: Psychiatry

## 2020-07-23 ENCOUNTER — Other Ambulatory Visit: Payer: Self-pay

## 2020-07-23 ENCOUNTER — Ambulatory Visit (HOSPITAL_COMMUNITY)
Admission: EM | Admit: 2020-07-23 | Discharge: 2020-07-23 | Disposition: A | Discharge status: Home / Self Care | Payer: BC Managed Care – PPO

## 2020-07-23 DIAGNOSIS — F259 Schizoaffective disorder, unspecified: Secondary | ICD-10-CM | POA: Diagnosis not present

## 2020-07-23 NOTE — ED Notes (Signed)
Patient do not have any belongings in storage. Pt's belongings are given to pt's family member in the lobby.

## 2020-07-23 NOTE — ED Notes (Signed)
Discharge instructions given by PA Eddie.  Left ED A&O x 4, accompanied by family.  No distress noted.

## 2020-07-23 NOTE — BH Assessment (Addendum)
Comprehensive Clinical Assessment (CCA) Note  07/23/2020 Jill Osborne [id number]   Jill Osborne is a 19 year old female who presents voluntary and unaccompanied to [facility]. Clinician asked the pt, "what brought you to the hospital?" Pt reported, she's been on Seroquel for months and does not have a refill, her appointment with her psychiatrist (Dr. Edwin Dada) is on 08/03/2020. Pt reported, hearing quiet voices, laughing, comments on what is she doing. Pt reported, she last hear a voices this morning but she hears voices "sometimes." Pt denies, SI, HI, self-injuries behaviors and access to weapons.   Pt consented for clinician to speak to her mother Pattricia Boss, [phone]) to obtain collateral information. Per mother, October 2020 pt went to the hospital for suicidal ideations was put on Effexor. Per mother, in March 2021 pt started seeing a psychiatrist Rene Kocher) at [facility] who managed her medication. Per mother, in April 2021 pt didn't want to be on medications and was weaned off Effexor. Pt's mother reported, in May 2021 pt went to [facility] after a psychotic break however they did not have any available beds and the pt was referred to [facility], per mother the pt did not receive referrals to manage the pt's medications (Seroquel 50 mg) once disharged. Per mother, in August 2021, the pt had another psychotic break received inpatient treatment at [facility]. Pt's mother reported, the pt ran out of Seroquel 400 mg today, the does not have an upcoming appointment with Dr. Edwin Dada. Pt's mother denies concerns of SI, HI.   Pt denies, substance use. Pt reported, taking medications as prescribed (Seroquel and Lexapro. Pt has previous inpatient admissions.   Pt presents quiet, awake with normal speech. Pt's eye contact was normal. Pt's mood, affect are pleasant. Pt's thought content was appropriate to mood and circumstances. Pt was oriented x5. Pt's insight was good. Pt's judgement was normal. Pt reported, if  discharged from [facility] she can contract for safety.   Disposition: Otila Back, PA recommends pt does not meet inpatient treatment criteria and to follow up with OPT resources. Eddie, PA filled a prescription for Seroquel for the pt. Clinician spoke to the pt and her mother about open access tomorrow (Friday, 07/24/2020) for therapy and to get an appointment for medication management.   Diagnosis: Schizoaffective disorder, depressive type (HCC)   Visit Diagnosis:      ICD-10-CM   1. Schizoaffective disorder, unspecified type (HCC)  F25.9       CCA Screening, Triage and Referral (STR)  Patient Reported Information How did you hear about [place]? Self  Referral name: No data recorded Referral phone number: No data recorded  Whom do you see for routine medical problems? No data recorded Practice/Facility Name: No data recorded Practice/Facility Phone Number: No data recorded Name of Contact: No data recorded Contact Number: No data recorded Contact Fax Number: No data recorded Prescriber Name: No data recorded Prescriber Address (if known): No data recorded  What Is the Reason for Your Visit/Call Today? Medication refill.  How Long Has This Been Causing You Problems? No data recorded What Do You Feel Would Help You the Most Today? Medication;Therapy   Have You Recently Been in Any Inpatient Treatment (Hospital/Detox/Crisis Center/28-Day Program)? No  Name/Location of Program/Hospital:No data recorded How Long Were You There? No data recorded When Were You Discharged? No data recorded  Have You Ever Received Services From [facility] Before? Yes  Who Do You See at [facility]? [facility].   Have You Recently Had Any Thoughts About  Hurting Yourself? No  Are You Planning to Commit Suicide/Harm Yourself At This time? No   Have you Recently Had Thoughts About Hurting Someone Karolee Ohs? No  Explanation: No data recorded  Have You Used Any Alcohol or Drugs in the Past 24 Hours?  No  How Long Ago Did You Use Drugs or Alcohol? No data recorded What Did You Use and How Much? No data recorded  Do You Currently Have a Therapist/Psychiatrist? No  Name of Therapist/Psychiatrist: No data recorded  Have You Been Recently Discharged From Any Office Practice or Programs? No  Explanation of Discharge From Practice/Program: No data recorded    CCA Screening Triage Referral Assessment Type of Contact: Face-to-Face  Is this Initial or Reassessment? No data recorded Date Telepsych consult ordered in [facility]:  05/19/20  Time Telepsych consult ordered in [facility]:  1823   Patient Reported Information Reviewed? Yes  Patient Left Without Being Seen? No data recorded Reason for Not Completing Assessment: No data recorded  Collateral Involvement: No data recorded  Does Patient Have a Court Appointed Legal Guardian? No data recorded Name and Contact of Legal Guardian: Self  If Minor and Not Living with Parent(s), Who has Custody? No data recorded Is CPS involved or ever been involved? Never  Is APS involved or ever been involved? Never   Patient Determined To Be At Risk for Harm To Self or Others Based on Review of Patient Reported Information or Presenting Complaint? No  Method: No data recorded Availability of Means: No data recorded Intent: No data recorded Notification Required: No data recorded Additional Information for Danger to Others Potential: No data recorded Additional Comments for Danger to Others Potential: No data recorded Are There Guns or Other Weapons in Your Home? No  Types of Guns/Weapons: No data recorded Are These Weapons Safely Secured?                            No data recorded Who Could Verify You Are Able To Have These Secured: No data recorded Do You Have any Outstanding Charges, Pending Court Dates, Parole/Probation? No data recorded Contacted To Inform of Risk of Harm To Self or Others: No data recorded  Location of Assessment: [facility]  Assessment Services   Does Patient Present under Involuntary Commitment? No  IVC Papers Initial File Date: No data recorded  [place] of Residence: [place]   Patient Currently Receiving the Following Services: Medication Management;Individual Therapy   Determination of Need: Routine (7 days)   Options For Referral: Medication Management;Outpatient Therapy   CCA Biopsychosocial  Intake/Chief Complaint:  CCA Intake With Chief Complaint CCA Part Two Date: 07/23/20 CCA Part Two Time: 2039 Chief Complaint/Presenting Problem: Medicaition refill. Patient's Currently Reported Symptoms/Problems: Pt reported, running out of Seroquel and needing a refill. Individual's Strengths: Pt reported, goos sense of humor, resilient, keeping frienships. Individual's Preferences: Medicaiton refill. Type of Services Patient Feels Are Needed: Medication refill.  Mental Health Symptoms Depression:  Depression: None  Mania:  Mania: None  Anxiety:   Anxiety: None  Psychosis:  Psychosis: Hallucinations  Trauma:  Trauma: None  Obsessions:  Obsessions: None  Compulsions:  Compulsions: None  Inattention:     Hyperactivity/Impulsivity:  Hyperactivity/Impulsivity: N/A  Oppositional/Defiant Behaviors:  Oppositional/Defiant Behaviors: None  Emotional Irregularity:  Emotional Irregularity: None  Other Mood/Personality Symptoms:      Mental Status Exam Appearance and self-care  Stature:  Stature: Average  Weight:  Weight: Average weight  Clothing:  Clothing:  Casual  Grooming:  Grooming: Normal  Cosmetic use:  Cosmetic Use: None  Posture/gait:  Posture/Gait: Normal  Motor activity:  Motor Activity: Not Remarkable  Sensorium  Attention:  Attention: Normal  Concentration:  Concentration: Normal  Orientation:  Orientation: X5  Recall/memory:  Recall/Memory: Normal  Affect and Mood  Affect:  Affect: Other (Comment) (Pleasant.)  Mood:  Mood: Other (Comment) (Pleasant.)  Relating  Eye contact:  Eye  Contact: Normal  Facial expression:  Facial Expression: Responsive  Attitude toward examiner:  Attitude Toward Examiner: Cooperative  Thought and Language  Speech flow: Speech Flow: Normal  Thought content:  Thought Content: Appropriate to Mood and Circumstances  Preoccupation:  Preoccupations: None  Hallucinations:  Hallucinations: Auditory  Organization:     Company secretary of Knowledge:  Fund of Knowledge: Average  Intelligence:  Intelligence: Average  Abstraction:  Abstraction: Functional (Per chart.)  Judgement:  Judgement: Normal  Reality Testing:  Reality Testing: Realistic (Per chart.)  Insight:  Insight: Good  Decision Making:  Decision Making: Normal  Social Functioning  Social Maturity:  Social Maturity: Responsible  Social Judgement:  Social Judgement: Normal  Stress  Stressors:  Stressors: Transitions (Per chart.)  Coping Ability:  Coping Ability: Overwhelmed (Per chart.)  Skill Deficits:     Supports:  Supports: Friends/Service system     Religion: Religion/Spirituality Are You A Religious Person?: No  Leisure/Recreation: Leisure / Recreation Do You Have Hobbies?: Yes Leisure and Hobbies: Secondary school teacher, painting.  Exercise/Diet: Exercise/Diet Do You Exercise?: No Have You Gained or Lost A Significant Amount of Weight in the Past Six Months?: No (Per chart.) Do You Follow a Special Diet?: No Do You Have Any Trouble Sleeping?: No   CCA Employment/Education  Employment/Work Situation: Employment / Work Situation Employment situation: Unemployed What is the longest time patient has a held a job?: NA Where was the patient employed at that time?: NA Has patient ever been in the [employer]?: No  Education: Education Is Patient Currently Attending School?: No Last Grade Completed: 12 Name of High School: [employer]. Did You Graduate From [employer]?: Yes Did You Attend College?: Yes What Type of College Degree Do you  Have?: [employer], pt wants to re-enroll. Did You Attend Graduate School?: No Did You Have An Individualized Education Program (IIEP): No (Pt denies.) Did You Have Any Difficulty At School?: No (Pt denies.)   CCA Family/Childhood History  Family and Relationship History: Family history Marital status: Single Are you sexually active?:  (NA) What is your sexual orientation?: NA Has your sexual activity been affected by drugs, alcohol, medication, or emotional stress?: NA Does patient have children?: No  Childhood History:  Childhood History By whom was/is the patient raised?:  (NA) Additional childhood history information: NA Description of patient's relationship with caregiver when they were a child: NA Patient's description of current relationship with people who raised him/her: NA How were you disciplined when you got in trouble as a child/adolescent?: NA Does patient have siblings?: Yes Number of Siblings: 2 Description of patient's current relationship with siblings: NA Did patient suffer any verbal/emotional/physical/sexual abuse as a child?: No (Pt denies.) Did patient suffer from severe childhood neglect?: No (Pt denies.) Has patient ever been sexually abused/assaulted/raped as an adolescent or adult?: No (Pt denies.) Was the patient ever a victim of a crime or a disaster?: No (Pt denies.) Witnessed domestic violence?: No (Pt denies.) Has patient been affected by domestic violence as an adult?:  (NA)  Child/Adolescent Assessment:  CCA Substance Use  Alcohol/Drug Use: Alcohol / Drug Use Pain Medications: See MAR Prescriptions: See MAR Over the Counter: See MAR History of alcohol / drug use?: No history of alcohol / drug abuse (Pt denies.) Longest period of sobriety (when/how long): NA     ASAM's:  Six Dimensions of Multidimensional Assessment  Dimension 1:  Acute Intoxication and/or Withdrawal Potential:      Dimension 2:  Biomedical Conditions and  Complications:      Dimension 3:  Emotional, Behavioral, or Cognitive Conditions and Complications:     Dimension 4:  Readiness to Change:     Dimension 5:  Relapse, Continued use, or Continued Problem Potential:     Dimension 6:  Recovery/Living Environment:     ASAM Severity Score:    ASAM Recommended Level of Treatment:     Substance use Disorder (SUD)    Recommendations for Services/Supports/Treatments: Recommendations for Services/Supports/Treatments Recommendations For Services/Supports/Treatments: Medication Management, Individual Therapy  DSM5 Diagnoses: Patient Active Problem List   Diagnosis Date Noted  . Schizoaffective disorder, depressive type (HCC) 05/20/2020  . Schizophrenia (HCC) 03/26/2020  . Seizure-like activity (HCC) 02/10/2020  . History of depression 02/10/2020  . Memory impairment 02/10/2020  . Transient confusion 02/10/2020  . Unresponsive episode 02/10/2020  . Weight loss, abnormal 01/13/2020  . Fatigue 01/13/2020  . MDD (major depressive disorder), recurrent severe, without psychosis (HCC) 07/23/2019     Referrals to Alternative Service(s): Referred to Alternative Service(s):   Place:   Date:   Time:    Referred to Alternative Service(s):   Place:   Date:   Time:    Referred to Alternative Service(s):   Place:   Date:   Time:    Referred to Alternative Service(s):   Place:   Date:   Time:     Redmond Pulling  Comprehensive Clinical Assessment (CCA) Screening, Triage and Referral Note  07/23/2020 Jill Osborne [id number]  Visit Diagnosis:    ICD-10-CM   1. Schizoaffective disorder, unspecified type (HCC)  F25.9     Patient Reported Information How did you hear about [place]? Self   Referral name: No data recorded  Referral phone number: No data recorded Whom do you see for routine medical problems? No data recorded  Practice/Facility Name: No data recorded  Practice/Facility Phone Number: No data recorded  Name of Contact: No data  recorded  Contact Number: No data recorded  Contact Fax Number: No data recorded  Prescriber Name: No data recorded  Prescriber Address (if known): No data recorded What Is the Reason for Your Visit/Call Today? Medication refill.  How Long Has This Been Causing You Problems? No data recorded Have You Recently Been in Any Inpatient Treatment (Hospital/Detox/Crisis Center/28-Day Program)? No   Name/Location of Program/Hospital:No data recorded  How Long Were You There? No data recorded  When Were You Discharged? No data recorded Have You Ever Received Services From [facility] Before? Yes   Who Do You See at [facility]? [facility].  Have You Recently Had Any Thoughts About Hurting Yourself? No   Are You Planning to Commit Suicide/Harm Yourself At This time?  No  Have you Recently Had Thoughts About Hurting Someone Karolee Ohs? No   Explanation: No data recorded Have You Used Any Alcohol or Drugs in the Past 24 Hours? No   How Long Ago Did You Use Drugs or Alcohol?  No data recorded  What Did You Use and How Much? No data recorded What Do You Feel Would Help You  the Most Today? Medication;Therapy  Do You Currently Have a Therapist/Psychiatrist? No   Name of Therapist/Psychiatrist: No data recorded  Have You Been Recently Discharged From Any Office Practice or Programs? No   Explanation of Discharge From Practice/Program:  No data recorded    CCA Screening Triage Referral Assessment Type of Contact: Face-to-Face   Is this Initial or Reassessment? No data recorded  Date Telepsych consult ordered in [facility]:  05/19/20   [facility]  1823  Patient Reported Information Reviewed? Yes   Patient Left Without Being Seen? No data recorded  Reason for Not Completing Assessment: No data recorded Collateral Involvement: No data recorded Does Patient Have a Court Appointed Legal Guardian? No data recorded  Name and Contact of Legal Guardian:  Self  If Minor and Not  Living with Parent(s), Who has Custody? No data recorded Is CPS involved or ever been involved? Never  Is APS involved or ever been involved? Never  Patient Determined To Be At Risk for Harm To Self or Others Based on Review of Patient Reported Information or Presenting Complaint? No   Method: No data recorded  Availability of Means: No data recorded  Intent: No data recorded  Notification Required: No data recorded  Additional Information for Danger to Others Potential:  No data recorded  Additional Comments for Danger to Others Potential:  No data recorded  Are There Guns or Other Weapons in Your Home?  No    Types of Guns/Weapons: No data recorded   Are These Weapons Safely Secured?                              No data recorded   Who Could Verify You Are Able To Have These Secured:    No data recorded Do You Have any Outstanding Charges, Pending Court Dates, Parole/Probation? No data recorded Contacted To Inform of Risk of Harm To Self or Others: No data recorded [facility]  Does Patient Present under Involuntary Commitment? No   IVC Papers Initial File Date: No data recorded  [place]  Patient Currently Receiving the Following Services: Medication Management;Individual Therapy   Determination of Need: Routine (7 days)   Options For Referral: Medication Management;Outpatient Therapy   Redmond Pullingreylese D Chynah Orihuela, [facility]   Redmond Pullingreylese D Shaylynne Lunt, MS, [facility], [facility] Triage Specialist [phone]

## 2020-07-23 NOTE — Discharge Instructions (Addendum)
Diet as recommended. Physical activity as recommended. Patient recommended to keep all follow-up outpatient appointments as scheduled.

## 2020-07-23 NOTE — ED Provider Notes (Signed)
Behavioral Health Urgent Care Medical Screening Exam  Patient Name: Jill Osborne MRN: [id number] Date of Evaluation: 07/23/20 Chief Complaint: Chief Complaint/Presenting Problem: Medicaition refill. Diagnosis:  Final diagnoses:  Schizoaffective disorder, unspecified type (HCC)    History of Present illness: Jill Osborne is a 19 y.o. female with a PmHx/PPsychHx significant for schizoaffective disorder and major depressive disorder who presents to [facility] with her mother. Patient states that the reason for the visit is for a prescription refill. Patient states that she is being set up with a new psychiatrist and has ran out of her Seroquel. Patient states that she needs more Seroquel and is worried about what's going to happen if she doesn't have it. Patient states that she is currently taking 400 mg of Seroquel and reports no issues. Patient denies suicide ideation or homicide ideation. Patient states she has been having some auditory hallucinations that occur 2 - 3 times per day. The auditory hallucinations are described as laughing or rude comments such as "Not supposed to have gluten."   Patient denies suicide and homicide ideation. Patient endorses auditory hallucinations. Patient states that she eats too much but doesn't report any issues with appetite. Patient states that she gets a lot of sleep and that it's restful. Patient consumes alcohol sparingly. Patient reports she last used marijuana 4 months ago.  Psychiatric Specialty Exam  Presentation  General Appearance:Appropriate for Environment  Eye Contact:Good  Speech:Clear and Coherent;Normal Rate  Speech Volume:Normal  Handedness:Right   Mood and Affect  Mood:No data recorded Affect:Appropriate   Thought Process  Thought Processes:Coherent;Goal Directed  Descriptions of Associations:Intact  Orientation:Full (Time, Place and Person)  Thought  Content:Logical  Hallucinations:Auditory Patient report having 2 - 3 auditory hallucinations. Auditory hallucinations described as laughing and or rude comments  Ideas of Reference:None  Suicidal Thoughts:No  Homicidal Thoughts:No   Sensorium  Memory:Immediate Good;Recent Good;Remote Good  Judgment:Good  Insight:Good   Executive Functions  Concentration:Good  Attention Span:Good  Recall:Good  Fund of Knowledge:Good  Language:Good   Psychomotor Activity  Psychomotor Activity:Normal   Assets  Assets:Communication Skills;Desire for Improvement;Financial Resources/Insurance;Housing;Physical Health;Social Support   Sleep  Sleep:Good  Number of hours: No data recorded  Physical Exam: Physical Exam Constitutional:      Appearance: Normal appearance.  HENT:     Head: Normocephalic and atraumatic.     Nose: Nose normal.  Eyes:     Extraocular Movements: Extraocular movements intact.     Pupils: Pupils are equal, round, and reactive to light.  Cardiovascular:     Rate and Rhythm: Normal rate and regular rhythm.  Pulmonary:     Effort: Pulmonary effort is normal.     Breath sounds: Normal breath sounds.  Musculoskeletal:        General: Normal range of motion.     Cervical back: Normal range of motion and neck supple.  Skin:    General: Skin is warm and dry.  Neurological:     General: No focal deficit present.     Mental Status: She is alert and oriented to person, place, and time.  Psychiatric:        Mood and Affect: Mood normal.        Behavior: Behavior normal.        Thought Content: Thought content normal.        Judgment: Judgment normal.    Review of Systems  Constitutional: Negative.   HENT: Negative.   Eyes: Negative.   Respiratory: Negative.  Cardiovascular: Negative.   Gastrointestinal: Negative.   Musculoskeletal: Negative.   Skin: Negative.   Neurological: Negative.   Endo/Heme/Allergies: Negative.   Psychiatric/Behavioral:  Positive for hallucinations (Auditory hallucinations). Negative for suicidal ideas.   Blood pressure 108/75, pulse 83, temperature 97.8 F (36.6 C), temperature source Tympanic, resp. rate 16, height 5' (1.524 m), weight 134 lb 3.2 oz (60.9 kg), SpO2 99 %. Body mass index is 26.21 kg/m.  Musculoskeletal: Strength & Muscle Tone: within normal limits Gait & Station: normal Patient leans: N/A   [facility] MSE Discharge Disposition for Follow up and Recommendations: Based on my evaluation the patient does not appear to have an emergency medical condition and can be discharged with resources and follow up care in outpatient services for Medication Management and Individual Therapy. Patient was discharged with a prescription for her Seroquel.   Meta Hatchet, PA 07/23/2020, 9:27 PM

## 2020-07-24 ENCOUNTER — Other Ambulatory Visit: Payer: Self-pay | Admitting: Nurse Practitioner

## 2020-07-24 MED ORDER — QUETIAPINE FUMARATE 400 MG PO TABS: 400.0000 mg | ORAL_TABLET | Freq: Every day | ORAL | 1 refills | Status: DC

## 2020-07-24 NOTE — Progress Notes (Signed)
Patient's mother called stating that pharmacy did not receive prescription for Seroquel. Per chart review it appears that prescription was sent by Karel Jarvis, PA. This provider attempted resending prescription but received error message that prescription would not be sent. Pharmacy closed so unable to contact them.

## 2020-07-25 NOTE — ED Notes (Signed)
Mother called back requesting info about call last night and progress of Rx being sent d/t her not able to call back d/t her falling asleep. Took info and passed on to NP for consideration. Advised mother to contact pharm later to inquiry on status of Rx before calling back. Mother verbalized understanding

## 2020-07-28 ENCOUNTER — Encounter: Payer: Self-pay | Admitting: Adult Health

## 2020-07-28 ENCOUNTER — Ambulatory Visit (INDEPENDENT_AMBULATORY_CARE_PROVIDER_SITE_OTHER): Payer: BC Managed Care – PPO | Admitting: Adult Health

## 2020-07-28 ENCOUNTER — Other Ambulatory Visit: Payer: Self-pay

## 2020-07-28 DIAGNOSIS — F331 Major depressive disorder, recurrent, moderate: Secondary | ICD-10-CM

## 2020-07-28 DIAGNOSIS — G4700 Insomnia, unspecified: Secondary | ICD-10-CM

## 2020-07-28 DIAGNOSIS — F4310 Post-traumatic stress disorder, unspecified: Secondary | ICD-10-CM

## 2020-07-28 DIAGNOSIS — F251 Schizoaffective disorder, depressive type: Secondary | ICD-10-CM

## 2020-07-28 DIAGNOSIS — F411 Generalized anxiety disorder: Secondary | ICD-10-CM

## 2020-07-28 MED ORDER — QUETIAPINE FUMARATE 400 MG PO TABS: 400.0000 mg | ORAL_TABLET | Freq: Every day | ORAL | 2 refills | Status: DC

## 2020-07-28 NOTE — Progress Notes (Signed)
Jill Osborne [id number] October 05, 2001 19 y.o.  Subjective:   Patient ID:  Jill Osborne is a 19 y.o. (DOB 2001/08/07) female.  Chief Complaint: No chief complaint on file.   HPI LAURIS KEEPERS presents to the office today for follow-up of Schizophrenia, MDD, GAD, PTSD, and insomnia.  Describes mood today as "ok". Flat. Denies tearfulness. Mood symptoms - denies depression. Anxious at times. More irritable at times. Stating "I have been feeling fine". Reports 2 hospitalizations since last visit - hearing voices - hallucinating. Diagnosed with Schizophrenia. Started on Lexapro 10mg  daily and Seroquel 400mg  at bedtime. Living at home with parents - looking for a job - plans to return to school in the spring. Getting along well with family. Plans to see a new therapist. Improved interest and motivation. Taking medications as prescribed.  Energy levels stable. Active, does not have a regular exercise routine.  Enjoys some usual interests and activities - paining, coloring, and dancing. Single. Lives with parents and brother 76 and sister 74. Getting out some - "every weekend".  Appetite improved. Weight gain - 10 pounds - now 134 pounds.  Sleeping well most nights. Averages 12 hours a night. Focus and concentration "good". Completing tasks. Managing aspects of household. Unemployed - looking for employment.  Denies SI or HI. Denies AH or VH. Heard voices laughing last night - "first time in a while".    AIMS     Admission (Discharged) from 05/20/2020 in [facility] INPATIENT BE[facility]AV[facility]AL MEDICINE  AIMS Total Score 0    AUDIT     Admission (Discharged) from 05/20/2020 in [facility] INPATIENT BEHAVIORAL MEDICINE Admission (Discharged) from 07/23/2019 in [facility] INPATIENT BEHAVIORAL MEDICINE  Alcohol Use Disorder Identification Test Final Score (AUDIT) 3 0       Review of Systems:  Review of Systems  Musculoskeletal: Negative for gait problem.  Neurological: Negative for tremors.  Psychiatric/Behavioral:        Please refer to HPI    Medications: I have reviewed the patient's current medications.  Current Outpatient Medications  Medication Sig Dispense Refill   QUEtiapine (SEROQUEL) 400 MG tablet Take 1 tablet (400 mg total) by mouth at bedtime. 30 tablet 2   No current facility-administered medications for this visit.    Medication Side Effects: None  Allergies:  Allergies  Allergen Reactions   Other Hives    Stone fruit   Shellfish Allergy Hives   Eggs Or Egg-Derived Products Nausea And Vomiting    Past Medical History:  Diagnosis Date   Asthma    Depression    Lumbar herniated disc    Schizophrenia (HCC)    Tetanus     Family History  Problem Relation Age of Onset   Healthy Mother    Healthy Father     Social History   Socioeconomic History   Marital status: Single    Spouse name: Not on file   Number of children: Not on file   Years of education: Not on file   Highest education level: Not on file  Occupational History   Not on file  Tobacco Use   Smoking status: Never Smoker   Smokeless tobacco: Never Used  Vaping Use   Vaping Use: Never used  Substance and Sexual Activity   Alcohol use: Yes   Drug use: Yes    Types: Marijuana   Sexual activity: Yes    Partners: Male    Birth control/protection: None, Condom  Other Topics Concern   Not on file  Social History  Narrative   Single.   Student at [employer].   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   Enjoys reading, spending time with friends.   Social Determinants of Health   Financial Resource Strain:    Difficulty of Paying Living Expenses: Not on file  Food Insecurity:    Worried About Programme researcher, broadcasting/film/video in the Last Year: Not on file   [employer] of Food in the Last Year: Not on file  Transportation Needs:    Lack of Transportation (Medical): Not on file   Lack of Transportation (Non-Medical): Not on file  Physical Activity:    Days of Exercise per Week: Not on  file   Minutes of Exercise per Session: Not on file  Stress:    Feeling of Stress : Not on file  Social Connections:    Frequency of Communication with Friends and Family: Not on file   Frequency of Social Gatherings with Friends and Family: Not on file   Attends Religious Services: Not on file   Active Member of Clubs or Organizations: Not on file   Attends Banker Meetings: Not on file   Marital Status: Not on file  Intimate Partner Violence:    Fear of Current or Ex-Partner: Not on file   Emotionally Abused: Not on file   Physically Abused: Not on file   Sexually Abused: Not on file    Past Medical History, Surgical history, Social history, and Family history were reviewed and updated as appropriate.   Please see review of systems for further details on the patient's review from today.   Objective:   Physical Exam:  There were no vitals taken for this visit.  Physical Exam Constitutional:      General: She is not in acute distress. Musculoskeletal:        General: No deformity.  Neurological:     Mental Status: She is alert and oriented to person, place, and time.     Coordination: Coordination normal.  Psychiatric:        Attention and Perception: Attention and perception normal. She does not perceive auditory or visual hallucinations.        Mood and Affect: Mood normal. Mood is not anxious or depressed. Affect is not labile, blunt, angry or inappropriate.        Speech: Speech normal.        Behavior: Behavior normal.        Thought Content: Thought content normal. Thought content is not paranoid or delusional. Thought content does not include homicidal or suicidal ideation. Thought content does not include homicidal or suicidal plan.        Cognition and Memory: Cognition and memory normal.        Judgment: Judgment normal.     Comments: Insight intact     Lab Review:     Component Value Date/Time   NA 138 05/19/2020 1750   K 3.9  05/19/2020 1750   CL 106 05/19/2020 1750   CO2 23 05/19/2020 1750   GLUCOSE 94 05/19/2020 1750   BUN 12 05/19/2020 1750   CREATININE 0.72 05/19/2020 1750   CALCIUM 9.6 05/19/2020 1750   PROT 8.3 (H) 05/19/2020 1750   ALBUMIN 5.0 05/19/2020 1750   AST 16 05/19/2020 1750   ALT 9 05/19/2020 1750   ALKPHOS 57 05/19/2020 1750   BILITOT 0.8 05/19/2020 1750   GFRNONAA >60 05/19/2020 1750   GFRAA >60 05/19/2020 1750       Component Value  Date/Time   WBC 7.8 05/19/2020 1750   RBC 4.23 05/19/2020 1750   HGB 12.9 05/19/2020 1750   HCT 38.4 05/19/2020 1750   PLT 271 05/19/2020 1750   MCV 90.8 05/19/2020 1750   MCH 30.5 05/19/2020 1750   MCHC 33.6 05/19/2020 1750   RDW 12.7 05/19/2020 1750   LYMPHSABS 1.5 01/22/2020 1228   MONOABS 0.3 01/22/2020 1228   EOSABS 0.1 01/22/2020 1228   BASOSABS 0.1 01/22/2020 1228    No results found for: POCLITH, LITHIUM   No results found for: PHENYTOIN, PHENOBARB, VALPROATE, CBMZ   .res Assessment: Plan:    Plan:  1. Seroquel 400mg  at bedtime - started while hospitalized.  2. Lexapro 10mg  daly - has refills  Will review previous collateral.   Continue therapy  RTC 4 weeks  Patient advised to contact office with any questions, adverse effects, or acute worsening in signs and symptoms.   Diagnoses and all orders for this visit:  Schizoaffective disorder, depressive type (HCC) -     QUEtiapine (SEROQUEL) 400 MG tablet; Take 1 tablet (400 mg total) by mouth at bedtime.  Generalized anxiety disorder  Insomnia, unspecified type  Major depressive disorder, recurrent episode, moderate (HCC)  PTSD (post-traumatic stress disorder)     Please see After Visit Summary for patient specific instructions.  Future Appointments  Date Time Provider Department Center  08/25/2020  4:40 PM Yogi Arther, , NP [facility] None    No orders of the defined types were placed in this encounter.   -------------------------------

## 2020-08-08 ENCOUNTER — Other Ambulatory Visit: Payer: Self-pay | Admitting: Psychiatry

## 2020-08-19 ENCOUNTER — Other Ambulatory Visit: Payer: Self-pay | Admitting: Adult Health

## 2020-08-19 DIAGNOSIS — F251 Schizoaffective disorder, depressive type: Secondary | ICD-10-CM

## 2020-08-25 ENCOUNTER — Ambulatory Visit: Payer: BC Managed Care – PPO | Admitting: Adult Health

## 2020-09-03 ENCOUNTER — Encounter: Payer: Self-pay | Admitting: Adult Health

## 2020-09-03 ENCOUNTER — Ambulatory Visit (INDEPENDENT_AMBULATORY_CARE_PROVIDER_SITE_OTHER): Payer: BC Managed Care – PPO | Admitting: Adult Health

## 2020-09-03 ENCOUNTER — Other Ambulatory Visit: Payer: Self-pay

## 2020-09-03 DIAGNOSIS — F411 Generalized anxiety disorder: Secondary | ICD-10-CM | POA: Diagnosis not present

## 2020-09-03 DIAGNOSIS — F331 Major depressive disorder, recurrent, moderate: Secondary | ICD-10-CM | POA: Diagnosis not present

## 2020-09-03 DIAGNOSIS — F251 Schizoaffective disorder, depressive type: Secondary | ICD-10-CM

## 2020-09-03 DIAGNOSIS — G4700 Insomnia, unspecified: Secondary | ICD-10-CM | POA: Diagnosis not present

## 2020-09-03 MED ORDER — ESCITALOPRAM OXALATE 10 MG PO TABS: 10.0000 mg | ORAL_TABLET | Freq: Every day | ORAL | 2 refills | Status: DC

## 2020-09-03 NOTE — Progress Notes (Signed)
Jill Osborne [id number] Nov 02, 2000 19 y.o.  Subjective:   Patient ID:  Jill Osborne is a 19 y.o. (DOB 02-20-2001) female.  Chief Complaint: No chief complaint on file.   HPI Jill Osborne presents to the office today for follow-up of Schizophrenia, MDD, GAD, PTSD, and insomnia.  Describes mood today as "ok". Flat. Denies tearfulness. Mood symptoms - denies depression or irritability. Feels anxious - "mostly at night". Stating "I'm doing alright". Feels like Seroquel and Lexapro are working well. Plans to restart therapy. Improved interest and motivation. Taking medications as prescribed. .  Energy levels stable. Active, does not have a regular exercise routine.  Enjoys some usual interests and activities. Single. Lives with parents and brother 8 and sister 11. Spending time with family. Appetite improved. Weight gain - 14 pounds.  Sleeping well most nights. Averages 9 hours a night. Focus and concentration stable. Completing tasks. Managing aspects of household. Started a job at [employer].  Denies SI or HI.  Denies AH or VH.      Review of Systems:  Review of Systems  Musculoskeletal: Negative for gait problem.  Neurological: Negative for tremors.  Psychiatric/Behavioral:       Please refer to HPI    Medications: I have reviewed the patient's current medications.  Current Outpatient Medications  Medication Sig Dispense Refill  . escitalopram (LEXAPRO) 10 MG tablet Take 1 tablet (10 mg total) by mouth daily. 30 tablet 2  . fluticasone (FLONASE) 50 MCG/ACT nasal spray Place 1 spray into both nostrils 2 (two) times daily.    . QUEtiapine (SEROQUEL) 400 MG tablet TAKE 1 TABLET BY MOUTH AT BEDTIME. 90 tablet 1   No current facility-administered medications for this visit.    Medication Side Effects: None  Allergies:  Allergies  Allergen Reactions  . Other Hives    Stone fruit  . Shellfish Allergy Hives  . Eggs Or Egg-Derived Products Nausea And Vomiting    Past  Medical History:  Diagnosis Date  . Asthma   . Depression   . Lumbar herniated disc   . Schizophrenia (HCC)   . Tetanus     Family History  Problem Relation Age of Onset  . Healthy Mother   . Healthy Father     Social History   Socioeconomic History  . Marital status: Single    Spouse name: Not on file  . Number of children: Not on file  . Years of education: Not on file  . Highest education level: Not on file  Occupational History  . Not on file  Tobacco Use  . Smoking status: Never Smoker  . Smokeless tobacco: Never Used  Vaping Use  . Vaping Use: Never used  Substance and Sexual Activity  . Alcohol use: Yes  . Drug use: Yes    Types: Marijuana  . Sexual activity: Yes    Partners: Male    Birth control/protection: None, Condom  Other Topics Concern  . Not on file  Social History Narrative   Single.   Student at [employer], [employer].   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   Enjoys reading, spending time with friends.   Social Determinants of Health   Financial Resource Strain:   . Difficulty of Paying Living Expenses: Not on file  Food Insecurity:   . Worried About Programme researcher, broadcasting/film/video in the Last Year: Not on file  . Ran Out of Food in the Last Year: Not on file  Transportation Needs:   . Lack of  Transportation (Medical): Not on file  . Lack of Transportation (Non-Medical): Not on file  Physical Activity:   . Days of Exercise per Week: Not on file  . Minutes of Exercise per Session: Not on file  Stress:   . Feeling of Stress : Not on file  Social Connections:   . Frequency of Communication with Friends and Family: Not on file  . Frequency of Social Gatherings with Friends and Family: Not on file  . Attends Religious Services: Not on file  . Active Member of Clubs or Organizations: Not on file  . Attends Banker Meetings: Not on file  . Marital Status: Not on file  Intimate Partner Violence:   . Fear of Current or Ex-Partner: Not on file  .  Emotionally Abused: Not on file  . Physically Abused: Not on file  . Sexually Abused: Not on file    Past Medical History, Surgical history, Social history, and Family history were reviewed and updated as appropriate.   Please see review of systems for further details on the patient's review from today.   Objective:   Physical Exam:  There were no vitals taken for this visit.  Physical Exam Constitutional:      General: She is not in acute distress. Musculoskeletal:        General: No deformity.  Neurological:     Mental Status: She is alert and oriented to person, place, and time.     Coordination: Coordination normal.  Psychiatric:        Attention and Perception: Attention and perception normal. She does not perceive auditory or visual hallucinations.        Mood and Affect: Mood normal. Mood is not anxious or depressed. Affect is not labile, blunt, angry or inappropriate.        Speech: Speech normal.        Behavior: Behavior normal.        Thought Content: Thought content normal. Thought content is not paranoid or delusional. Thought content does not include homicidal or suicidal ideation. Thought content does not include homicidal or suicidal plan.        Cognition and Memory: Cognition and memory normal.        Judgment: Judgment normal.     Comments: Insight intact     Lab Review:     Component Value Date/Time   NA 138 05/19/2020 1750   K 3.9 05/19/2020 1750   CL 106 05/19/2020 1750   CO2 23 05/19/2020 1750   GLUCOSE 94 05/19/2020 1750   BUN 12 05/19/2020 1750   CREATININE 0.72 05/19/2020 1750   CALCIUM 9.6 05/19/2020 1750   PROT 8.3 (H) 05/19/2020 1750   ALBUMIN 5.0 05/19/2020 1750   AST 16 05/19/2020 1750   ALT 9 05/19/2020 1750   ALKPHOS 57 05/19/2020 1750   BILITOT 0.8 05/19/2020 1750   GFRNONAA >60 05/19/2020 1750   GFRAA >60 05/19/2020 1750       Component Value Date/Time   WBC 7.8 05/19/2020 1750   RBC 4.23 05/19/2020 1750   HGB 12.9  05/19/2020 1750   HCT 38.4 05/19/2020 1750   PLT 271 05/19/2020 1750   MCV 90.8 05/19/2020 1750   MCH 30.5 05/19/2020 1750   MCHC 33.6 05/19/2020 1750   RDW 12.7 05/19/2020 1750   LYMPHSABS 1.5 01/22/2020 1228   MONOABS 0.3 01/22/2020 1228   EOSABS 0.1 01/22/2020 1228   BASOSABS 0.1 01/22/2020 1228    No results found for: POCLITH, LITHIUM  No results found for: PHENYTOIN, PHENOBARB, VALPROATE, CBMZ   .res Assessment: Plan:    Plan:  1. Seroquel 400mg  at bedtime   2. Lexapro 10mg  daly   Continue therapy  RTC 4 weeks  Patient advised to contact office with any questions, adverse effects, or acute worsening in signs and symptoms.  Diagnoses and all orders for this visit:  Generalized anxiety disorder -     escitalopram (LEXAPRO) 10 MG tablet; Take 1 tablet (10 mg total) by mouth daily.  Schizoaffective disorder, depressive type (HCC)  Major depressive disorder, recurrent episode, moderate (HCC)  Insomnia, unspecified type     Please see After Visit Summary for patient specific instructions.  No future appointments.  No orders of the defined types were placed in this encounter.   -------------------------------

## 2020-09-28 ENCOUNTER — Ambulatory Visit: Payer: BC Managed Care – PPO | Admitting: Adult Health

## 2020-10-26 ENCOUNTER — Ambulatory Visit: Payer: BC Managed Care – PPO | Admitting: Obstetrics & Gynecology

## 2020-10-26 ENCOUNTER — Other Ambulatory Visit: Payer: Self-pay

## 2020-10-26 ENCOUNTER — Encounter: Payer: Self-pay | Admitting: Radiology

## 2020-10-26 ENCOUNTER — Ambulatory Visit (INDEPENDENT_AMBULATORY_CARE_PROVIDER_SITE_OTHER): Payer: BC Managed Care – PPO | Admitting: Obstetrics & Gynecology

## 2020-10-26 VITALS — BP 116/79 | HR 102 | Wt 150.0 lb

## 2020-10-26 DIAGNOSIS — T192XXA Foreign body in vulva and vagina, initial encounter: Secondary | ICD-10-CM | POA: Diagnosis not present

## 2020-10-26 NOTE — Patient Instructions (Signed)
Pap Test Why am I having this test? A Pap test, also called a Pap smear, is a screening test to check for signs of:  Cancer of the vagina, cervix, and uterus. The cervix is the lower part of the uterus that opens into the vagina.  Infection.  Changes that may be a sign that cancer is developing (precancerous changes). Women need this test on a regular basis. In general, you should have a Pap test every 3 years until you reach menopause or age 22. Women aged 30-60 may choose to have their Pap test done at the same time as an HPV (human papillomavirus) test every 5 years (instead of every 3 years). Your health care provider may recommend having Pap tests more or less often depending on your medical conditions and past Pap test results. What kind of sample is taken? Your health care provider will collect a sample of cells from the surface of your cervix. This will be done using a small cotton swab, plastic spatula, or brush. This sample is often collected during a pelvic exam, when you are lying on your back on an exam table with feet in footrests (stirrups). In some cases, fluids (secretions) from the cervix or vagina may also be collected.   How do I prepare for this test?  Be aware of where you are in your menstrual cycle. If you are menstruating on the day of the test, you may be asked to reschedule.  You may need to reschedule if you have a known vaginal infection on the day of the test.  Follow instructions from your health care provider about: ? Changing or stopping your regular medicines. Some medicines can cause abnormal test results, such as digitalis and tetracycline. ? Avoiding douching or taking a bath the day before or the day of the test. Tell a health care provider about:  Any allergies you have.  All medicines you are taking, including vitamins, herbs, eye drops, creams, and over-the-counter medicines.  Any blood disorders you have.  Any surgeries you have had.  Any  medical conditions you have.  Whether you are pregnant or may be pregnant. How are the results reported? Your test results will be reported as either abnormal or normal. A false-positive result can occur. A false positive is incorrect because it means that a condition is present when it is not. A false-negative result can occur. A false negative is incorrect because it means that a condition is not present when it is. What do the results mean? A normal test result means that you do not have signs of cancer of the vagina, cervix, or uterus. An abnormal result may mean that you have:  Cancer. A Pap test by itself is not enough to diagnose cancer. You will have more tests done in this case.  Precancerous changes in your vagina, cervix, or uterus.  Inflammation of the cervix.  An STD (sexually transmitted disease).  A fungal infection.  A parasite infection. Talk with your health care provider about what your results mean. Questions to ask your health care provider Ask your health care provider, or the department that is doing the test:  When will my results be ready?  How will I get my results?  What are my treatment options?  What other tests do I need?  What are my next steps? Summary  In general, women should have a Pap test every 3 years until they reach menopause or age 29.  Your health care provider will collect  a sample of cells from the surface of your cervix. This will be done using a small cotton swab, plastic spatula, or brush.  In some cases, fluids (secretions) from the cervix or vagina may also be collected. This information is not intended to replace advice given to you by your health care provider. Make sure you discuss any questions you have with your health care provider. Document Revised: 06/10/2020 Document Reviewed: 06/05/2020 Elsevier Patient Education  2021 Elsevier Inc.    Preventing Cervical Cancer Cervical cancer is cancer that grows on the  cervix. The cervix is at the bottom of the uterus. It connects the uterus to the vagina. The uterus is where a baby develops during pregnancy. Cancer occurs when cells become abnormal and start to grow out of control. If cervical cancer is not found early, it can spread and become dangerous. Cervical cancer cannot always be prevented, but you can take steps to lower your risk of developing this condition. How can this condition affect me? Cervical cancer grows slowly and may not cause any symptoms at first. Over time, the cancer can grow deep into the cervix tissue and spread to other areas. This may take years, and it may happen without you knowing about it. If it is found early, cervical cancer can be treated effectively. If the cancer has grown deep into your cervix or has spread, it will be more difficult to treat. Most cases of cervical cancer are caused by an STI (sexually transmitted infection) called human papillomavirus (HPV). One way to reduce your risk of cervical cancer is to take steps to avoid infection with the HPV virus. Getting regular Pap tests is also important because this can help identify changes in cells that could lead to cancer. Your chances of getting this disease can also be reduced by making certain lifestyle changes. What can increase my risk? You are more likely to develop this condition if:  You have certain things in your sexual history, such as: ? Having a sexually transmitted viral infection. These include chlamydia and herpes. ? Having more than one sexual partner, or having sex with someone who has more than one sexual partner. ? Not using condoms during sex. ? Having been sexually active before the age of 22.  Your mother took a medicine called diethylstilbestrol (DES) while pregnant with you, causing you to be exposed to this medicine before birth.  Your mother or sister has had cervical cancer.  You are between the ages of 6140-50.  You have or have had  certain other medical conditions, such as: ? Previous cancer of the vagina or vulva. ? A weakened body defense system (immune system). ? A history of dysplasia of the cervix.  You use oral contraceptives, also called birth control pills.  You smoke or breathe in secondhand smoke. What actions can I take to prevent cervical cancer? Preventing HPV infection  Ask your health care provider about getting the HPV vaccine. If you are 22 years old or younger, you may need to get this vaccine, which is given in three doses over 6 months. This vaccine protects against the types of HPV that could cause cancer.  Limit the number of people you have sex with. Also avoid having sex with people who have had many sex partners.  Use a latex condom every time you have sex.   Getting Pap tests Get Pap tests regularly, starting at age 22. Talk with your health care provider about how often you need these tests. Having  regular Pap tests will help identify changes in cells that could lead to cancer. Steps can then be taken to prevent cancer from developing.  Most women who are 54?22 years of age should have a Pap test every 3 years.  Most women who are 65?22 years of age should have a Pap test in combination with an HPV test every 5 years.  Women with a higher risk of cervical cancer, such as those with a weakened immune system or those who were exposed to DES medicine before birth, may need more frequent testing. Making other lifestyle changes  Do not use any products that contain nicotine or tobacco, such as cigarettes, e-cigarettes, and chewing tobacco. If you need help quitting, ask your health care provider.  Eat a healthy diet that includes at least 5 servings of fruits and vegetables every day.  Lose weight if you are overweight.   Where to find support Talk with your health care provider, school nurse, or local health department for guidance about screening and vaccination. Some children and teens  may be able to get the HPV vaccine free of charge through the U.[place] government's Vaccines for Children [facility]) program. Other places that provide vaccinations include:  Public health clinics. Check with your local health department.  Federally [employer], where you would pay only what you can afford. To find one near you, check this website: http://weiss.info/  [facility]. These are part of a program for Medicare and Medicaid patients who live in rural areas. The National Breast and Cervical Cancer Early Detection Program also provides breast and cervical cancer screenings and diagnostic services to low-income, uninsured, and underinsured women. Cervical cancer can be passed down through families. Talk with your health care provider or a genetic counselor to learn more about genetic testing for cancer. Where to find more information Learn more about cervical cancer from:  [employer] of Gynecology: www.acog.org  American Cancer Society: www.cancer.org  Centers for Disease Control and Prevention: FootballExhibition.com.br Contact a health care provider if you have:  Pelvic pain.  Unusual discharge or bleeding from your vagina. Summary  Cervical cancer is cancer that grows on the cervix. The cervix is at the bottom of the uterus.  Ask your health care provider about getting the HPV vaccine.  Be sure to get regular Pap tests as recommended by your health care provider.  See your health care provider right away if you have any pelvic pain or unusual discharge or bleeding from your vagina. This information is not intended to replace advice given to you by your health care provider. Make sure you discuss any questions you have with your health care provider. Document Revised: 05/06/2019 Document Reviewed: 05/06/2019 Elsevier Patient Education  2021 Elsevier Inc.   HPV (Human Papillomavirus) Vaccine: What You Need to Know 1. Why get vaccinated? HPV (human  papillomavirus) vaccine can prevent infection with some types of human papillomavirus. HPV infections can cause certain types of cancers, including:  cervical, vaginal, and vulvar cancers in women  penile cancer in men  anal cancers in both men and women  cancers of tonsils, base of tongue, and back of throat (oropharyngeal cancer) in both men and women HPV infections can also cause anogenital warts. HPV vaccine can prevent over 90% of cancers caused by HPV. HPV is spread through intimate skin-to-skin or sexual contact. HPV infections are so common that nearly all people will get at least one type of HPV at some time in their lives. Most HPV infections  go away on their own within 2 years. But sometimes HPV infections will last longer and can cause cancers later in life. 2. HPV vaccine HPV vaccine is routinely recommended for adolescents at 20 or 22 years of age to ensure they are protected before they are exposed to the virus. HPV vaccine may be given beginning at age 76 years and vaccination is recommended for everyone through 22 years of age. HPV vaccine may be given to adults 27 through 22 years of age, based on discussions between the patient and health care provider. Most children who get the first dose before 2 years of age need 2 doses of HPV vaccine. People who get the first dose at or after 28 years of age and younger people with certain immunocompromising conditions need 3 doses. Your health care provider can give you more information. HPV vaccine may be given at the same time as other vaccines. 3. Talk with your health care provider Tell your vaccination provider if the person getting the vaccine:  Has had an allergic reaction after a previous dose of HPV vaccine, or has any severe, life-threatening allergies  Is pregnant-HPV vaccine is not recommended until after pregnancy In some cases, your health care provider may decide to postpone HPV vaccination until a future visit. People  with minor illnesses, such as a cold, may be vaccinated. People who are moderately or severely ill should usually wait until they recover before getting HPV vaccine. Your health care provider can give you more information. 4. Risks of a vaccine reaction  Soreness, redness, or swelling where the shot is given can happen after HPV vaccination.  Fever or headache can happen after HPV vaccination. People sometimes faint after medical procedures, including vaccination. Tell your provider if you feel dizzy or have vision changes or ringing in the ears. As with any medicine, there is a very remote chance of a vaccine causing a severe allergic reaction, other serious injury, or death. 5. What if there is a serious problem? An allergic reaction could occur after the vaccinated person leaves the clinic. If you see signs of a severe allergic reaction (hives, swelling of the face and throat, difficulty breathing, a fast heartbeat, dizziness, or weakness), call 9-1-1 and get the person to the nearest hospital. For other signs that concern you, call your health care provider. Adverse reactions should be reported to the Vaccine Adverse Event Reporting System (VAERS). Your health care provider will usually file this report, or you can do it yourself. Visit the VAERS website at www.vaers.LAgents.no or call (207)436-8494. VAERS is only for reporting reactions, and VAERS staff members do not give medical advice. 6. The National Vaccine Injury Compensation Program The [employer] Vaccine Injury Compensation Program (VICP) is a federal program that was created to compensate people who may have been injured by certain vaccines. Claims regarding alleged injury or death due to vaccination have a time limit for filing, which may be as short as two years. Visit the VICP website at SpiritualWord.at or call 913-761-6021 to learn about the program and about filing a claim. 7. How can I learn more?  Ask your health  care provider.  Call your local or state health department.  Visit the website of the Food and Drug Administration (FDA) for vaccine package inserts and additional information at FinderList.no.  Contact the Centers for Disease Control and Prevention (CDC): ? Call (570)156-2769 (1-800-CDC-INFO) or ? Visit CDC's website at PicCapture.uy. Vaccine Information Statement HPV Vaccine (05/22/2020) This information is not intended to  replace advice given to you by your health care provider. Make sure you discuss any questions you have with your health care provider. Document Revised: 06/30/2020 Document Reviewed: 06/30/2020 Elsevier Patient Education  2021 [employer].

## 2020-10-26 NOTE — Progress Notes (Signed)
   GYNECOLOGY OFFICE VISIT NOTE  History:   Jill Osborne is a 20 y.o. G0P0000 here today as an add-on visit due to concern about possible retained tampon since last night.  Unsure if she removed it.  No concerning symptoms. She denies any abnormal vaginal discharge, bleeding, pelvic pain or other concerns.    Past Medical History:  Diagnosis Date  . Asthma   . Depression   . Lumbar herniated disc   . Schizophrenia (HCC)   . Tetanus     Past Surgical History:  Procedure Laterality Date  . LAPAROSCOPIC APPENDECTOMY N/A 07/15/2016   Procedure: APPENDECTOMY LAPAROSCOPIC;  Surgeon: Ricarda Frame, MD;  Location: [facility] ORS;  Service: General;  Laterality: N/A;   The following portions of the patient's history were reviewed and updated as appropriate: allergies, current medications, past family history, past medical history, past social history, past surgical history and problem list.   Review of Systems:  Pertinent items noted in HPI and remainder of comprehensive ROS otherwise negative.  Physical Exam:  BP 116/79   Pulse (!) 102   Wt 150 lb (68 kg)   BMI 29.29 kg/m  CONSTITUTIONAL: Well-developed, well-nourished female in no acute distress.  CARDIOVASCULAR: Normal heart rate noted RESPIRATORY: Effort and breath sounds normal, no problems with respiration noted ABDOMEN: No masses noted. No other overt distention noted.   PELVIC: Normal appearing external genitalia; normal urethral meatus; normal appearing distal vaginal mucosa. Bimanual done, no tampon palpated on extensive evaluation. IUD strings palpated. Had brown discharge noted on gloves after examination. Normal uterine size, no other palpable masses, no uterine or adnexal tenderness. Performed in the presence of a chaperone.     Assessment and Plan:    1. Retained tampon not found on examination Patient reassured about examination findings. She has never had a pap smear, reviewed need for this with her. Information given  to her to review at home. Also told her to find out if she had received HPV vaccine series, information also given to her.  Routine preventative health maintenance measures emphasized. Please refer to After Visit Summary for other counseling recommendations.   Return for Annual exam and first pap smear soon.    I spent 10 minutes dedicated to the care of this patient including pre-visit review of records, face to face time with the patient discussing her conditions and treatments and post visit ordering of testing.    Jaynie Collins, MD, FACOG Obstetrician & Gynecologist, [facility] for [employer], [facility]

## 2020-12-13 ENCOUNTER — Other Ambulatory Visit: Payer: Self-pay | Admitting: Adult Health

## 2020-12-13 DIAGNOSIS — F411 Generalized anxiety disorder: Secondary | ICD-10-CM

## 2021-01-06 ENCOUNTER — Other Ambulatory Visit: Payer: Self-pay | Admitting: Adult Health

## 2021-01-06 DIAGNOSIS — F411 Generalized anxiety disorder: Secondary | ICD-10-CM

## 2021-01-06 NOTE — Telephone Encounter (Signed)
Please schedule appt

## 2021-01-07 NOTE — Telephone Encounter (Signed)
Has appointment for 02/10/21.

## 2021-02-07 ENCOUNTER — Other Ambulatory Visit: Payer: Self-pay | Admitting: Adult Health

## 2021-02-07 DIAGNOSIS — F251 Schizoaffective disorder, depressive type: Secondary | ICD-10-CM

## 2021-02-10 ENCOUNTER — Encounter: Payer: Self-pay | Admitting: Adult Health

## 2021-02-10 ENCOUNTER — Other Ambulatory Visit: Payer: Self-pay

## 2021-02-10 ENCOUNTER — Ambulatory Visit (INDEPENDENT_AMBULATORY_CARE_PROVIDER_SITE_OTHER): Payer: BC Managed Care – PPO | Admitting: Adult Health

## 2021-02-10 DIAGNOSIS — F411 Generalized anxiety disorder: Secondary | ICD-10-CM | POA: Diagnosis not present

## 2021-02-10 DIAGNOSIS — F251 Schizoaffective disorder, depressive type: Secondary | ICD-10-CM | POA: Diagnosis not present

## 2021-02-10 DIAGNOSIS — F4310 Post-traumatic stress disorder, unspecified: Secondary | ICD-10-CM

## 2021-02-10 DIAGNOSIS — G4700 Insomnia, unspecified: Secondary | ICD-10-CM | POA: Diagnosis not present

## 2021-02-10 DIAGNOSIS — F331 Major depressive disorder, recurrent, moderate: Secondary | ICD-10-CM

## 2021-02-10 MED ORDER — QUETIAPINE FUMARATE 400 MG PO TABS: ORAL_TABLET | ORAL | 0 refills | Status: DC

## 2021-02-10 MED ORDER — ESCITALOPRAM OXALATE 10 MG PO TABS: 1.0000 | ORAL_TABLET | Freq: Every day | ORAL | 0 refills | Status: DC

## 2021-02-10 NOTE — Progress Notes (Signed)
CECI TALIAFERRO [id number] [birth year] 20 y.o.  Subjective:   Patient ID:  Jill Osborne is a 20 y.o. (DOB December 20, 2000) female.  Chief Complaint: No chief complaint on file.   HPI Jill Osborne presents to the office today for follow-up of Schizophrenia, MDD, GAD, PTSD, and insomnia.  Describes mood today as "ok". Pleasant. Denies tearfulness. Mood symptoms - denies depression or irritability. Feels anxious - "more so at night - I feel scared". Reports 2 out of 7 days that she has a hallucination. Stating "I feel like I'm getting better". Does not want to make any medication changes - stating "I'm comforatble with where things are". Feels like Seroquel and Lexapro are working well. Planning to move in with a friend now that she is working full time and able afford it. Improved interest and motivation. Taking medications as prescribed.  Energy levels stable. Active, does not have a regular exercise routine.  Enjoys some usual interests and activities. Single. Lives with parents and 2 siblings. Spending time with family. Appetite improved. Weight gain - 155 pounds.  Sleeping well most nights. Averages 9 or more hours a night. Focus and concentration stable. Completing tasks. Managing aspects of household. Working full time at [employer].  Denies SI or HI.  Denies AH or VH.         Flowsheet Row ED from 05/19/2020 in [facility]PART[facility] ED from 07/22/2019 in [facility]  C-SSRS RISK CATEGORY No Risk Moderate Risk       Review of Systems:  Review of Systems  Musculoskeletal: Negative for gait problem.  Neurological: Negative for tremors.  Psychiatric/Behavioral:       Please refer to HPI    Medications: I have reviewed the patient's current medications. PRN:  Current Outpatient Medications  Medication Sig Dispense Refill  . escitalopram (LEXAPRO) 10 MG tablet Take 1 tablet (10 mg total) by mouth daily. 90  tablet 0  . fluticasone (FLONASE) 50 MCG/ACT nasal spray Place 1 spray into both nostrils 2 (two) times daily.    . QUEtiapine (SEROQUEL) 400 MG tablet TAKE 1 TABLET BY MOUTH EVERYDAY AT BEDTIME 90 tablet 0   No current facility-administered medications for this visit.    Medication Side Effects: None  Allergies:  Allergies  Allergen Reactions  . Other Hives    Stone fruit  . Shellfish Allergy Hives  . Eggs Or Egg-Derived Products Nausea And Vomiting    Past Medical History:  Diagnosis Date  . Asthma   . Depression   . Lumbar herniated disc   . Schizophrenia (HCC)   . Tetanus     Past Medical History, Surgical history, Social history, and Family history were reviewed and updated as appropriate.   Please see review of systems for further details on the patient's review from today.   Objective:   Physical Exam:  There were no vitals taken for this visit.  Physical Exam Constitutional:      General: She is not in acute distress. Musculoskeletal:        General: No deformity.  Neurological:     Mental Status: She is alert and oriented to person, place, and time.     Coordination: Coordination normal.  Psychiatric:        Attention and Perception: Attention and perception normal. She does not perceive auditory or visual hallucinations.        Mood and Affect: Mood normal. Mood is not anxious or depressed. Affect is not  labile, blunt, angry or inappropriate.        Speech: Speech normal.        Behavior: Behavior normal.        Thought Content: Thought content normal. Thought content is not paranoid or delusional. Thought content does not include homicidal or suicidal ideation. Thought content does not include homicidal or suicidal plan.        Cognition and Memory: Cognition and memory normal.        Judgment: Judgment normal.     Comments: Insight intact     Lab Review:     Component Value Date/Time   NA 138 05/19/2020 1750   K 3.9 05/19/2020 1750   CL 106  05/19/2020 1750   CO2 23 05/19/2020 1750   GLUCOSE 94 05/19/2020 1750   BUN 12 05/19/2020 1750   CREATININE 0.72 05/19/2020 1750   CALCIUM 9.6 05/19/2020 1750   PROT 8.3 (H) 05/19/2020 1750   ALBUMIN 5.0 05/19/2020 1750   AST 16 05/19/2020 1750   ALT 9 05/19/2020 1750   ALKPHOS 57 05/19/2020 1750   BILITOT 0.8 05/19/2020 1750   GFRNONAA >60 05/19/2020 1750   GFRAA >60 05/19/2020 1750       Component Value Date/Time   WBC 7.8 05/19/2020 1750   RBC 4.23 05/19/2020 1750   HGB 12.9 05/19/2020 1750   HCT 38.4 05/19/2020 1750   PLT 271 05/19/2020 1750   MCV 90.8 05/19/2020 1750   MCH 30.5 05/19/2020 1750   MCHC 33.6 05/19/2020 1750   RDW 12.7 05/19/2020 1750   LYMPHSABS 1.5 01/22/2020 1228   MONOABS 0.3 01/22/2020 1228   EOSABS 0.1 01/22/2020 1228   BASOSABS 0.1 01/22/2020 1228    No results found for: POCLITH, LITHIUM   No results found for: PHENYTOIN, PHENOBARB, VALPROATE, CBMZ   .res Assessment: Plan:     Plan:  1. Seroquel 400mg  at bedtime   2. Lexapro 10mg  daly   Continue therapy  RTC 4 weeks  Patient advised to contact office with any questions, adverse effects, or acute worsening in signs and symptoms.  Discussed potential metabolic side effects associated with atypical antipsychotics, as well as potential risk for movement side effects. Advised pt to contact office if movement side effects occur.    Diagnoses and all orders for this visit:  PTSD (post-traumatic stress disorder)  Generalized anxiety disorder -     escitalopram (LEXAPRO) 10 MG tablet; Take 1 tablet (10 mg total) by mouth daily.  Schizoaffective disorder, depressive type (HCC) -     QUEtiapine (SEROQUEL) 400 MG tablet; TAKE 1 TABLET BY MOUTH EVERYDAY AT BEDTIME  Insomnia, unspecified type  Major depressive disorder, recurrent episode, moderate (HCC)     Please see After Visit Summary for patient specific instructions.  Future Appointments  Date Time Provider Department Center   05/12/2021  2:40 PM Calub Tarnow, , NP [facility] None    No orders of the defined types were placed in this encounter.   -------------------------------

## 2021-05-04 ENCOUNTER — Other Ambulatory Visit: Payer: Self-pay | Admitting: Adult Health

## 2021-05-04 DIAGNOSIS — F411 Generalized anxiety disorder: Secondary | ICD-10-CM

## 2021-05-12 ENCOUNTER — Other Ambulatory Visit: Payer: Self-pay

## 2021-05-12 ENCOUNTER — Encounter: Payer: Self-pay | Admitting: Adult Health

## 2021-05-12 ENCOUNTER — Ambulatory Visit (INDEPENDENT_AMBULATORY_CARE_PROVIDER_SITE_OTHER): Payer: BC Managed Care – PPO | Admitting: Adult Health

## 2021-05-12 DIAGNOSIS — F411 Generalized anxiety disorder: Secondary | ICD-10-CM

## 2021-05-12 DIAGNOSIS — F251 Schizoaffective disorder, depressive type: Secondary | ICD-10-CM | POA: Diagnosis not present

## 2021-05-12 MED ORDER — ESCITALOPRAM OXALATE 10 MG PO TABS: 10.0000 mg | ORAL_TABLET | Freq: Every day | ORAL | 1 refills | Status: DC

## 2021-05-12 MED ORDER — QUETIAPINE FUMARATE 400 MG PO TABS: ORAL_TABLET | ORAL | 1 refills | Status: DC

## 2021-05-12 NOTE — Progress Notes (Signed)
Jill Osborne [id number] 2001/04/24 20 y.o.  Subjective:   Patient ID:  Jill Osborne is a 20 y.o. (DOB Mar 17, 2001) female.  Chief Complaint: No chief complaint on file.   HPI THETA LEAF presents to the office today for follow-up of Schizophrenia, MDD, GAD, PTSD, and insomnia.  Describes mood today as "ok". Pleasant. Denies tearfulness - "not often". Mood symptoms - denies depression, anxiety and irritability. No longer feeling afraid. Stating "I'm doing pretty good most of the time". Feels like medication is working well for her - forgetting occasionally. Feels like Seroquel and Lexapro continue to work well. Planning to move in with a friend now that she is working full time and able afford it. Improved interest and motivation. Taking medications as prescribed.  Energy levels stable. Active, does not have a regular exercise routine.  Enjoys some usual interests and activities. Single. Lives with parents and 2 siblings. Spending time with family. Appetite stable. Weight gain - 155 pounds.  Sleeping well most nights. Averages 9 hours. Focus and concentration stable. Completing tasks. Managing aspects of household. Working full time at [employer].  Denies SI or HI.  Denies AH or VH.          Flowsheet Row ED from 05/19/2020 in [facility]PART[facility] ED from 07/22/2019 in [facility]  C-SSRS RISK CATEGORY No Risk Moderate Risk        Review of Systems:  Review of Systems  Musculoskeletal:  Negative for gait problem.  Neurological:  Negative for tremors.  Psychiatric/Behavioral:         Please refer to HPI   Medications: I have reviewed the patient's current medications.  Current Outpatient Medications  Medication Sig Dispense Refill   escitalopram (LEXAPRO) 10 MG tablet Take 1 tablet (10 mg total) by mouth daily. 90 tablet 1   fluticasone (FLONASE) 50 MCG/ACT nasal spray Place 1 spray into both nostrils  2 (two) times daily.     QUEtiapine (SEROQUEL) 400 MG tablet TAKE 1 TABLET BY MOUTH EVERYDAY AT BEDTIME 90 tablet 1   No current facility-administered medications for this visit.    Medication Side Effects: None  Allergies:  Allergies  Allergen Reactions   Other Hives    Stone fruit   Shellfish Allergy Hives   Eggs Or Egg-Derived Products Nausea And Vomiting    Past Medical History:  Diagnosis Date   Asthma    Depression    Lumbar herniated disc    Schizophrenia (HCC)    Tetanus     Past Medical History, Surgical history, Social history, and Family history were reviewed and updated as appropriate.   Please see review of systems for further details on the patient's review from today.   Objective:   Physical Exam:  There were no vitals taken for this visit.  Physical Exam Constitutional:      General: She is not in acute distress. Musculoskeletal:        General: No deformity.  Neurological:     Mental Status: She is alert and oriented to person, place, and time.     Coordination: Coordination normal.  Psychiatric:        Attention and Perception: Attention and perception normal. She does not perceive auditory or visual hallucinations.        Mood and Affect: Mood normal. Mood is not anxious or depressed. Affect is not labile, blunt, angry or inappropriate.        Speech: Speech normal.  Behavior: Behavior normal.        Thought Content: Thought content normal. Thought content is not paranoid or delusional. Thought content does not include homicidal or suicidal ideation. Thought content does not include homicidal or suicidal plan.        Cognition and Memory: Cognition and memory normal.        Judgment: Judgment normal.     Comments: Insight intact    Lab Review:     Component Value Date/Time   NA 138 05/19/2020 1750   K 3.9 05/19/2020 1750   CL 106 05/19/2020 1750   CO2 23 05/19/2020 1750   GLUCOSE 94 05/19/2020 1750   BUN 12 05/19/2020 1750    CREATININE 0.72 05/19/2020 1750   CALCIUM 9.6 05/19/2020 1750   PROT 8.3 (H) 05/19/2020 1750   ALBUMIN 5.0 05/19/2020 1750   AST 16 05/19/2020 1750   ALT 9 05/19/2020 1750   ALKPHOS 57 05/19/2020 1750   BILITOT 0.8 05/19/2020 1750   GFRNONAA >60 05/19/2020 1750   GFRAA >60 05/19/2020 1750       Component Value Date/Time   WBC 7.8 05/19/2020 1750   RBC 4.23 05/19/2020 1750   HGB 12.9 05/19/2020 1750   HCT 38.4 05/19/2020 1750   PLT 271 05/19/2020 1750   MCV 90.8 05/19/2020 1750   MCH 30.5 05/19/2020 1750   MCHC 33.6 05/19/2020 1750   RDW 12.7 05/19/2020 1750   LYMPHSABS 1.5 01/22/2020 1228   MONOABS 0.3 01/22/2020 1228   EOSABS 0.1 01/22/2020 1228   BASOSABS 0.1 01/22/2020 1228    No results found for: POCLITH, LITHIUM   No results found for: PHENYTOIN, PHENOBARB, VALPROATE, CBMZ   .res Assessment: Plan:    Plan:  1. Seroquel 400mg  at bedtime   2. Lexapro 10mg  daly   Continue therapy  RTC 3 months  Patient advised to contact office with any questions, adverse effects, or acute worsening in signs and symptoms.  Discussed potential metabolic side effects associated with atypical antipsychotics, as well as potential risk for movement side effects. Advised pt to contact office if movement side effects occur.   Diagnoses and all orders for this visit:  Generalized anxiety disorder -     escitalopram (LEXAPRO) 10 MG tablet; Take 1 tablet (10 mg total) by mouth daily.  Schizoaffective disorder, depressive type (HCC) -     QUEtiapine (SEROQUEL) 400 MG tablet; TAKE 1 TABLET BY MOUTH EVERYDAY AT BEDTIME    Please see After Visit Summary for patient specific instructions.  No future appointments.  No orders of the defined types were placed in this encounter.   -------------------------------

## 2021-08-02 ENCOUNTER — Other Ambulatory Visit: Payer: Self-pay | Admitting: Adult Health

## 2021-08-02 DIAGNOSIS — F251 Schizoaffective disorder, depressive type: Secondary | ICD-10-CM

## 2021-08-12 ENCOUNTER — Ambulatory Visit: Payer: BC Managed Care – PPO | Admitting: Adult Health

## 2021-08-12 NOTE — Progress Notes (Signed)
Patient no show appointment. ? ?

## 2021-08-28 ENCOUNTER — Other Ambulatory Visit: Payer: Self-pay | Admitting: Adult Health

## 2021-08-28 DIAGNOSIS — F411 Generalized anxiety disorder: Secondary | ICD-10-CM

## 2021-08-30 NOTE — Telephone Encounter (Signed)
Pt has an appt on thursday 

## 2021-09-02 ENCOUNTER — Ambulatory Visit (INDEPENDENT_AMBULATORY_CARE_PROVIDER_SITE_OTHER): Payer: 59 | Admitting: Adult Health

## 2021-09-02 ENCOUNTER — Other Ambulatory Visit: Payer: Self-pay

## 2021-09-02 ENCOUNTER — Encounter: Payer: Self-pay | Admitting: Adult Health

## 2021-09-02 DIAGNOSIS — F4310 Post-traumatic stress disorder, unspecified: Secondary | ICD-10-CM | POA: Diagnosis not present

## 2021-09-02 DIAGNOSIS — F411 Generalized anxiety disorder: Secondary | ICD-10-CM

## 2021-09-02 DIAGNOSIS — F332 Major depressive disorder, recurrent severe without psychotic features: Secondary | ICD-10-CM | POA: Diagnosis not present

## 2021-09-02 DIAGNOSIS — F251 Schizoaffective disorder, depressive type: Secondary | ICD-10-CM | POA: Diagnosis not present

## 2021-09-02 MED ORDER — ESCITALOPRAM OXALATE 10 MG PO TABS: 10.0000 mg | ORAL_TABLET | Freq: Every day | ORAL | 1 refills | Status: DC

## 2021-09-02 MED ORDER — QUETIAPINE FUMARATE 400 MG PO TABS: ORAL_TABLET | ORAL | 1 refills | Status: DC

## 2021-09-02 NOTE — Progress Notes (Signed)
[name] [id number] 2001-01-31 20 y.o.  Subjective:   Patient ID:  Jill Osborne is a 20 y.o. (DOB 12/17/2000) female.  Chief Complaint: No chief complaint on file.   HPI Jill Osborne presents to the office today for follow-up of Schizophrenia, MDD, GAD, PTSD, and insomnia.  Describes mood today as "ok". Pleasant. Denies tearfulness. Mood symptoms - denies depression, anxiety and irritability. Reports a period of time 2 to 3 weeks ago where she was feeling angry all day - "it was really strange". Stating "I feel like I'm doing alright". Feels like medications - Seroquel and Lexapro continue to work well. Improved interest and motivation. Taking medications as prescribed.  Energy levels stable. Active, has a regular exercise routine. Going to the gym. Enjoys some usual interests and activities. Single. Has a boyfriend. Lives with parents and 2 siblings. Spending time with family. Appetite stable. Weight loss - 4 pounds - 155 to 151 pounds.  Sleeping well most nights. Averages 9 hours. Focus and concentration stable. Completing tasks. Managing aspects of household. Working full time at [employer].  Denies SI or HI.  Denies AH or VH.       [place] ED from 05/19/2020 in [place] ED from 07/22/2019 in [place]  C-SSRS RISK CATEGORY No Risk Moderate Risk        Review of Systems:  Review of Systems  Musculoskeletal:  Negative for gait problem.  Neurological:  Negative for tremors.  Psychiatric/Behavioral:         Please refer to HPI   Medications: I have reviewed the patient's current medications.  Current Outpatient Medications  Medication Sig Dispense Refill   escitalopram (LEXAPRO) 10 MG tablet Take 1 tablet (10 mg total) by mouth daily. 90 tablet 1   fluticasone (FLONASE) 50 MCG/ACT nasal spray Place 1 spray into both nostrils 2 (two) times daily.     QUEtiapine (SEROQUEL) 400 MG  tablet TAKE 1 TABLET BY MOUTH EVERYDAY AT BEDTIME 90 tablet 1   No current facility-administered medications for this visit.    Medication Side Effects: None  Allergies:  Allergies  Allergen Reactions   Other Hives    Stone fruit   Shellfish Allergy Hives   Eggs Or Egg-Derived Products Nausea And Vomiting    Past Medical History:  Diagnosis Date   Asthma    Depression    Lumbar herniated disc    Schizophrenia ([place])    Tetanus     Past Medical History, Surgical history, Social history, and Family history were reviewed and updated as appropriate.   Please see review of systems for further details on the patient's review from today.   Objective:   Physical Exam:  There were no vitals taken for this visit.  Physical Exam Constitutional:      General: She is not in acute distress. Musculoskeletal:        General: No deformity.  Neurological:     Mental Status: She is alert and oriented to person, place, and time.     Coordination: Coordination normal.  Psychiatric:        Attention and Perception: Attention and perception normal. She does not perceive auditory or visual hallucinations.        Mood and Affect: Mood normal. Mood is not anxious or depressed. Affect is not labile, blunt, angry or inappropriate.        Speech: Speech normal.        Behavior: Behavior  normal.        Thought Content: Thought content normal. Thought content is not paranoid or delusional. Thought content does not include homicidal or suicidal ideation. Thought content does not include homicidal or suicidal plan.        Cognition and Memory: Cognition and memory normal.        Judgment: Judgment normal.     Comments: Insight intact    Lab Review:     Component Value Date/Time   NA 138 05/19/2020 1750   K 3.9 05/19/2020 1750   CL 106 05/19/2020 1750   CO2 23 05/19/2020 1750   GLUCOSE 94 05/19/2020 1750   BUN 12 05/19/2020 1750   CREATININE 0.72 05/19/2020 1750   CALCIUM 9.6 05/19/2020  1750   PROT 8.3 (H) 05/19/2020 1750   ALBUMIN 5.0 05/19/2020 1750   AST 16 05/19/2020 1750   ALT 9 05/19/2020 1750   ALKPHOS 57 05/19/2020 1750   BILITOT 0.8 05/19/2020 1750   GFRNONAA >60 05/19/2020 1750   GFRAA >60 05/19/2020 1750       Component Value Date/Time   WBC 7.8 05/19/2020 1750   RBC 4.23 05/19/2020 1750   HGB 12.9 05/19/2020 1750   HCT 38.4 05/19/2020 1750   PLT 271 05/19/2020 1750   MCV 90.8 05/19/2020 1750   MCH 30.5 05/19/2020 1750   MCHC 33.6 05/19/2020 1750   RDW 12.7 05/19/2020 1750   LYMPHSABS 1.5 01/22/2020 1228   MONOABS 0.3 01/22/2020 1228   EOSABS 0.1 01/22/2020 1228   BASOSABS 0.1 01/22/2020 1228    No results found for: POCLITH, LITHIUM   No results found for: PHENYTOIN, PHENOBARB, VALPROATE, CBMZ   .res Assessment: Plan:    Plan:  1. Seroquel 400mg  at bedtime   2. Lexapro 10mg  daly   Continue therapy  RTC 6 months  Patient advised to contact office with any questions, adverse effects, or acute worsening in signs and symptoms.  Discussed potential metabolic side effects associated with atypical antipsychotics, as well as potential risk for movement side effects. Advised pt to contact office if movement side effects occur.   Diagnoses and all orders for this visit:  MDD (major depressive disorder), recurrent severe, without psychosis (HCC)  Generalized anxiety disorder -     escitalopram (LEXAPRO) 10 MG tablet; Take 1 tablet (10 mg total) by mouth daily.  Schizoaffective disorder, depressive type (HCC) -     QUEtiapine (SEROQUEL) 400 MG tablet; TAKE 1 TABLET BY MOUTH EVERYDAY AT BEDTIME  PTSD (post-traumatic stress disorder)    Please see After Visit Summary for patient specific instructions.  No future appointments.  No orders of the defined types were placed in this encounter.   -------------------------------

## 2022-03-02 ENCOUNTER — Ambulatory Visit: Payer: 59 | Admitting: Adult Health

## 2022-03-28 ENCOUNTER — Ambulatory Visit (INDEPENDENT_AMBULATORY_CARE_PROVIDER_SITE_OTHER): Payer: Self-pay | Admitting: Adult Health

## 2022-03-28 DIAGNOSIS — Z91199 Patient's noncompliance with other medical treatment and regimen due to unspecified reason: Secondary | ICD-10-CM

## 2022-03-28 NOTE — Progress Notes (Signed)
Patient no show appointment. ? ?

## 2022-04-10 ENCOUNTER — Other Ambulatory Visit: Payer: Self-pay | Admitting: Adult Health

## 2022-04-10 DIAGNOSIS — F411 Generalized anxiety disorder: Secondary | ICD-10-CM

## 2022-04-11 NOTE — Telephone Encounter (Signed)
Lvm for patient to call and schedule 

## 2022-04-14 ENCOUNTER — Telehealth: Payer: Self-pay | Admitting: Adult Health

## 2022-04-14 ENCOUNTER — Other Ambulatory Visit: Payer: Self-pay

## 2022-04-14 DIAGNOSIS — F251 Schizoaffective disorder, depressive type: Secondary | ICD-10-CM

## 2022-04-14 DIAGNOSIS — F411 Generalized anxiety disorder: Secondary | ICD-10-CM

## 2022-04-14 MED ORDER — QUETIAPINE FUMARATE 400 MG PO TABS: ORAL_TABLET | ORAL | 0 refills | Status: DC

## 2022-04-14 MED ORDER — ESCITALOPRAM OXALATE 10 MG PO TABS: 10.0000 mg | ORAL_TABLET | Freq: Every day | ORAL | 0 refills | Status: DC

## 2022-04-14 NOTE — Telephone Encounter (Signed)
Next visit is 05/17/22. Requesting a refill on Seroquel and Lexapro called to:  [facility] [id number] [place], [place] - [phone]  Phone:  [phone]  Fax:  [phone]

## 2022-04-14 NOTE — Telephone Encounter (Signed)
Rx sent 

## 2022-05-17 ENCOUNTER — Ambulatory Visit (INDEPENDENT_AMBULATORY_CARE_PROVIDER_SITE_OTHER): Payer: 59 | Admitting: Adult Health

## 2022-05-17 ENCOUNTER — Encounter: Payer: Self-pay | Admitting: Adult Health

## 2022-05-17 DIAGNOSIS — F332 Major depressive disorder, recurrent severe without psychotic features: Secondary | ICD-10-CM | POA: Diagnosis not present

## 2022-05-17 DIAGNOSIS — F411 Generalized anxiety disorder: Secondary | ICD-10-CM | POA: Diagnosis not present

## 2022-05-17 DIAGNOSIS — F251 Schizoaffective disorder, depressive type: Secondary | ICD-10-CM | POA: Diagnosis not present

## 2022-05-17 DIAGNOSIS — R69 Illness, unspecified: Secondary | ICD-10-CM | POA: Diagnosis not present

## 2022-05-17 DIAGNOSIS — F4310 Post-traumatic stress disorder, unspecified: Secondary | ICD-10-CM

## 2022-05-17 NOTE — Progress Notes (Signed)
KARMON ANDIS [id number] 2001-09-20 20 y.o.  Subjective:   Patient ID:  Jill Osborne is a 21 y.o. (DOB Dec 27, 2000) female.  Chief Complaint: No chief complaint on file.   HPI Jill Osborne presents to the office today for follow-up of Schizophrenia, MDD, GAD, PTSD, and insomnia.  Describes mood today as "ok". Pleasant. Denies tearfulness. Mood symptoms - denies depression, anxiety and irritability. Mood is mostly consistent. Stating "I feel annoyed at times - more so in work setting". Feels like medications - Seroquel and Lexapro continue to work well. Improved interest and motivation. Taking medications as prescribed.  Energy levels stable. Active, has a regular exercise routine. Going to the gym. Enjoys some usual interests and activities. Single. Has a boyfriend x 1 year. Lives with parents and 2 siblings. Spending time with family. Appetite stable. Weight gain - 155 pounds.  Sleeping well most nights. Averages 9 hours. Focus and concentration stable. Completing tasks. Managing aspects of household. Working full time at [employer].  Denies SI or HI.  Positive for AH - "every day, but not a lot". Denies VH.        Flowsheet Row ED from 05/19/2020 in [facility]Y DEPARTMENT ED from 07/22/2019 in [facility]  C-SSRS RISK CATEGORY No Risk Moderate Risk        Review of Systems:  Review of Systems  Musculoskeletal:  Negative for gait problem.  Neurological:  Negative for tremors.  Psychiatric/Behavioral:         Please refer to HPI    Medications: I have reviewed the patient's current medications.  Current Outpatient Medications  Medication Sig Dispense Refill   escitalopram (LEXAPRO) 10 MG tablet Take 1 tablet (10 mg total) by mouth daily. 90 tablet 0   fluticasone (FLONASE) 50 MCG/ACT nasal spray Place 1 spray into both nostrils 2 (two) times daily.     QUEtiapine (SEROQUEL) 400 MG tablet TAKE 1 TABLET  BY MOUTH EVERYDAY AT BEDTIME 90 tablet 0   No current facility-administered medications for this visit.    Medication Side Effects: None  Allergies:  Allergies  Allergen Reactions   Other Hives    Stone fruit   Shellfish Allergy Hives   Eggs Or Egg-Derived Products Nausea And Vomiting    Past Medical History:  Diagnosis Date   Asthma    Depression    Lumbar herniated disc    Schizophrenia (HCC)    Tetanus     Past Medical History, Surgical history, Social history, and Family history were reviewed and updated as appropriate.   Please see review of systems for further details on the patient's review from today.   Objective:   Physical Exam:  There were no vitals taken for this visit.  Physical Exam Constitutional:      General: She is not in acute distress. Musculoskeletal:        General: No deformity.  Neurological:     Mental Status: She is alert and oriented to person, place, and time.     Coordination: Coordination normal.  Psychiatric:        Attention and Perception: Attention and perception normal. She does not perceive auditory or visual hallucinations.        Mood and Affect: Mood normal. Mood is not anxious or depressed. Affect is not labile, blunt, angry or inappropriate.        Speech: Speech normal.        Behavior: Behavior normal.  Thought Content: Thought content normal. Thought content is not paranoid or delusional. Thought content does not include homicidal or suicidal ideation. Thought content does not include homicidal or suicidal plan.        Cognition and Memory: Cognition and memory normal.        Judgment: Judgment normal.     Comments: Insight intact     Lab Review:     Component Value Date/Time   NA 138 05/19/2020 1750   K 3.9 05/19/2020 1750   CL 106 05/19/2020 1750   CO2 23 05/19/2020 1750   GLUCOSE 94 05/19/2020 1750   BUN 12 05/19/2020 1750   CREATININE 0.72 05/19/2020 1750   CALCIUM 9.6 05/19/2020 1750   PROT 8.3 (H)  05/19/2020 1750   ALBUMIN 5.0 05/19/2020 1750   AST 16 05/19/2020 1750   ALT 9 05/19/2020 1750   ALKPHOS 57 05/19/2020 1750   BILITOT 0.8 05/19/2020 1750   GFRNONAA >60 05/19/2020 1750   GFRAA >60 05/19/2020 1750       Component Value Date/Time   WBC 7.8 05/19/2020 1750   RBC 4.23 05/19/2020 1750   HGB 12.9 05/19/2020 1750   HCT 38.4 05/19/2020 1750   PLT 271 05/19/2020 1750   MCV 90.8 05/19/2020 1750   MCH 30.5 05/19/2020 1750   MCHC 33.6 05/19/2020 1750   RDW 12.7 05/19/2020 1750   LYMPHSABS 1.5 01/22/2020 1228   MONOABS 0.3 01/22/2020 1228   EOSABS 0.1 01/22/2020 1228   BASOSABS 0.1 01/22/2020 1228    No results found for: "POCLITH", "LITHIUM"   No results found for: "PHENYTOIN", "PHENOBARB", "VALPROATE", "CBMZ"   .res Assessment: Plan:    Plan:  1. Seroquel 400mg  at bedtime   2. Lexapro 10mg  daly   Continue therapy  RTC 6 months  Patient advised to contact office with any questions, adverse effects, or acute worsening in signs and symptoms.  Discussed potential metabolic side effects associated with atypical antipsychotics, as well as potential risk for movement side effects. Advised pt to contact office if movement side effects occur.   Diagnoses and all orders for this visit:  Schizoaffective disorder, depressive type (HCC)  PTSD (post-traumatic stress disorder)  MDD (major depressive disorder), recurrent severe, without psychosis (HCC)  Generalized anxiety disorder     Please see After Visit Summary for patient specific instructions.  Future Appointments  Date Time Provider Department Center  05/27/2022  2:20 PM , NP [facility]    No orders of the defined types were placed in this encounter.   -------------------------------

## 2022-05-27 ENCOUNTER — Ambulatory Visit (INDEPENDENT_AMBULATORY_CARE_PROVIDER_SITE_OTHER): Payer: 59 | Admitting: Primary Care

## 2022-05-27 ENCOUNTER — Encounter: Payer: Self-pay | Admitting: Primary Care

## 2022-05-27 VITALS — BP 110/62 | HR 87 | Temp 98.6°F | Ht 60.0 in | Wt 163.0 lb

## 2022-05-27 DIAGNOSIS — F3341 Major depressive disorder, recurrent, in partial remission: Secondary | ICD-10-CM | POA: Diagnosis not present

## 2022-05-27 DIAGNOSIS — F251 Schizoaffective disorder, depressive type: Secondary | ICD-10-CM

## 2022-05-27 DIAGNOSIS — R69 Illness, unspecified: Secondary | ICD-10-CM | POA: Diagnosis not present

## 2022-05-27 DIAGNOSIS — Z0000 Encounter for general adult medical examination without abnormal findings: Secondary | ICD-10-CM | POA: Diagnosis not present

## 2022-05-27 DIAGNOSIS — R440 Auditory hallucinations: Secondary | ICD-10-CM | POA: Diagnosis not present

## 2022-05-27 NOTE — Assessment & Plan Note (Signed)
Immunizations UTD. Pap smear due, she declines today. She will schedule to return when ready.  Discussed the importance of a healthy diet and regular exercise in order for weight loss, and to reduce the risk of further co-morbidity.  Exam stable.   Follow up in 1 year for repeat physical.

## 2022-05-27 NOTE — Assessment & Plan Note (Signed)
Following with psychiatry. Controlled per patient.  Continue Seroquel 400 mg HS, Lexapro 10 mg daily. 

## 2022-05-27 NOTE — Progress Notes (Signed)
Subjective:    Patient ID: Jill Osborne, female    DOB: Nov 07, 2000, 21 y.o.   MRN: [id number]  HPI  Jill Osborne is a very pleasant 21 y.o. female who presents today for complete physical and follow up of chronic conditions.  Immunizations: -Tetanus: 2020 -Influenza: Did not complete last season -Covid-19: 2 vaccines  -HPV: Completed series  Diet: Fair diet.  Exercise: Regular exercise at the gym twice weekly.  Eye exam: Completes annually  Dental exam: Completes semi-annually   Pap Smear: Never completed, declines today.      Review of Systems  Constitutional:  Negative for unexpected weight change.  HENT:  Negative for rhinorrhea.   Respiratory:  Negative for cough and shortness of breath.   Cardiovascular:  Negative for chest pain.  Gastrointestinal:  Negative for constipation and diarrhea.  Genitourinary:  Negative for difficulty urinating and menstrual problem.  Musculoskeletal:  Negative for arthralgias and myalgias.  Skin:  Negative for rash.  Allergic/Immunologic: Negative for environmental allergies.  Neurological:  Negative for dizziness and headaches.  Psychiatric/Behavioral:  The patient is not nervous/anxious.          Past Medical History:  Diagnosis Date   Asthma    Depression    Lumbar herniated disc    MDD (major depressive disorder), recurrent severe, without psychosis (HCC) 07/23/2019   Schizophrenia (HCC)    Tetanus     Social History   Socioeconomic History   Marital status: Single    Spouse name: Not on file   Number of children: Not on file   Years of education: Not on file   Highest education level: Not on file  Occupational History   Not on file  Tobacco Use   Smoking status: Never   Smokeless tobacco: Never  Vaping Use   Vaping Use: Never used  Substance and Sexual Activity   Alcohol use: Yes   Drug use: Yes    Types: Marijuana   Sexual activity: Yes    Partners: Male    Birth control/protection: None, Condom   Other Topics Concern   Not on file  Social History Narrative   Single.   Student at [employer], [employer].   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   Enjoys reading, spending time with friends.   Social Determinants of Health   Financial Resource Strain: Not on file  Food Insecurity: Not on file  Transportation Needs: Not on file  Physical Activity: Not on file  Stress: Not on file  Social Connections: Not on file  Intimate Partner Violence: Not on file    Past Surgical History:  Procedure Laterality Date   LAPAROSCOPIC APPENDECTOMY N/A 07/15/2016   Procedure: APPENDECTOMY LAPAROSCOPIC;  Surgeon: Ricarda Frame, MD;  Location: [facility] ORS;  Service: General;  Laterality: N/A;    Family History  Problem Relation Age of Onset   Healthy Mother    Healthy Father     Allergies  Allergen Reactions   Other Hives    Stone fruit   Shellfish Allergy Hives   Eggs Or Egg-Derived Products Nausea And Vomiting    Current Outpatient Medications on File Prior to Visit  Medication Sig Dispense Refill   escitalopram (LEXAPRO) 10 MG tablet Take 1 tablet (10 mg total) by mouth daily. 90 tablet 0   QUEtiapine (SEROQUEL) 400 MG tablet TAKE 1 TABLET BY MOUTH EVERYDAY AT BEDTIME 90 tablet 0   fluticasone (FLONASE) 50 MCG/ACT nasal spray Place 1 spray into both nostrils 2 (two) times daily. (  Patient not taking: Reported on 05/27/2022)     No current facility-administered medications on file prior to visit.    BP 110/62   Pulse 87   Temp 98.6 F (37 C) (Oral)   Ht 5' (1.524 m)   Wt 163 lb (73.9 kg)   SpO2 98%   BMI 31.83 kg/m  Objective:   Physical Exam HENT:     Right Ear: Tympanic membrane and ear canal normal.     Left Ear: Tympanic membrane and ear canal normal.     Nose: Nose normal.  Eyes:     Conjunctiva/sclera: Conjunctivae normal.     Pupils: Pupils are equal, round, and reactive to light.  Neck:     Thyroid: No thyromegaly.  Cardiovascular:     Rate and Rhythm: Normal rate and  regular rhythm.     Heart sounds: No murmur heard. Pulmonary:     Effort: Pulmonary effort is normal.     Breath sounds: Normal breath sounds. No rales.  Abdominal:     General: Bowel sounds are normal.     Palpations: Abdomen is soft.     Tenderness: There is no abdominal tenderness.  Musculoskeletal:        General: Normal range of motion.     Cervical back: Neck supple.  Lymphadenopathy:     Cervical: No cervical adenopathy.  Skin:    General: Skin is warm and dry.     Findings: No rash.  Neurological:     Mental Status: She is alert and oriented to person, place, and time.     Cranial Nerves: No cranial nerve deficit.     Deep Tendon Reflexes: Reflexes are normal and symmetric.  Psychiatric:        Mood and Affect: Mood normal.           Assessment & Plan:   Problem List Items Addressed This Visit       Other   Preventative health care - Primary    Immunizations UTD. Pap smear due, she declines today. She will schedule to return when ready.  Discussed the importance of a healthy diet and regular exercise in order for weight loss, and to reduce the risk of further co-morbidity.  Exam stable.   Follow up in 1 year for repeat physical.       Major depressive disorder    Following with psychiatry. Controlled per patient.  Continue Seroquel 400 mg HS, Lexapro 10 mg daily.      Schizoaffective disorder, depressive type [facility])    Following with psychiatry. Controlled per patient.  Continue Seroquel 400 mg HS, Lexapro 10 mg daily.      Auditory hallucinations    Continued, controlled per patient. Following with psychiatry.  Continue Seroquel 400 mg HS, Lexapro 10 mg daily.          Doreene Nest, NP

## 2022-05-27 NOTE — Assessment & Plan Note (Signed)
Following with psychiatry. Controlled per patient.  Continue Seroquel 400 mg HS, Lexapro 10 mg daily.

## 2022-05-27 NOTE — Assessment & Plan Note (Signed)
Continued, controlled per patient. Following with psychiatry.  Continue Seroquel 400 mg HS, Lexapro 10 mg daily.

## 2022-07-13 ENCOUNTER — Other Ambulatory Visit: Payer: Self-pay | Admitting: Adult Health

## 2022-07-13 DIAGNOSIS — F411 Generalized anxiety disorder: Secondary | ICD-10-CM

## 2022-08-07 ENCOUNTER — Other Ambulatory Visit: Payer: Self-pay | Admitting: Adult Health

## 2022-08-07 DIAGNOSIS — F411 Generalized anxiety disorder: Secondary | ICD-10-CM

## 2022-08-08 ENCOUNTER — Telehealth: Payer: Self-pay | Admitting: Adult Health

## 2022-08-08 ENCOUNTER — Other Ambulatory Visit: Payer: Self-pay

## 2022-08-08 DIAGNOSIS — F251 Schizoaffective disorder, depressive type: Secondary | ICD-10-CM

## 2022-08-08 MED ORDER — QUETIAPINE FUMARATE 400 MG PO TABS: ORAL_TABLET | ORAL | 0 refills | Status: DC

## 2022-08-08 NOTE — Telephone Encounter (Signed)
Rx sent 

## 2022-08-08 NOTE — Telephone Encounter (Signed)
Jill Osborne called today at 3:50 to request refill of her Seroquel.  Appt 11/17/22. [facility] [place]

## 2022-08-20 ENCOUNTER — Emergency Department: Payer: 59

## 2022-08-20 ENCOUNTER — Emergency Department
Admission: EM | Admit: 2022-08-20 | Discharge: 2022-08-20 | Disposition: A | Discharge status: Home / Self Care | Payer: 59 | Attending: Emergency Medicine | Admitting: Emergency Medicine

## 2022-08-20 ENCOUNTER — Other Ambulatory Visit: Payer: Self-pay

## 2022-08-20 DIAGNOSIS — R102 Pelvic and perineal pain: Secondary | ICD-10-CM | POA: Insufficient documentation

## 2022-08-20 DIAGNOSIS — R1030 Lower abdominal pain, unspecified: Secondary | ICD-10-CM | POA: Diagnosis not present

## 2022-08-20 LAB — URINALYSIS, ROUTINE W REFLEX MICROSCOPIC
Bilirubin Urine: NEGATIVE
Glucose, UA: NEGATIVE mg/dL
Ketones, ur: NEGATIVE mg/dL
Nitrite: NEGATIVE
Protein, ur: NEGATIVE mg/dL
Specific Gravity, Urine: 1.005 (ref 1.005–1.030)
pH: 6 (ref 5.0–8.0)

## 2022-08-20 LAB — CBC WITH DIFFERENTIAL/PLATELET
Abs Immature Granulocytes: 0.03 10*3/uL (ref 0.00–0.07)
Basophils Absolute: 0 10*3/uL (ref 0.0–0.1)
Basophils Relative: 1 %
Eosinophils Absolute: 0.2 10*3/uL (ref 0.0–0.5)
Eosinophils Relative: 3 %
HCT: 36.5 % (ref 36.0–46.0)
Hemoglobin: 12.1 g/dL (ref 12.0–15.0)
Immature Granulocytes: 1 %
Lymphocytes Relative: 19 %
Lymphs Abs: 1.1 10*3/uL (ref 0.7–4.0)
MCH: 29.7 pg (ref 26.0–34.0)
MCHC: 33.2 g/dL (ref 30.0–36.0)
MCV: 89.5 fL (ref 80.0–100.0)
Monocytes Absolute: 0.3 10*3/uL (ref 0.1–1.0)
Monocytes Relative: 5 %
Neutro Abs: 4.2 10*3/uL (ref 1.7–7.7)
Neutrophils Relative %: 71 %
Platelets: 279 10*3/uL (ref 150–400)
RBC: 4.08 MIL/uL (ref 3.87–5.11)
RDW: 13.1 % (ref 11.5–15.5)
WBC: 5.8 10*3/uL (ref 4.0–10.5)
nRBC: 0 % (ref 0.0–0.2)

## 2022-08-20 LAB — BASIC METABOLIC PANEL
Anion gap: 8 (ref 5–15)
BUN: 13 mg/dL (ref 6–20)
CO2: 24 mmol/L (ref 22–32)
Calcium: 9.5 mg/dL (ref 8.9–10.3)
Chloride: 105 mmol/L (ref 98–111)
Creatinine, Ser: 0.79 mg/dL (ref 0.44–1.00)
GFR, Estimated: >60 mL/min (ref >60)
Glucose, Bld: 101 mg/dL — ABNORMAL HIGH (ref 70–99)
Potassium: 3.6 mmol/L (ref 3.5–5.1)
Sodium: 137 mmol/L (ref 135–145)

## 2022-08-20 LAB — LIPASE, BLOOD: Lipase: 46 U/L (ref 11–51)

## 2022-08-20 LAB — POC URINE PREG, ED: Preg Test, Ur: NEGATIVE

## 2022-08-20 NOTE — Discharge Instructions (Addendum)
Your exam, labs, and ultrasound are all normal and reassuring at this time.  No signs of an ovarian cyst or ovarian torsion.  You should follow-up with your primary provider or GYN provider for ongoing symptoms.  Return to the ED if needed.

## 2022-08-20 NOTE — ED Triage Notes (Addendum)
Pt comes from home. Pt caox 4, NAD. Pt c/o lower abd pain. Pt stated she was having intercourse last night, and began having pain in her lower abd during. Pt stated it felt "cervical". Some blood noted after. Pt c/o cramping today. Pt ambulatory.

## 2022-08-20 NOTE — ED Provider Notes (Signed)
[facility] Emergency Department Provider Note     Event Date/Time   First MD Initiated Contact with Patient 08/20/22 1513     (approximate)   History   Abdominal Pain   HPI  Jill Osborne is a 21 y.o. female presents to the ED for evaluation of lower abdominal pain and postcoital bleeding last night. She describes "cervical" pain during intercourse. Today she presents with pelvic cramping today. She denies fevers, chills, nausea or vomiting.     Physical Exam   Triage Vital Signs: ED Triage Vitals  Enc Vitals Group     BP 08/20/22 1459 122/80     Pulse Rate 08/20/22 1459 90     Resp 08/20/22 1459 20     Temp 08/20/22 1459 98.2 F (36.8 C)     Temp Source 08/20/22 1459 Oral     SpO2 08/20/22 1459 99 %     Weight 08/20/22 1501 162 lb (73.5 kg)     Height 08/20/22 1501 5' (1.524 m)     Head Circumference --      Peak Flow --      Pain Score 08/20/22 1500 4     Pain Loc --      Pain Edu? --      Excl. in [place]? --     Most recent vital signs: Vitals:   08/20/22 1459  BP: 122/80  Pulse: 90  Resp: 20  Temp: 98.2 F (36.8 C)  SpO2: 99%    General Awake, no distress. NAD CV:  Good peripheral perfusion.  RESP:  Normal effort.  ABD:  No distention.  No rebound, guarding, or rigidity.  Normoactive bowel sounds appreciated.  No CVA tenderness elicited. GU:  deferred   ED Results / Procedures / Treatments   Labs (all labs ordered are listed, but only abnormal results are displayed) Labs Reviewed  BASIC METABOLIC PANEL - Abnormal; Notable for the following components:      Result Value   Glucose, Bld 101 (*)    All other components within normal limits  URINALYSIS, ROUTINE W REFLEX MICROSCOPIC - Abnormal; Notable for the following components:   Color, Urine STRAW (*)    APPearance CLEAR (*)    Hgb urine dipstick SMALL (*)    Leukocytes,Ua TRACE (*)    Bacteria, UA RARE (*)    All other components within normal limits  CBC WITH  DIFFERENTIAL/PLATELET  LIPASE, BLOOD  POC URINE PREG, ED     EKG    RADIOLOGY  I personally viewed and evaluated these images as part of my medical decision making, as well as reviewing the written report by the radiologist.  ED Provider Interpretation: No acute findings including hemorrhagic ovarian cyst or evidence of ovarian torsion.  US PELVIC COMPLETE W TRANSVAGINAL AND TORSION R/O  Result Date: 08/20/2022 CLINICAL DATA:  Bilateral pelvic pain for the past 2 days. Intrauterine device in place. EXAM: TRANSABDOMINAL AND TRANSVAGINAL ULTRASOUND OF PELVIS DOPPLER ULTRASOUND OF OVARIES TECHNIQUE: Both transabdominal and transvaginal ultrasound examinations of the pelvis were performed. Transabdominal technique was performed for global imaging of the pelvis including uterus, ovaries, adnexal regions, and pelvic cul-de-sac. It was necessary to proceed with endovaginal exam following the transabdominal exam to visualize the uterus and ovaries in better detail. Color and duplex Doppler ultrasound was utilized to evaluate blood flow to the ovaries. COMPARISON:  None Available. FINDINGS: Uterus Measurements: 7.1 x 4.0 x 3.2 cm = volume: 46.2 mL. Endometrial device in expected  position. No fibroids or other mass visualized. Endometrium Thickness: 3.1 mm.  No focal abnormality visualized. Right ovary Measurements: 4.3 x 2.8 x 2.5 cm = volume: 15.6 mL. Partially collapsed corpus luteum. Left ovary Measurements: 3.4 x 2.8 x 1.4 cm = volume: 7.2 mL. Normal appearance/no adnexal mass. Pulsed Doppler evaluation of both ovaries demonstrates normal low-resistance arterial and venous waveforms. Other findings No abnormal free fluid. IMPRESSION: Normal examination. Endometrial device in expected position. Electronically Signed   By: Beckie Salts M.D.   On: 08/20/2022 18:03     PROCEDURES:  Critical Care performed: No  Procedures   MEDICATIONS ORDERED IN ED: Medications - No data to  display   IMPRESSION / MDM / ASSESSMENT AND PLAN / ED COURSE  I reviewed the triage vital signs and the nursing notes.                              Differential diagnosis includes, but is not limited to, ovarian cyst, ovarian torsion, acute appendicitis, diverticulitis, urinary tract infection/pyelonephritis, endometriosis, bowel obstruction, colitis, renal colic, gastroenteritis, hernia, fibroids, endometriosis, pregnancy related pain including ectopic pregnancy, etc.   Patient's presentation is most consistent with acute complicated illness / injury requiring diagnostic workup.  Patient's diagnosis is consistent with pelvic pain with unclear etiology. Patient will be discharged home with directions to take OTC Tylenol or Motrin as needed. Patient is to follow up with PCP or GYN as needed or otherwise directed. Patient is given ED precautions to return to the ED for any worsening or new symptoms.  Clinical Course as of 08/20/22 2113  Sat Aug 20, 2022  1838 US PELVIC COMPLETE W TRANSVAGINAL AND TORSION R/O [JM]    Clinical Course User Index [JM] Bannon Giammarco, Charlesetta Ivory, PA-C    FINAL CLINICAL IMPRESSION(S) / ED DIAGNOSES   Final diagnoses:  Pelvic pain in female     Rx / DC Orders   ED Discharge Orders     None        Note:  This document was prepared using Dragon voice recognition software and may include unintentional dictation errors.    Lissa Hoard, PA-C 08/20/22 2113    Jene Every, MD 08/22/22 [phone]

## 2022-08-29 ENCOUNTER — Telehealth: Payer: 59

## 2022-08-29 ENCOUNTER — Ambulatory Visit
Admission: EM | Admit: 2022-08-29 | Discharge: 2022-08-29 | Disposition: A | Discharge status: Home / Self Care | Payer: 59 | Attending: Emergency Medicine | Admitting: Emergency Medicine

## 2022-08-29 ENCOUNTER — Telehealth: Payer: Self-pay | Admitting: Primary Care

## 2022-08-29 DIAGNOSIS — J069 Acute upper respiratory infection, unspecified: Secondary | ICD-10-CM | POA: Diagnosis not present

## 2022-08-29 DIAGNOSIS — R059 Cough, unspecified: Secondary | ICD-10-CM

## 2022-08-29 MED ORDER — BENZONATATE 100 MG PO CAPS: 100.0000 mg | ORAL_CAPSULE | Freq: Three times a day (TID) | ORAL | 0 refills | Status: DC | PRN

## 2022-08-29 NOTE — Telephone Encounter (Signed)
[facility] Day - Client TELEPHONE ADVICE RECORD AccessNurse Patient Name: Jill Osborne [facility] Gender: Female DOB: May 21, 2001 Age: 21 Y 3 M 1 D Return Phone Number: [phone] (Primary) Address: [place]/ [place] [address] Client [facility] Day - Client Client Site [facility] [place] - Day Provider Vernona Rieger - NP Contact Type Call Who Is Calling Patient / Member / Family / Caregiver Call Type Triage / Clinical Relationship To Patient Self Return Phone Number [phone] (Primary) Chief Complaint Coughing Up Blood Reason for Call Symptomatic / Request for Health Information Initial Comment Caller states she is experiencing a sore throat and is coughing up blood. Translation No Nurse Assessment Nurse: Hilbert Odor, RN, Esaw Grandchild Date/Time (Eastern Time): 08/29/2022 10:38:24 AM Confirm and document reason for call. If symptomatic, describe symptoms. ---Caller states each time she coughs she can taste blood and sputum orange tinged. coughing fits, feels she needs to catch breath. Coughing for a week, did have a sore throat but not anymore. Does the patient have any new or worsening symptoms? ---Yes Will a triage be completed? ---Yes Related visit to physician within the last 2 weeks? ---No Does the PT have any chronic conditions? (i.e. diabetes, asthma, this includes High risk factors for pregnancy, etc.) ---Yes List chronic conditions. ---Asthma Is the patient pregnant or possibly pregnant? (Ask all females between the ages of 4-55) ---No Is this a behavioral health or substance abuse call? ---No Guidelines Guideline Title Affirmed Question Affirmed Notes Nurse Date/Time (Eastern Time) Coughing Up Blood Coughing up rustycolored sputum Youman, RN, Esaw Grandchild 08/29/2022 10:41:37 AM Disp. Time Lamount Cohen Time) Disposition Final User 08/29/2022 10:51:50 AM See PCP within 24 Hours Yes Youman, RN,  Esaw Grandchild PLEASE NOTE: All timestamps contained within this report are represented as [place] Standard Time. CONFIDENTIALTY NOTICE: This fax transmission is intended only for the addressee. It contains information that is legally privileged, confidential or otherwise protected from use or disclosure. If you are not the intended recipient, you are strictly prohibited from reviewing, disclosing, copying using or disseminating any of this information or taking any action in reliance on or regarding this information. If you have received this fax in error, please notify us immediately by telephone so that we can arrange for its return to [place]. Phone: [phone], Toll-Free: [phone], Fax: [phone] Page: 2 of 2 Call Id: [id number] Final Disposition 08/29/2022 10:51:50 AM See PCP within 24 Hours Yes Hilbert Odor, RN, Deatra Ina Disagree/Comply Comply Caller Understands Yes PreDisposition Call Doctor Care Advice Given Per Guideline * IF OFFICE WILL BE OPEN: You need to be examined within the next 24 hours. Call your doctor (or NP/PA) when the office opens and make an appointment. COUGHING SPASMS: * Drink warm fluids. Inhale warm mist. Both help relax the airway and loosen up the phlegm. * Suck on cough drops or hard candy to coat the irritated throat. HUMIDIFIER: * If the air is dry, use a humidifier in the bedroom. * Dry air makes coughs worse. CALL BACK IF: * You cough up more than a tablespoon of pure red blood * Difficulty breathing * You become worse CARE ADVICE given per Coughing Up Blood guideline. Comments User: [name], RN Date/Time Lamount Cohen Time): 08/29/2022 10:42:33 AM chest pain when coughing, dry cough, chest itches, Referrals REFERRED TO PCP OFFIC

## 2022-08-29 NOTE — Telephone Encounter (Signed)
Patient called and stated she has a sore throat and been coughing up blood since yesterday. Patient sent to access nurse

## 2022-08-29 NOTE — Telephone Encounter (Signed)
Noted. Looks like patient was seen and evaluated at [facility] already

## 2022-08-29 NOTE — ED Provider Notes (Signed)
Renaldo Fiddler    CSN: [id number] Arrival date & time: 08/29/22  1205      History   Chief Complaint Chief Complaint  Patient presents with   Cough    HPI Jill Osborne is a 21 y.o. female.  Patient presents with 1 week history of cough.  Her cough is primarily nonproductive but occasionally productive of yellow sputum.  No hemoptysis, fever, chills, chest pain, shortness of breath, or other symptoms.  At the onset of her symptoms, she had sore throat and congestion but these have resolved.  Treatment at home with DayQuil; last taken yesterday.  Her medical history includes asthma.  The history is provided by the patient and medical records.    Past Medical History:  Diagnosis Date   Asthma    Depression    Lumbar herniated disc    MDD (major depressive disorder), recurrent severe, without psychosis (HCC) 07/23/2019   Schizophrenia (HCC)    Tetanus     Patient Active Problem List   Diagnosis Date Noted   Schizoaffective disorder, depressive type (HCC) 05/20/2020   Auditory hallucinations 03/23/2020   Psychosis (HCC) 03/07/2020   PTSD (post-traumatic stress disorder) 03/07/2020   Seizure-like activity (HCC) 02/10/2020   History of depression 02/10/2020   Memory impairment 02/10/2020   Transient confusion 02/10/2020   Unresponsive episode 02/10/2020   Weight loss, abnormal 01/13/2020   Fatigue 01/13/2020   Major depressive disorder 07/23/2019   Preventative health care 03/07/2018    Past Surgical History:  Procedure Laterality Date   LAPAROSCOPIC APPENDECTOMY N/A 07/15/2016   Procedure: APPENDECTOMY LAPAROSCOPIC;  Surgeon: Ricarda Frame, MD;  Location: [facility] ORS;  Service: General;  Laterality: N/A;    OB History     Gravida  0   Para  0   Term  0   Preterm  0   AB  0   Living  0      SAB  0   IAB  0   Ectopic  0   Multiple  0   Live Births  0            Home Medications    Prior to Admission medications   Medication Sig  Start Date End Date Taking? Authorizing Provider  benzonatate (TESSALON) 100 MG capsule Take 1 capsule (100 mg total) by mouth 3 (three) times daily as needed for cough. 08/29/22  Yes Mickie Bail, NP  escitalopram (LEXAPRO) 10 MG tablet TAKE 1 TABLET BY MOUTH EVERY DAY 08/08/22  Yes Mozingo, Thereasa Solo, NP  QUEtiapine (SEROQUEL) 400 MG tablet TAKE 1 TABLET BY MOUTH EVERYDAY AT BEDTIME 08/08/22  Yes Mozingo, Thereasa Solo, NP  fluticasone (FLONASE) 50 MCG/ACT nasal spray Place 1 spray into both nostrils 2 (two) times daily. Patient not taking: Reported on 05/27/2022 08/07/20   [provider]    Family History Family History  Problem Relation Age of Onset   Healthy Mother    Healthy Father     Social History Social History   Tobacco Use   Smoking status: Never   Smokeless tobacco: Never  Vaping Use   Vaping Use: Never used  Substance Use Topics   Alcohol use: Yes   Drug use: Yes    Types: Marijuana     Allergies   Other, Shellfish allergy, and Eggs or egg-derived products   Review of Systems Review of Systems  Constitutional:  Negative for chills and fever.  HENT:  Negative for congestion, ear pain and sore throat.  Respiratory:  Positive for cough. Negative for shortness of breath and wheezing.   Cardiovascular:  Negative for chest pain and palpitations.  Gastrointestinal:  Negative for diarrhea and vomiting.  Skin:  Negative for color change and rash.  All other systems reviewed and are negative.    Physical Exam Triage Vital Signs ED Triage Vitals  Enc Vitals Group     BP      Pulse      Resp      Temp      Temp src      SpO2      Weight      Height      Head Circumference      Peak Flow      Pain Score      Pain Loc      Pain Edu?      Excl. in [place]?    No data found.  Updated Vital Signs BP 101/62 (BP Location: Right Arm)   Pulse 99   Temp 98.1 F (36.7 C)   Resp 18   LMP 08/26/2022 (Approximate)   SpO2 97%   Visual  Acuity Right Eye Distance:   Left Eye Distance:   Bilateral Distance:    Right Eye Near:   Left Eye Near:    Bilateral Near:     Physical Exam Vitals and nursing note reviewed.  Constitutional:      General: She is not in acute distress.    Appearance: Normal appearance. She is well-developed. She is not ill-appearing.  HENT:     Right Ear: Tympanic membrane normal.     Left Ear: Tympanic membrane normal.     Nose: Nose normal.     Mouth/Throat:     Mouth: Mucous membranes are moist.     Pharynx: Oropharynx is clear.  Cardiovascular:     Rate and Rhythm: Normal rate and regular rhythm.     Heart sounds: Normal heart sounds.  Pulmonary:     Effort: Pulmonary effort is normal. No respiratory distress.     Breath sounds: Normal breath sounds.  Musculoskeletal:     Cervical back: Neck supple.  Skin:    General: Skin is warm and dry.  Neurological:     Mental Status: She is alert.  Psychiatric:        Mood and Affect: Mood normal.        Behavior: Behavior normal.      UC Treatments / Results  Labs (all labs ordered are listed, but only abnormal results are displayed) Labs Reviewed - No data to display  EKG   Radiology No results found.  Procedures Procedures (including critical care time)  Medications Ordered in UC Medications - No data to display  Initial Impression / Assessment and Plan / UC Course  I have reviewed the triage vital signs and the nursing notes.  Pertinent labs & imaging results that were available during my care of the patient were reviewed by me and considered in my medical decision making (see chart for details).   Cough, Viral URI.  Child is well-appearing and her exam is reassuring.  Afebrile and vital signs are stable.  COVID pending per patient request.  Discussed symptomatic treatment including Tessalon Perles, Tylenol or ibuprofen, rest, hydration.  Instructed patient to follow up with PCP if symptoms are not improving.  He agrees to  plan of care.    Final Clinical Impressions(s) / UC Diagnoses   Final diagnoses:  Cough, unspecified type  Viral URI     Discharge Instructions      Take the Tessalon Perles as directed for cough.    Your COVID test is pending.    Take Tylenol or ibuprofen as needed for fever or discomfort.  Rest and keep yourself hydrated.    Follow-up with your primary care provider if your symptoms are not improving.         ED Prescriptions     Medication Sig Dispense Auth. Provider   benzonatate (TESSALON) 100 MG capsule Take 1 capsule (100 mg total) by mouth 3 (three) times daily as needed for cough. 21 capsule Mickie Bail, NP      PDMP not reviewed this encounter.   Mickie Bail, NP 08/29/22 1235

## 2022-08-29 NOTE — ED Triage Notes (Signed)
Cough that started a week ago, started last night started tasting blood when she coughs and coughed up once a darker yellow color mucus.

## 2022-08-29 NOTE — Discharge Instructions (Addendum)
Take the Tessalon Perles as directed for cough.  Your COVID test is pending.  Take Tylenol or ibuprofen as needed for fever or discomfort.  Rest and keep yourself hydrated.    Follow-up with your primary care provider if your symptoms are not improving.     

## 2022-08-29 NOTE — Telephone Encounter (Signed)
I spoke with pt and she said she prefers to be seen in person due to coughing episodes and feeling like needs to catch her breath. Pt is going to [facility] in [place] to be seen and pt asked that I cancel telehealth visit. Sending note to Audria Nine NP and [facility].

## 2022-09-15 ENCOUNTER — Other Ambulatory Visit (HOSPITAL_COMMUNITY)
Admission: RE | Admit: 2022-09-15 | Discharge: 2022-09-15 | Disposition: A | Discharge status: Home / Self Care | Payer: 59 | Source: Ambulatory Visit | Attending: Advanced Practice Midwife | Admitting: Advanced Practice Midwife

## 2022-09-15 ENCOUNTER — Encounter: Payer: Self-pay | Admitting: Advanced Practice Midwife

## 2022-09-15 ENCOUNTER — Ambulatory Visit: Payer: 59 | Admitting: Advanced Practice Midwife

## 2022-09-15 VITALS — BP 108/71 | HR 94 | Wt 162.0 lb

## 2022-09-15 DIAGNOSIS — Z124 Encounter for screening for malignant neoplasm of cervix: Secondary | ICD-10-CM | POA: Diagnosis not present

## 2022-09-15 DIAGNOSIS — N941 Unspecified dyspareunia: Secondary | ICD-10-CM | POA: Diagnosis not present

## 2022-09-15 DIAGNOSIS — Z975 Presence of (intrauterine) contraceptive device: Secondary | ICD-10-CM | POA: Diagnosis not present

## 2022-09-15 NOTE — Progress Notes (Signed)
GYNECOLOGY ANNUAL PREVENTATIVE CARE ENCOUNTER NOTE  History:     Jill Osborne is a 21 y.o. G0P0000 female here for a routine annual gynecologic exam.  Current complaints: new onset pain during intercourse. Patient has been with her current partner for about one year. In early November she experienced new onset pain with intercourse. She believes her cervix is the locus of her pain. Pain radiates to her low abdomen and is sharp. Pain resolves completely as soon as penetration is discontinued.   Denies abnormal vaginal bleeding, discharge, pelvic pain, problems with intercourse or other gynecologic concerns.    Patient is s/p ED visit for this complaint on 08/29/2022.   Gynecologic History Patient's last menstrual period was 08/28/2022 (approximate). Contraception: IUD Last Pap: No pap history.   Obstetric History OB History  Gravida Para Term Preterm AB Living  0 0 0 0 0 0  SAB IAB Ectopic Multiple Live Births  0 0 0 0 0    Past Medical History:  Diagnosis Date   Asthma    Depression    Lumbar herniated disc    MDD (major depressive disorder), recurrent severe, without psychosis (HCC) 07/23/2019   Schizophrenia (HCC)    Tetanus     Past Surgical History:  Procedure Laterality Date   LAPAROSCOPIC APPENDECTOMY N/A 07/15/2016   Procedure: APPENDECTOMY LAPAROSCOPIC;  Surgeon: Ricarda Frame, MD;  Location: [facility] ORS;  Service: General;  Laterality: N/A;    Current Outpatient Medications on File Prior to Visit  Medication Sig Dispense Refill   benzonatate (TESSALON) 100 MG capsule Take 1 capsule (100 mg total) by mouth 3 (three) times daily as needed for cough. 21 capsule 0   escitalopram (LEXAPRO) 10 MG tablet TAKE 1 TABLET BY MOUTH EVERY DAY 90 tablet 1   fluticasone (FLONASE) 50 MCG/ACT nasal spray Place 1 spray into both nostrils 2 (two) times daily. (Patient not taking: Reported on 05/27/2022)     QUEtiapine (SEROQUEL) 400 MG tablet TAKE 1 TABLET BY MOUTH EVERYDAY AT  BEDTIME 90 tablet 0   No current facility-administered medications on file prior to visit.    Allergies  Allergen Reactions   Other Hives    Stone fruit   Shellfish Allergy Hives   Eggs Or Egg-Derived Products Nausea And Vomiting    Social History:  reports that she has never smoked. She has never used smokeless tobacco. She reports current alcohol use. She reports current drug use. Drug: Marijuana.  Family History  Problem Relation Age of Onset   Healthy Mother    Healthy Father     The following portions of the patient's history were reviewed and updated as appropriate: allergies, current medications, past family history, past medical history, past social history, past surgical history and problem list.  Review of Systems Pertinent items noted in HPI and remainder of comprehensive ROS otherwise negative.  Physical Exam:  BP 108/71   Pulse 94   Wt 162 lb (73.5 kg)   LMP 08/28/2022 (Approximate)   BMI 31.64 kg/m  CONSTITUTIONAL: Well-developed, well-nourished female in no acute distress.  HENT:  Normocephalic, atraumatic, External right and left ear normal.  EYES: Conjunctivae and EOM are normal. Pupils are equal, round, and reactive to light. No scleral icterus.  NECK: Normal range of motion, supple, no masses.  Normal thyroid.  SKIN: Skin is warm and dry. No rash noted. Not diaphoretic. No erythema. No pallor. MUSCULOSKELETAL: Normal range of motion. No tenderness.  No cyanosis, clubbing, or edema. NEUROLOGIC: Alert and  oriented to person, place, and time. Normal reflexes, muscle tone coordination.  PSYCHIATRIC: Normal mood and affect. Normal behavior. Normal judgment and thought content. CARDIOVASCULAR: Normal heart rate noted, regular rhythm RESPIRATORY: Clear to auscultation bilaterally. Effort and breath sounds normal, no problems with respiration noted. BREASTS: Symmetric in size. No masses, tenderness, skin changes, nipple drainage, or lymphadenopathy bilaterally.  Performed in the presence of a chaperone. ABDOMEN: Soft, no distention noted.  No tenderness, rebound or guarding.  PELVIC: Normal appearing external genitalia and urethral meatus; normal appearing vaginal mucosa and cervix.  No abnormal vaginal discharge noted.  Pap smear obtained.  Patient's previously reported cervical pain occurred when brush made contact with surface of cervix. Performed in the presence of a chaperone.   Assessment and Plan:    1. Dyspareunia in female - [place] results from 08/29/2022 ED visit reviewed, no abnormal findings - Pain reproduced with gentle contact with cervix during pap smear collection - Cervicovaginal ancillary only( [place]) - Urine Culture  2. Screening for cervical cancer  - Cytology - PAP  3. IUD (intrauterine device) in place - Strings tucked behind protrusion of cervix - No evidence of malposition at this time  Will follow up results of pap smear and CV swab and manage accordingly. Routine preventative health maintenance measures emphasized. Please refer to After Visit Summary for other counseling recommendations.      Clayton Bibles, MSA, MSN, CNM Certified Nurse Midwife, Biochemist, clinical for [employer], [facility]

## 2022-09-15 NOTE — Progress Notes (Signed)
RGYN patient here c/o pain with intercourse x 1 month now with penetration.   Pt denies any irregular bleeding, denies any other pain.

## 2022-09-17 LAB — URINE CULTURE

## 2022-09-19 LAB — CERVICOVAGINAL ANCILLARY ONLY
Bacterial Vaginitis (gardnerella): NEGATIVE
Candida Glabrata: NEGATIVE
Candida Vaginitis: NEGATIVE
Chlamydia: NEGATIVE
Comment: NEGATIVE
Comment: NEGATIVE
Comment: NEGATIVE
Comment: NEGATIVE
Comment: NEGATIVE
Comment: NORMAL
Neisseria Gonorrhea: NEGATIVE
Trichomonas: NEGATIVE

## 2022-09-20 LAB — CYTOLOGY - PAP

## 2022-09-23 ENCOUNTER — Encounter: Payer: Self-pay | Admitting: Advanced Practice Midwife

## 2022-09-23 DIAGNOSIS — R87612 Low grade squamous intraepithelial lesion on cytologic smear of cervix (LGSIL): Secondary | ICD-10-CM | POA: Insufficient documentation

## 2022-11-09 ENCOUNTER — Other Ambulatory Visit: Payer: Self-pay | Admitting: Adult Health

## 2022-11-09 DIAGNOSIS — F251 Schizoaffective disorder, depressive type: Secondary | ICD-10-CM

## 2022-11-17 ENCOUNTER — Ambulatory Visit: Payer: 59 | Admitting: Adult Health

## 2022-11-28 ENCOUNTER — Ambulatory Visit (INDEPENDENT_AMBULATORY_CARE_PROVIDER_SITE_OTHER): Payer: Self-pay | Admitting: Adult Health

## 2022-11-28 DIAGNOSIS — F489 Nonpsychotic mental disorder, unspecified: Secondary | ICD-10-CM

## 2022-11-28 NOTE — Progress Notes (Deleted)
CEDAR GIGLIO [id number] 02/05/2001 22 y.o.  Subjective:   Patient ID:  Jill Osborne is a 22 y.o. (DOB 2000/12/30) female.  Chief Complaint: No chief complaint on file.   HPI LESHELL GENGO presents to the office today for follow-up of Schizophrenia, MDD, GAD, PTSD, and insomnia.  Describes mood today as "ok". Pleasant. Denies tearfulness. Mood symptoms - denies depression, anxiety and irritability. Mood is mostly consistent. Stating "I feel annoyed at times - more so in work setting". Feels like medications - Seroquel and Lexapro continue to work well. Improved interest and motivation. Taking medications as prescribed.  Energy levels stable. Active, has a regular exercise routine. Going to the gym. Enjoys some usual interests and activities. Single. Has a boyfriend x 1 year. Lives with parents and 2 siblings. Spending time with family. Appetite stable. Weight gain - 155 pounds.  Sleeping well most nights. Averages 9 hours. Focus and concentration stable. Completing tasks. Managing aspects of household. Working full time at [employer].  Denies SI or HI.  Positive for AH - "every day, but not a lot". Denies VH.       [place] Office Visit from 05/27/2022 in [place] at [place]  PHQ-2 Total Score 0  PHQ-9 Total Score [place] ED from 08/29/2022 in [facility] at [facility]  ED from 08/20/2022 in [facility] Emergency Department at [facility] ED from 05/19/2020 in [facility] Emergency Department at [place] No Risk No Risk No Risk        Review of Systems:  Review of Systems  Musculoskeletal:  Negative for gait problem.  Neurological:  Negative for tremors.  Psychiatric/Behavioral:         Please refer to HPI    Medications: {medication reviewed/display:3041432}  Current Outpatient Medications  Medication Sig Dispense Refill   benzonatate (TESSALON) 100 MG capsule Take 1  capsule (100 mg total) by mouth 3 (three) times daily as needed for cough. 21 capsule 0   escitalopram (LEXAPRO) 10 MG tablet TAKE 1 TABLET BY MOUTH EVERY DAY 90 tablet 1   fluticasone (FLONASE) 50 MCG/ACT nasal spray Place 1 spray into both nostrils 2 (two) times daily. (Patient not taking: Reported on 05/27/2022)     QUEtiapine (SEROQUEL) 400 MG tablet TAKE 1 TABLET BY MOUTH EVERYDAY AT BEDTIME 30 tablet 0   No current facility-administered medications for this visit.    Medication Side Effects: {Medication Side Effects (Optional):21014029}  Allergies:  Allergies  Allergen Reactions   Other Hives    Stone fruit   Shellfish Allergy Hives   Eggs Or Egg-Derived Products Nausea And Vomiting    Past Medical History:  Diagnosis Date   Asthma    Depression    Lumbar herniated disc    MDD (major depressive disorder), recurrent severe, without psychosis ([place]) 07/23/2019   Schizophrenia ([place])    Tetanus     Past Medical History, Surgical history, Social history, and Family history were reviewed and updated as appropriate.   Please see review of systems for further details on the patient's review from today.   Objective:   Physical Exam:  There were no vitals taken for this visit.  Physical Exam Constitutional:      General: She is not in acute distress. Musculoskeletal:        General: No deformity.  Neurological:     Mental Status: She is alert and  oriented to person, place, and time.     Coordination: Coordination normal.  Psychiatric:        Attention and Perception: Attention and perception normal. She does not perceive auditory or visual hallucinations.        Mood and Affect: Mood normal. Mood is not anxious or depressed. Affect is not labile, blunt, angry or inappropriate.        Speech: Speech normal.        Behavior: Behavior normal.        Thought Content: Thought content normal. Thought content is not paranoid or delusional. Thought content does not include  homicidal or suicidal ideation. Thought content does not include homicidal or suicidal plan.        Cognition and Memory: Cognition and memory normal.        Judgment: Judgment normal.     Comments: Insight intact     Lab Review:     Component Value Date/Time   NA 137 08/20/2022 1506   K 3.6 08/20/2022 1506   CL 105 08/20/2022 1506   CO2 24 08/20/2022 1506   GLUCOSE 101 (H) 08/20/2022 1506   BUN 13 08/20/2022 1506   CREATININE 0.79 08/20/2022 1506   CALCIUM 9.5 08/20/2022 1506   PROT 8.3 (H) 05/19/2020 1750   ALBUMIN 5.0 05/19/2020 1750   AST 16 05/19/2020 1750   ALT 9 05/19/2020 1750   ALKPHOS 57 05/19/2020 1750   BILITOT 0.8 05/19/2020 1750   GFRNONAA >60 08/20/2022 1506   GFRAA >60 05/19/2020 1750       Component Value Date/Time   WBC 5.8 08/20/2022 1506   RBC 4.08 08/20/2022 1506   HGB 12.1 08/20/2022 1506   HCT 36.5 08/20/2022 1506   PLT 279 08/20/2022 1506   MCV 89.5 08/20/2022 1506   MCH 29.7 08/20/2022 1506   MCHC 33.2 08/20/2022 1506   RDW 13.1 08/20/2022 1506   LYMPHSABS 1.1 08/20/2022 1506   MONOABS 0.3 08/20/2022 1506   EOSABS 0.2 08/20/2022 1506   BASOSABS 0.0 08/20/2022 1506    No results found for: "POCLITH", "LITHIUM"   No results found for: "PHENYTOIN", "PHENOBARB", "VALPROATE", "CBMZ"   .res Assessment: Plan:    Plan:  1. Seroquel 443m at bedtime   2. Lexapro 153mdaly   Continue therapy  RTC 6 months  Patient advised to contact office with any questions, adverse effects, or acute worsening in signs and symptoms.  Discussed potential metabolic side effects associated with atypical antipsychotics, as well as potential risk for movement side effects. Advised pt to contact office if movement side effects occur.   There are no diagnoses linked to this encounter.   Please see After Visit Summary for patient specific instructions.  Future Appointments  Date Time Provider [place]/09/2023  9:40 AM Shanna Un, ReBerdie Ogren NP CP-CP None    No orders of the defined types were placed in this encounter.   -------------------------------

## 2022-11-28 NOTE — Progress Notes (Signed)
Patient no show appointment. ? ?

## 2022-12-06 ENCOUNTER — Other Ambulatory Visit: Payer: Self-pay | Admitting: Adult Health

## 2022-12-06 DIAGNOSIS — F411 Generalized anxiety disorder: Secondary | ICD-10-CM

## 2022-12-06 DIAGNOSIS — F251 Schizoaffective disorder, depressive type: Secondary | ICD-10-CM

## 2022-12-07 NOTE — Telephone Encounter (Signed)
Please call to schedule an appt, was a no show last appt.

## 2022-12-07 NOTE — Telephone Encounter (Signed)
Lvm for patient to call and schedule 

## 2022-12-16 ENCOUNTER — Other Ambulatory Visit: Payer: Self-pay | Admitting: Adult Health

## 2022-12-16 DIAGNOSIS — F251 Schizoaffective disorder, depressive type: Secondary | ICD-10-CM

## 2022-12-19 ENCOUNTER — Telehealth: Payer: Self-pay | Admitting: Adult Health

## 2022-12-19 ENCOUNTER — Other Ambulatory Visit: Payer: Self-pay

## 2022-12-19 DIAGNOSIS — F251 Schizoaffective disorder, depressive type: Secondary | ICD-10-CM

## 2022-12-19 MED ORDER — QUETIAPINE FUMARATE 400 MG PO TABS: ORAL_TABLET | ORAL | 0 refills | Status: DC

## 2022-12-19 NOTE — Telephone Encounter (Signed)
Pt has apt 3/5. Requesting RF Qu[facility]apine [facility] [place]

## 2022-12-19 NOTE — Telephone Encounter (Signed)
Sent!

## 2022-12-20 ENCOUNTER — Ambulatory Visit (INDEPENDENT_AMBULATORY_CARE_PROVIDER_SITE_OTHER): Payer: 59 | Admitting: Adult Health

## 2022-12-20 ENCOUNTER — Encounter: Payer: Self-pay | Admitting: Adult Health

## 2022-12-20 DIAGNOSIS — F251 Schizoaffective disorder, depressive type: Secondary | ICD-10-CM | POA: Diagnosis not present

## 2022-12-20 DIAGNOSIS — F4310 Post-traumatic stress disorder, unspecified: Secondary | ICD-10-CM | POA: Diagnosis not present

## 2022-12-20 DIAGNOSIS — G4700 Insomnia, unspecified: Secondary | ICD-10-CM

## 2022-12-20 DIAGNOSIS — F411 Generalized anxiety disorder: Secondary | ICD-10-CM | POA: Diagnosis not present

## 2022-12-20 MED ORDER — QUETIAPINE FUMARATE 400 MG PO TABS: ORAL_TABLET | ORAL | 1 refills | Status: DC

## 2022-12-20 MED ORDER — ESCITALOPRAM OXALATE 10 MG PO TABS: 10.0000 mg | ORAL_TABLET | Freq: Every day | ORAL | 1 refills | Status: DC

## 2022-12-20 NOTE — Progress Notes (Signed)
VEAR MCAULAY [id number] [birth year] 22 y.o.  Subjective:   Patient ID:  Jill Osborne is a 22 y.o. (DOB 2000/11/12) female.  Chief Complaint: No chief complaint on file.   HPI Jill Osborne presents to the office today for follow-up of Schizophrenia, GAD, PTSD, and insomnia.  Describes mood today as "ok". Pleasant. Denies tearfulness. Mood symptoms - denies depression, anxiety and irritability. Denies worry, rumination, and over thinking. Mood is consistent. Stating "I feel like I'm doing ok". Feels like medications - Seroquel and Lexapro continue to work well. Stable interest and motivation. Taking medications as prescribed.  Energy levels stable. Active, has a regular exercise routine. Going to the gym. Enjoys some usual interests and activities. Single. Has a boyfriend x 1 year. Lives with parents and 2 siblings. Spending time with family. Appetite stable. Weight gain - 155 pounds.  Sleeping well most nights. Averages 9 hours. Focus and concentration stable. Completing tasks. Managing aspects of household. Working full time at [employer].  Denies SI or HI.  Positive for AH - "way less and easy to ignore". Denies VH.   Denies self harm. Denies substance use.     [place] Office Visit from 05/27/2022 in [place] [facility] [place]  PHQ-2 Total Score 0  PHQ-9 Total Score [place] ED from 08/29/2022 in [facility] at [facility]  ED from 08/20/2022 in [facility] Emergency Department at [facility] ED from 05/19/2020 in [facility] Emergency Department at [place] No Risk No Risk No Risk        Review of Systems:  Review of Systems  Musculoskeletal:  Negative for gait problem.  Neurological:  Negative for tremors.  Psychiatric/Behavioral:         Please refer to HPI    Medications: I have reviewed the patient's current medications.  Current Outpatient Medications  Medication Sig  Dispense Refill   benzonatate (TESSALON) 100 MG capsule Take 1 capsule (100 mg total) by mouth 3 (three) times daily as needed for cough. 21 capsule 0   escitalopram (LEXAPRO) 10 MG tablet Take 1 tablet (10 mg total) by mouth daily. 90 tablet 1   fluticasone (FLONASE) 50 MCG/ACT nasal spray Place 1 spray into both nostrils 2 (two) times daily. (Patient not taking: Reported on 05/27/2022)     QUEtiapine (SEROQUEL) 400 MG tablet TAKE 1 TABLET BY MOUTH EVERYDAY AT BEDTIME 90 tablet 1   No current facility-administered medications for this visit.    Medication Side Effects: None  Allergies:  Allergies  Allergen Reactions   Other Hives    Stone fruit   Shellfish Allergy Hives   Eggs Or Egg-Derived Products Nausea And Vomiting    Past Medical History:  Diagnosis Date   Asthma    Depression    Lumbar herniated disc    MDD (major depressive disorder), recurrent severe, without psychosis ([place]) 07/23/2019   Schizophrenia ([place])    Tetanus     Past Medical History, Surgical history, Social history, and Family history were reviewed and updated as appropriate.   Please see review of systems for further details on the patient's review from today.   Objective:   Physical Exam:  There were no vitals taken for this visit.  Physical Exam Constitutional:      General: She is not in acute distress. Musculoskeletal:        General: No deformity.  Neurological:  Mental Status: She is alert and oriented to person, place, and time.     Coordination: Coordination normal.  Psychiatric:        Attention and Perception: Attention and perception normal. She does not perceive auditory or visual hallucinations.        Mood and Affect: Mood normal. Mood is not anxious or depressed. Affect is not labile, blunt, angry or inappropriate.        Speech: Speech normal.        Behavior: Behavior normal.        Thought Content: Thought content normal. Thought content is not paranoid or delusional.  Thought content does not include homicidal or suicidal ideation. Thought content does not include homicidal or suicidal plan.        Cognition and Memory: Cognition and memory normal.        Judgment: Judgment normal.     Comments: Insight intact     Lab Review:     Component Value Date/Time   NA 137 08/20/2022 1506   K 3.6 08/20/2022 1506   CL 105 08/20/2022 1506   CO2 24 08/20/2022 1506   GLUCOSE 101 (H) 08/20/2022 1506   BUN 13 08/20/2022 1506   CREATININE 0.79 08/20/2022 1506   CALCIUM 9.5 08/20/2022 1506   PROT 8.3 (H) 05/19/2020 1750   ALBUMIN 5.0 05/19/2020 1750   AST 16 05/19/2020 1750   ALT 9 05/19/2020 1750   ALKPHOS 57 05/19/2020 1750   BILITOT 0.8 05/19/2020 1750   GFRNONAA >60 08/20/2022 1506   GFRAA >60 05/19/2020 1750       Component Value Date/Time   WBC 5.8 08/20/2022 1506   RBC 4.08 08/20/2022 1506   HGB 12.1 08/20/2022 1506   HCT 36.5 08/20/2022 1506   PLT 279 08/20/2022 1506   MCV 89.5 08/20/2022 1506   MCH 29.7 08/20/2022 1506   MCHC 33.2 08/20/2022 1506   RDW 13.1 08/20/2022 1506   LYMPHSABS 1.1 08/20/2022 1506   MONOABS 0.3 08/20/2022 1506   EOSABS 0.2 08/20/2022 1506   BASOSABS 0.0 08/20/2022 1506    No results found for: "POCLITH", "LITHIUM"   No results found for: "PHENYTOIN", "PHENOBARB", "VALPROATE", "CBMZ"   .res Assessment: Plan:    Plan:  1. Seroquel '400mg'$  at bedtime   2. Lexapro '10mg'$  daly   Continue therapy  RTC 6 months  Patient advised to contact office with any questions, adverse effects, or acute worsening in signs and symptoms.  Discussed potential metabolic side effects associated with atypical antipsychotics, as well as potential risk for movement side effects. Advised pt to contact office if movement side effects occur.  Diagnoses and all orders for this visit:  PTSD (post-traumatic stress disorder)  Schizoaffective disorder, depressive type (HCC) -     QUEtiapine (SEROQUEL) 400 MG tablet; TAKE 1 TABLET BY  MOUTH EVERYDAY AT BEDTIME  Generalized anxiety disorder -     escitalopram (LEXAPRO) 10 MG tablet; Take 1 tablet (10 mg total) by mouth daily.  Insomnia, unspecified type     Please see After Visit Summary for patient specific instructions.  No future appointments.   No orders of the defined types were placed in this encounter.   -------------------------------

## 2023-06-08 ENCOUNTER — Encounter: Payer: Self-pay | Admitting: Emergency Medicine

## 2023-06-08 ENCOUNTER — Other Ambulatory Visit: Payer: Self-pay

## 2023-06-08 ENCOUNTER — Emergency Department: Payer: 59

## 2023-06-08 ENCOUNTER — Emergency Department
Admission: EM | Admit: 2023-06-08 | Discharge: 2023-06-08 | Discharge status: Against Medical Advice | Payer: 59 | Attending: Emergency Medicine | Admitting: Emergency Medicine

## 2023-06-08 DIAGNOSIS — Z1152 Encounter for screening for COVID-19: Secondary | ICD-10-CM | POA: Insufficient documentation

## 2023-06-08 DIAGNOSIS — J45909 Unspecified asthma, uncomplicated: Secondary | ICD-10-CM | POA: Diagnosis not present

## 2023-06-08 DIAGNOSIS — R0602 Shortness of breath: Secondary | ICD-10-CM | POA: Diagnosis not present

## 2023-06-08 DIAGNOSIS — Z5321 Procedure and treatment not carried out due to patient leaving prior to being seen by health care provider: Secondary | ICD-10-CM | POA: Diagnosis not present

## 2023-06-08 LAB — RESP PANEL BY RT-PCR (RSV, FLU A&B, COVID)  RVPGX2
Influenza A by PCR: NEGATIVE
Influenza B by PCR: NEGATIVE
Resp Syncytial Virus by PCR: NEGATIVE
SARS Coronavirus 2 by RT PCR: NEGATIVE

## 2023-06-08 MED ORDER — IPRATROPIUM-ALBUTEROL 0.5-2.5 (3) MG/3ML IN SOLN
3.0000 mL | Freq: Once | RESPIRATORY_TRACT | Status: AC
  Administered 2023-06-08: 3 mL via RESPIRATORY_TRACT

## 2023-06-09 ENCOUNTER — Encounter: Payer: Self-pay | Admitting: Family Medicine

## 2023-06-09 ENCOUNTER — Ambulatory Visit: Payer: 59 | Admitting: Family Medicine

## 2023-06-09 VITALS — BP 90/60 | HR 84 | Temp 98.4°F | Ht 61.0 in | Wt 156.5 lb

## 2023-06-09 DIAGNOSIS — J4520 Mild intermittent asthma, uncomplicated: Secondary | ICD-10-CM | POA: Insufficient documentation

## 2023-06-09 DIAGNOSIS — J4521 Mild intermittent asthma with (acute) exacerbation: Secondary | ICD-10-CM | POA: Diagnosis not present

## 2023-06-09 MED ORDER — ALBUTEROL SULFATE HFA 108 (90 BASE) MCG/ACT IN AERS: 2.0000 | INHALATION_SPRAY | Freq: Four times a day (QID) | RESPIRATORY_TRACT | 2 refills | Status: AC | PRN

## 2023-06-09 NOTE — Progress Notes (Signed)
Patient ID: Jill Osborne, female    DOB: 05/12/2001, 22 y.o.   MRN: [id number]  This visit was conducted in person.  BP 90/60 (BP Location: Left Arm, Patient Position: Sitting, Cuff Size: Normal)   Pulse 84   Temp 98.4 F (36.9 C) (Temporal)   Ht 5\' 1"  (1.549 m)   Wt 156 lb 8 oz (71 kg)   LMP 05/17/2023 (Approximate)   SpO2 99%   BMI 29.57 kg/m    CC:  Chief Complaint  Patient presents with   Shortness of Breath    Hurts when she takes deep breath-Seen in ER last night   Rapid Heartbeat    Subjective:   HPI: Jill Osborne is a 22 y.o. female  with schizoaffective disorder, PTSD, MDD presenting on 06/09/2023 for Shortness of Breath (Hurts when she takes deep breath-Seen in ER last night) and Rapid Heartbeat  Reviewed ED note from 06/08/23 for  pleuritic chest pain She reports 2 days ago.. new onset chest pain with deep breaths.  No congestion, no ear pain, no ST, no face pain.  No cough.  SOB at rest or walking. Initially heard wheze.  More tired.   No typical allergies this time of year.   No sick contacts but works at [employer].   Temp 99 at home.  Negative COVID, flu test. CXR: No acute cardiopulmonary disease  Given ipratropium albuterol neb... this improved the chest pain and SOB.   Has had intermittent asthma since a child.    Relevant past medical, surgical, family and social history reviewed and updated as indicated. Interim medical history since our last visit reviewed. Allergies and medications reviewed and updated. Outpatient Medications Prior to Visit  Medication Sig Dispense Refill   escitalopram (LEXAPRO) 10 MG tablet Take 1 tablet (10 mg total) by mouth daily. 90 tablet 1   QUEtiapine (SEROQUEL) 400 MG tablet TAKE 1 TABLET BY MOUTH EVERYDAY AT BEDTIME 90 tablet 1   benzonatate (TESSALON) 100 MG capsule Take 1 capsule (100 mg total) by mouth 3 (three) times daily as needed for cough. 21 capsule 0   fluticasone (FLONASE) 50 MCG/ACT nasal spray Place  1 spray into both nostrils 2 (two) times daily. (Patient not taking: Reported on 05/27/2022)     No facility-administered medications prior to visit.     Per HPI unless specifically indicated in ROS section below Review of Systems  Constitutional:  Negative for fatigue and fever.  HENT:  Negative for congestion.   Eyes:  Negative for pain.  Respiratory:  Negative for cough and shortness of breath.   Cardiovascular:  Negative for chest pain, palpitations and leg swelling.  Gastrointestinal:  Negative for abdominal pain.  Genitourinary:  Negative for dysuria and vaginal bleeding.  Musculoskeletal:  Negative for back pain.  Neurological:  Negative for syncope, light-headedness and headaches.  Psychiatric/Behavioral:  Negative for dysphoric mood.    Objective:  BP 90/60 (BP Location: Left Arm, Patient Position: Sitting, Cuff Size: Normal)   Pulse 84   Temp 98.4 F (36.9 C) (Temporal)   Ht 5\' 1"  (1.549 m)   Wt 156 lb 8 oz (71 kg)   LMP 05/17/2023 (Approximate)   SpO2 99%   BMI 29.57 kg/m   Wt Readings from Last 3 Encounters:  06/09/23 156 lb 8 oz (71 kg)  06/08/23 162 lb (73.5 kg)  09/15/22 162 lb (73.5 kg)      Physical Exam Constitutional:      General: She is not in  acute distress.    Appearance: Normal appearance. She is well-developed. She is not ill-appearing or toxic-appearing.  HENT:     Head: Normocephalic.     Right Ear: Hearing, tympanic membrane, ear canal and external ear normal. Tympanic membrane is not erythematous, retracted or bulging.     Left Ear: Hearing, tympanic membrane, ear canal and external ear normal. Tympanic membrane is not erythematous, retracted or bulging.     Nose: No mucosal edema or rhinorrhea.     Right Sinus: No maxillary sinus tenderness or frontal sinus tenderness.     Left Sinus: No maxillary sinus tenderness or frontal sinus tenderness.     Mouth/Throat:     Mouth: Oropharynx is clear and moist and mucous membranes are normal.      Pharynx: Uvula midline.  Eyes:     General: Lids are normal. Lids are everted, no foreign bodies appreciated.     Extraocular Movements: EOM normal.     Conjunctiva/sclera: Conjunctivae normal.     Pupils: Pupils are equal, round, and reactive to light.  Neck:     Thyroid: No thyroid mass or thyromegaly.     Vascular: No carotid bruit.     Trachea: Trachea normal.  Cardiovascular:     Rate and Rhythm: Normal rate and regular rhythm.     Pulses: Normal pulses.     Heart sounds: Normal heart sounds, S1 normal and S2 normal. No murmur heard.    No friction rub. No gallop.  Pulmonary:     Effort: Pulmonary effort is normal. No tachypnea or respiratory distress.     Breath sounds: Normal breath sounds. No decreased breath sounds, wheezing, rhonchi or rales.  Abdominal:     General: Bowel sounds are normal.     Palpations: Abdomen is soft.     Tenderness: There is no abdominal tenderness.  Musculoskeletal:     Cervical back: Normal range of motion and neck supple.  Skin:    General: Skin is warm, dry and intact.     Findings: No rash.  Neurological:     Mental Status: She is alert.  Psychiatric:        Mood and Affect: Mood is not anxious or depressed.        Speech: Speech normal.        Behavior: Behavior normal. Behavior is cooperative.        Thought Content: Thought content normal.        Cognition and Memory: Cognition and memory normal.        Judgment: Judgment normal.       Results for orders placed or performed during the hospital encounter of 06/08/23  Resp panel by RT-PCR (RSV, Flu A&B, Covid) Anterior Nasal Swab   Specimen: Anterior Nasal Swab  Result Value Ref Range   SARS Coronavirus 2 by RT PCR NEGATIVE NEGATIVE   Influenza A by PCR NEGATIVE NEGATIVE   Influenza B by PCR NEGATIVE NEGATIVE   Resp Syncytial Virus by PCR NEGATIVE NEGATIVE    Assessment and Plan  Mild intermittent asthma with acute exacerbation Assessment & Plan: Acute, pleuritic chest pain  and shortness of breath possibly due to mild asthma flare secondary to viral or allergies.  No sign of bacterial infection.  Chest x-ray negative in the emergency room. Can use albuterol inhaler 2 puffs every 4-6 hours as needed for shortness of breath wheeze or chest tightness.  Recommend rest fluids and time. Emergency room precautions and return precautions provided.   Other  orders -     Albuterol Sulfate HFA; Inhale 2 puffs into the lungs every 6 (six) hours as needed for wheezing or shortness of breath.  Dispense: 8 g; Refill: 2    No follow-ups on file.   Kerby Nora, MD

## 2023-06-09 NOTE — Assessment & Plan Note (Signed)
Acute, pleuritic chest pain and shortness of breath possibly due to mild asthma flare secondary to viral or allergies.  No sign of bacterial infection.  Chest x-ray negative in the emergency room. Can use albuterol inhaler 2 puffs every 4-6 hours as needed for shortness of breath wheeze or chest tightness.  Recommend rest fluids and time. Emergency room precautions and return precautions provided.

## 2023-06-20 ENCOUNTER — Ambulatory Visit (INDEPENDENT_AMBULATORY_CARE_PROVIDER_SITE_OTHER): Payer: Self-pay | Admitting: Adult Health

## 2023-06-20 DIAGNOSIS — Z0389 Encounter for observation for other suspected diseases and conditions ruled out: Secondary | ICD-10-CM

## 2023-06-20 NOTE — Progress Notes (Signed)
Patient no show appointment. ? ?

## 2023-07-04 ENCOUNTER — Other Ambulatory Visit: Payer: Self-pay | Admitting: Adult Health

## 2023-07-04 DIAGNOSIS — F251 Schizoaffective disorder, depressive type: Secondary | ICD-10-CM

## 2023-07-19 ENCOUNTER — Other Ambulatory Visit (HOSPITAL_COMMUNITY)
Admission: RE | Admit: 2023-07-19 | Discharge: 2023-07-19 | Disposition: A | Discharge status: Home / Self Care | Payer: 59 | Source: Ambulatory Visit | Attending: Family Medicine | Admitting: Family Medicine

## 2023-07-19 ENCOUNTER — Ambulatory Visit (INDEPENDENT_AMBULATORY_CARE_PROVIDER_SITE_OTHER): Payer: 59 | Admitting: Family Medicine

## 2023-07-19 ENCOUNTER — Encounter: Payer: Self-pay | Admitting: Family Medicine

## 2023-07-19 VITALS — BP 106/74 | HR 76 | Wt 157.0 lb

## 2023-07-19 DIAGNOSIS — Z30433 Encounter for removal and reinsertion of intrauterine contraceptive device: Secondary | ICD-10-CM | POA: Insufficient documentation

## 2023-07-19 MED ORDER — LEVONORGESTREL 19.5 MG IU IUD
INTRAUTERINE_SYSTEM | Freq: Once | INTRAUTERINE | Status: AC
  Administered 2023-07-19: 1 via INTRAUTERINE

## 2023-07-19 NOTE — Progress Notes (Signed)
   Subjective: Patient here for IUD change.  Has been in for 5 years and due to be changed. She has no cycles and desires to continue [place] IUD for contraception.  Objective: Vitals:   07/19/23 1426  BP: 106/74  Pulse: 76    Patient identified, informed consent performed, signed copy in chart, time out was performed Procedure: Speculum placed inside vagina.  Cervix visualized.  Strings grasped with ring forceps.  IUD removed intact.  Procedure: Cervix visualized.  Cleaned with Hibiclens x 2.  Paracervical block performed with 20 cc of 1% Lidocaine. Grasped anteriourly with a single tooth tenaculum.  Uterus sounded to 7 cm.  Mirena IUD placed per manufacturer's recommendations.  Strings trimmed to 3 cm.   Patient given post procedure instructions and Mirena care card with expiration date.  Patient is asked to check IUD strings periodically and follow up in 4-6 weeks for IUD check.   Post procedure, patient with some mild ear ringing and vagal symptoms. Given water, ibuprofen, and rested for several minutes.  Impression: Encounter for IUD removal and reinsertion - Plan: levonorgestrel (KYLEENA) 19.5 MG IUD   Plan: Continue IUD for 5 years.

## 2023-07-19 NOTE — Addendum Note (Signed)
Addended by: Jyl Heinz on: 07/19/2023 04:26 PM   Modules accepted: Orders

## 2023-07-19 NOTE — Progress Notes (Signed)
RGYN here to have IUD removed reinserted   Kyleena placed 04/26/18  LMP:07/15/23 Last pap:09/15/22 LSIL

## 2023-07-24 LAB — CERVICOVAGINAL ANCILLARY ONLY
Chlamydia: NEGATIVE
Comment: NEGATIVE
Comment: NORMAL
Neisseria Gonorrhea: NEGATIVE

## 2023-07-27 ENCOUNTER — Other Ambulatory Visit: Payer: Self-pay | Admitting: Adult Health

## 2023-07-27 DIAGNOSIS — F251 Schizoaffective disorder, depressive type: Secondary | ICD-10-CM

## 2023-07-29 ENCOUNTER — Other Ambulatory Visit: Payer: Self-pay | Admitting: Adult Health

## 2023-07-29 DIAGNOSIS — F411 Generalized anxiety disorder: Secondary | ICD-10-CM

## 2023-07-29 DIAGNOSIS — F251 Schizoaffective disorder, depressive type: Secondary | ICD-10-CM

## 2023-07-30 NOTE — Telephone Encounter (Signed)
Sent MyChart message to schedule appt, was a no show last month.

## 2023-08-01 NOTE — Telephone Encounter (Signed)
Please call to schedule FU, did not respond to MyChart message. She was a no show last one.

## 2023-08-04 ENCOUNTER — Telehealth: Payer: Self-pay | Admitting: Adult Health

## 2023-08-04 ENCOUNTER — Other Ambulatory Visit: Payer: Self-pay

## 2023-08-04 DIAGNOSIS — F411 Generalized anxiety disorder: Secondary | ICD-10-CM

## 2023-08-04 DIAGNOSIS — F251 Schizoaffective disorder, depressive type: Secondary | ICD-10-CM

## 2023-08-04 MED ORDER — ESCITALOPRAM OXALATE 10 MG PO TABS: 10.0000 mg | ORAL_TABLET | Freq: Every day | ORAL | 0 refills | Status: DC

## 2023-08-04 MED ORDER — QUETIAPINE FUMARATE 400 MG PO TABS: ORAL_TABLET | ORAL | 0 refills | Status: DC

## 2023-08-04 NOTE — Telephone Encounter (Signed)
Pt Lvm @ 1:57p to schedule appt and get refill of Seroquel and Lexapro.  [facility] to  [id number] [place] - [address], [place] [address] Phone: [phone]  Fax: [phone]    Next appt 10/22

## 2023-08-04 NOTE — Telephone Encounter (Signed)
SENT 

## 2023-08-08 ENCOUNTER — Encounter: Payer: Self-pay | Admitting: Adult Health

## 2023-08-08 ENCOUNTER — Ambulatory Visit (INDEPENDENT_AMBULATORY_CARE_PROVIDER_SITE_OTHER): Payer: 59 | Admitting: Adult Health

## 2023-08-08 DIAGNOSIS — G4700 Insomnia, unspecified: Secondary | ICD-10-CM | POA: Diagnosis not present

## 2023-08-08 DIAGNOSIS — F251 Schizoaffective disorder, depressive type: Secondary | ICD-10-CM | POA: Diagnosis not present

## 2023-08-08 DIAGNOSIS — F4310 Post-traumatic stress disorder, unspecified: Secondary | ICD-10-CM | POA: Diagnosis not present

## 2023-08-08 DIAGNOSIS — F411 Generalized anxiety disorder: Secondary | ICD-10-CM | POA: Diagnosis not present

## 2023-08-08 MED ORDER — QUETIAPINE FUMARATE 400 MG PO TABS: ORAL_TABLET | ORAL | 5 refills | Status: DC

## 2023-08-08 MED ORDER — ESCITALOPRAM OXALATE 10 MG PO TABS: 10.0000 mg | ORAL_TABLET | Freq: Every day | ORAL | 5 refills | Status: DC

## 2023-08-08 NOTE — Progress Notes (Signed)
ZEKIAH STEVE [id number] 12-29-2000 22 y.o.  Subjective:   Patient ID:  Jill Osborne is a 22 y.o. (DOB [birth year]) female.  Chief Complaint: No chief complaint on file.   HPI Jill Osborne presents to the office today for follow-up of Schizophrenia, GAD, PTSD, and insomnia.  Describes mood today as "ok". Pleasant. Denies tearfulness. Mood symptoms - denies depression, anxiety and irritability. Denies panic attacks. Reports worry, rumination, and over thinking. Feels over whelmed about going out and mostly stays at home outside of going to work. Mood is consistent. Stating "I feel like I'm doing fine". Feels like medications - Seroquel and Lexapro continue to work well. Stable interest and motivation. Taking medications as prescribed.  Energy levels stable. Active, has a regular exercise routine. Going to the gym. Enjoys some usual interests and activities. Single. Has a boyfriend. Lives with parents and 2 siblings. Spending time with family. Appetite stable. Weight gain - 155 pounds.  Sleeping well most nights. Averages 9 hours. Focus and concentration stable. Completing tasks. Managing aspects of household. Working full time at [employer].  Denies SI or HI. Denies AH.  Denies VH.   Denies self harm. Denies substance use. Denies paranoia.     PHQ2-9    Flowsheet Row Office Visit from 06/09/2023 in [facility] Visit from 05/27/2022 in [facility] at [place]  PHQ-2 Total Score 0 0  PHQ-9 Total Score 2 0      Flowsheet Row ED from 08/29/2022 in [facility] at [facility]  ED from 08/20/2022 in [facility] Emergency Department at [facility] ED from 05/19/2020 in [facility] Emergency Department at [facility]  C-SSRS RISK CATEGORY No Risk No Risk No Risk        Review of Systems:  Review of Systems  Musculoskeletal:  Negative for gait problem.  Neurological:  Negative for tremors.   Psychiatric/Behavioral:         Please refer to HPI    Medications: I have reviewed the patient's current medications.  Current Outpatient Medications  Medication Sig Dispense Refill   albuterol (VENTOLIN HFA) 108 (90 Base) MCG/ACT inhaler Inhale 2 puffs into the lungs every 6 (six) hours as needed for wheezing or shortness of breath. 8 g 2   escitalopram (LEXAPRO) 10 MG tablet Take 1 tablet (10 mg total) by mouth daily. 30 tablet 0   QUEtiapine (SEROQUEL) 400 MG tablet TAKE 1 TABLET BY MOUTH EVERYDAY AT BEDTIME 30 tablet 0   No current facility-administered medications for this visit.    Medication Side Effects: None  Allergies:  Allergies  Allergen Reactions   Other Hives    Stone fruit   Shellfish Allergy Hives   Egg-Derived Products Nausea And Vomiting    Past Medical History:  Diagnosis Date   Asthma    Depression    Lumbar herniated disc    MDD (major depressive disorder), recurrent severe, without psychosis (HCC) 07/23/2019   Schizophrenia (HCC)    Tetanus     Past Medical History, Surgical history, Social history, and Family history were reviewed and updated as appropriate.   Please see review of systems for further details on the patient's review from today.   Objective:   Physical Exam:  There were no vitals taken for this visit.  Physical Exam Constitutional:      General: She is not in acute distress. Musculoskeletal:        General: No deformity.  Neurological:  Mental Status: She is alert and oriented to person, place, and time.     Coordination: Coordination normal.  Psychiatric:        Attention and Perception: Attention and perception normal. She does not perceive auditory or visual hallucinations.        Mood and Affect: Affect is not labile, blunt, angry or inappropriate.        Speech: Speech normal.        Behavior: Behavior normal.        Thought Content: Thought content normal. Thought content is not paranoid or delusional. Thought  content does not include homicidal or suicidal ideation. Thought content does not include homicidal or suicidal plan.        Cognition and Memory: Cognition and memory normal.        Judgment: Judgment normal.     Comments: Insight intact     Lab Review:     Component Value Date/Time   NA 137 08/20/2022 1506   K 3.6 08/20/2022 1506   CL 105 08/20/2022 1506   CO2 24 08/20/2022 1506   GLUCOSE 101 (H) 08/20/2022 1506   BUN 13 08/20/2022 1506   CREATININE 0.79 08/20/2022 1506   CALCIUM 9.5 08/20/2022 1506   PROT 8.3 (H) 05/19/2020 1750   ALBUMIN 5.0 05/19/2020 1750   AST 16 05/19/2020 1750   ALT 9 05/19/2020 1750   ALKPHOS 57 05/19/2020 1750   BILITOT 0.8 05/19/2020 1750   GFRNONAA >60 08/20/2022 1506   GFRAA >60 05/19/2020 1750       Component Value Date/Time   WBC 5.8 08/20/2022 1506   RBC 4.08 08/20/2022 1506   HGB 12.1 08/20/2022 1506   HCT 36.5 08/20/2022 1506   PLT 279 08/20/2022 1506   MCV 89.5 08/20/2022 1506   MCH 29.7 08/20/2022 1506   MCHC 33.2 08/20/2022 1506   RDW 13.1 08/20/2022 1506   LYMPHSABS 1.1 08/20/2022 1506   MONOABS 0.3 08/20/2022 1506   EOSABS 0.2 08/20/2022 1506   BASOSABS 0.0 08/20/2022 1506    No results found for: "POCLITH", "LITHIUM"   No results found for: "PHENYTOIN", "PHENOBARB", "VALPROATE", "CBMZ"   .res Assessment: Plan:    Plan:  1. Seroquel 400mg  at bedtime   2. Lexapro 10mg  daly   Continue therapy  RTC 6 months  Patient advised to contact office with any questions, adverse effects, or acute worsening in signs and symptoms.  Discussed potential metabolic side effects associated with atypical antipsychotics, as well as potential risk for movement side effects. Advised pt to contact office if movement side effects occur.  Diagnoses and all orders for this visit:  Generalized anxiety disorder  Schizoaffective disorder, depressive type (HCC)     Please see After Visit Summary for patient specific  instructions.  Future Appointments  Date Time Provider Department Center  02/06/2024  2:00 PM Kaelene Elliston, Thereasa Solo, NP [facility] None    No orders of the defined types were placed in this encounter.   -------------------------------

## 2023-09-27 ENCOUNTER — Other Ambulatory Visit: Payer: Self-pay | Admitting: Adult Health

## 2023-09-27 DIAGNOSIS — F411 Generalized anxiety disorder: Secondary | ICD-10-CM

## 2023-09-27 DIAGNOSIS — F251 Schizoaffective disorder, depressive type: Secondary | ICD-10-CM

## 2023-09-27 NOTE — Telephone Encounter (Signed)
This should be refused- the rx was written for 5 rf's . Am I correct?

## 2023-12-31 ENCOUNTER — Other Ambulatory Visit: Payer: Self-pay | Admitting: Family Medicine

## 2024-01-01 NOTE — Telephone Encounter (Signed)
 Please call patient:  I have not seen her since August of 2023. Received refill request for albuterol inhaler. She will need a follow up visit for further refills. Does she have any albuterol on hand now?

## 2024-01-02 NOTE — Telephone Encounter (Signed)
 Noted.

## 2024-01-02 NOTE — Telephone Encounter (Signed)
 Called and spoke with patient, she has plenty on hand at home, she does not need a refill at this time. Scheduled CPE in April.

## 2024-01-30 ENCOUNTER — Ambulatory Visit: Payer: 59 | Admitting: Adult Health

## 2024-01-31 ENCOUNTER — Ambulatory Visit (INDEPENDENT_AMBULATORY_CARE_PROVIDER_SITE_OTHER): Admitting: Primary Care

## 2024-01-31 ENCOUNTER — Encounter: Payer: Self-pay | Admitting: Primary Care

## 2024-01-31 VITALS — BP 122/76 | HR 81 | Temp 98.2°F | Ht 61.0 in | Wt 156.0 lb

## 2024-01-31 DIAGNOSIS — Z8619 Personal history of other infectious and parasitic diseases: Secondary | ICD-10-CM | POA: Diagnosis not present

## 2024-01-31 DIAGNOSIS — F339 Major depressive disorder, recurrent, unspecified: Secondary | ICD-10-CM | POA: Diagnosis not present

## 2024-01-31 DIAGNOSIS — Z0000 Encounter for general adult medical examination without abnormal findings: Secondary | ICD-10-CM

## 2024-01-31 DIAGNOSIS — J4520 Mild intermittent asthma, uncomplicated: Secondary | ICD-10-CM | POA: Diagnosis not present

## 2024-01-31 DIAGNOSIS — R87612 Low grade squamous intraepithelial lesion on cytologic smear of cervix (LGSIL): Secondary | ICD-10-CM

## 2024-01-31 DIAGNOSIS — F251 Schizoaffective disorder, depressive type: Secondary | ICD-10-CM

## 2024-01-31 NOTE — Progress Notes (Signed)
 Established Patient Office Visit  Subjective   Patient ID: Jill Osborne, female    DOB: 26-Nov-2000  Age: 23 y.o. MRN: [id number]  Chief Complaint  Patient presents with   Annual Exam    Not fasting     HPI Jill Osborne is a 23 year old female with history of asthma, MDD, schizoaffective disorder, PTSD, tetanus who presents today for annual physical exam and follow up of chronic conditions. She would like to discuss history of tetanus and persistent left oromandibular dystonia.   Immunizations: -Tetanus: Completed in 2020 -Influenza: not completed   Eye exam: Completes annually  Dental exam: Completes annually   Pap Smear: Completed 08/2022 with OB-Gyn  She reports increased use of albuterol of 1-2x/weekly for 2 months, related to cold and allergies, for symptoms of chest tightness. This increased frequency has resolved with resolution of cold/allergy symptoms. Now uses occasionally for triggers of exercise or fragrance. Last use 3-4 days ago at work due to strong fragrance.   She follows with R. Mozingo NP with Behavioral Health and feels she is doing well on lexapro and seroquel.  H/O tetanus with oromandibular dystonia  Notes difficulty drinking out of the side of a cup and occasionally water bottle, no issue with straws. This is a chronic issue, has not worsened. Notes leaking and clicking sounds. Notes uneven smile. No facial muscle weakness or numbness. She has been doing isometric exercises for face and neck with some improvement in symptoms.     Review of Systems  HENT:  Negative for congestion, ear pain and hearing loss.   Eyes:  Negative for blurred vision and double vision.  Respiratory:  Negative for shortness of breath.   Cardiovascular:  Negative for chest pain and palpitations.  Gastrointestinal:  Negative for constipation, diarrhea, heartburn, nausea and vomiting.  Genitourinary:  Negative for dysuria and frequency.  Musculoskeletal:  Negative for joint pain and  myalgias.  Neurological:  Negative for dizziness and headaches.  Psychiatric/Behavioral:  Negative for depression. The patient is not nervous/anxious.       Objective:     BP 122/76   Pulse 81   Temp 98.2 F (36.8 C) (Temporal)   Ht 5\' 1"  (1.549 m)   Wt 70.8 kg   LMP 12/29/2023   SpO2 98%   BMI 29.48 kg/m    Physical Exam Constitutional:      General: She is not in acute distress.    Appearance: Normal appearance. She is normal weight.  HENT:     Head: Normocephalic.     Right Ear: Tympanic membrane, ear canal and external ear normal.     Left Ear: Tympanic membrane, ear canal and external ear normal.  Eyes:     Extraocular Movements: Extraocular movements intact.     Conjunctiva/sclera: Conjunctivae normal.     Pupils: Pupils are equal, round, and reactive to light.  Cardiovascular:     Rate and Rhythm: Normal rate and regular rhythm.     Pulses:          Radial pulses are 3+ on the right side and 3+ on the left side.       Posterior tibial pulses are 2+ on the right side and 2+ on the left side.     Heart sounds: Normal heart sounds.  Pulmonary:     Effort: Pulmonary effort is normal.     Breath sounds: Normal breath sounds.  Abdominal:     General: Abdomen is flat.     Palpations: Abdomen is  soft.     Tenderness: There is no abdominal tenderness.  Musculoskeletal:     Cervical back: Normal range of motion and neck supple.     Right lower leg: No edema.     Left lower leg: No edema.  Neurological:     Mental Status: She is alert.  Psychiatric:        Mood and Affect: Affect normal.        Speech: Speech normal.        Behavior: Behavior is cooperative.      No results found for any visits on 01/31/24.    The ASCVD Risk score (Arnett DK, et al., 2019) failed to calculate for the following reasons:   The 2019 ASCVD risk score is only valid for ages 50 to 23    Assessment & Plan:   Problem List Items Addressed This Visit       Respiratory    Mild intermittent asthma   Controlled per HPI PE reassuring  Continue albuterol 2 puffs Q6H prn for wheezing/shortness of breath.         Other   Preventative health care - Primary   Immunizations UTD. Pap smear due; declines today. She will follow up with OB-Gyn  Exam stable.  Follow up in 1 year for repeat physical.       Major depressive disorder   Following with psychiatry/behavioral health.  Controlled her patient.   Continue seroquel 400mg  at bedtime and lexapro 10mg  every day.       Schizoaffective disorder, depressive type [facility])   Following with psychiatry/behavioral health.  Controlled per patient.   Continue seroquel 400mg  at bedtime and lexapro 10mg  every day.       History of tetanus   Improving, unresolved symptoms, per HPI Reviewed 02/2019 ED notes - immunization updated at this time  Offered OT referral for oromandibular dystonia symptoms. She declines at this time; will update in MyChart if referral is requested.        Follow up in 1 year for annual exam, or sooner as needed.    Tran Arzuaga M Florance Paolillo, RN

## 2024-01-31 NOTE — Progress Notes (Signed)
 Subjective:    Patient ID: Jill Osborne, female    DOB: [birth year], 23 y.o.   MRN: [id number]  HPI  Jill Osborne is a very pleasant 23 y.o. female who presents today for complete physical and follow up of chronic conditions.  She continues to notice mild, chronic left sided facial weakness since her tetany diagnosis. She mostly notices this with drinking out of a cup, and sometimes with a water bottle. The liquid within the cup will leak out of the side of her face. She's been doing facial exercises with improvement. She denies numbness, pain.   Immunizations: -Tetanus: Completed in 2020  -HPV: Completed series  Diet: Fair diet.  Exercise: No regular exercise.  Eye exam: Completes annually  Dental exam: Completes semi-annually    Pap Smear: Completed in November 2023, overdue for repeat PAP, follows with OB/GYN   BP Readings from Last 3 Encounters:  01/31/24 122/76  07/19/23 106/74  06/09/23 90/60      Review of Systems  Constitutional:  Negative for unexpected weight change.  HENT:  Negative for rhinorrhea.   Respiratory:  Negative for cough and shortness of breath.   Cardiovascular:  Negative for chest pain.  Gastrointestinal:  Negative for constipation and diarrhea.  Genitourinary:  Negative for difficulty urinating and menstrual problem.  Musculoskeletal:  Negative for arthralgias and myalgias.  Skin:  Negative for rash.  Allergic/Immunologic: Positive for environmental allergies.  Neurological:  Negative for dizziness, numbness and headaches.  Psychiatric/Behavioral:  The patient is not nervous/anxious.          Past Medical History:  Diagnosis Date   Asthma    Auditory hallucinations 03/23/2020   Depression    Fatigue 01/13/2020   Lumbar herniated disc    MDD (major depressive disorder), recurrent severe, without psychosis (HCC) 07/23/2019   Memory impairment 02/10/2020   Psychosis (HCC) 03/07/2020   Schizophrenia (HCC)    Seizure-like  activity (HCC) 02/10/2020   Tetanus    Transient confusion 02/10/2020   Unresponsive episode 02/10/2020   Weight loss, abnormal 01/13/2020    Social History   Socioeconomic History   Marital status: Single    Spouse name: Not on file   Number of children: Not on file   Years of education: Not on file   Highest education level: Not on file  Occupational History   Not on file  Tobacco Use   Smoking status: Never   Smokeless tobacco: Never  Vaping Use   Vaping status: Never Used  Substance and Sexual Activity   Alcohol use: Yes   Drug use: Yes    Types: Marijuana   Sexual activity: Yes    Partners: Male    Birth control/protection: None, Condom  Other Topics Concern   Not on file  Social History Narrative   Single.   Student at [employer], [employer].   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   Enjoys reading, spending time with friends.   Social Drivers of Corporate investment banker Strain: Not on file  Food Insecurity: Not on file  Transportation Needs: Not on file  Physical Activity: Not on file  Stress: Not on file  Social Connections: Not on file  Intimate Partner Violence: Not on file    Past Surgical History:  Procedure Laterality Date   LAPAROSCOPIC APPENDECTOMY N/A 07/15/2016   Procedure: APPENDECTOMY LAPAROSCOPIC;  Surgeon: Ricarda Frame, MD;  Location: [facility] ORS;  Service: General;  Laterality: N/A;    Family History  Problem Relation Age  of Onset   Healthy Mother    Healthy Father     Allergies  Allergen Reactions   Other Hives    Stone fruit   Shellfish Allergy Hives   Egg-Derived Products Nausea And Vomiting    Current Outpatient Medications on File Prior to Visit  Medication Sig Dispense Refill   albuterol (VENTOLIN HFA) 108 (90 Base) MCG/ACT inhaler Inhale 2 puffs into the lungs every 6 (six) hours as needed for wheezing or shortness of breath. 8 g 2   escitalopram (LEXAPRO) 10 MG tablet TAKE 1 TABLET BY MOUTH EVERY DAY 90 tablet 1   QUEtiapine  (SEROQUEL) 400 MG tablet TAKE 1 TABLET BY MOUTH EVERYDAY AT BEDTIME 90 tablet 1   No current facility-administered medications on file prior to visit.    BP 122/76   Pulse 81   Temp 98.2 F (36.8 C) (Temporal)   Ht 5\' 1"  (1.549 m)   Wt 156 lb (70.8 kg)   LMP 12/29/2023   SpO2 98%   BMI 29.48 kg/m  Objective:   Physical Exam HENT:     Right Ear: Tympanic membrane and ear canal normal.     Left Ear: Tympanic membrane and ear canal normal.  Eyes:     Pupils: Pupils are equal, round, and reactive to light.  Cardiovascular:     Rate and Rhythm: Normal rate and regular rhythm.  Pulmonary:     Effort: Pulmonary effort is normal.     Breath sounds: Normal breath sounds.  Abdominal:     General: Bowel sounds are normal.     Palpations: Abdomen is soft.     Tenderness: There is no abdominal tenderness.  Musculoskeletal:        General: Normal range of motion.     Cervical back: Neck supple.  Skin:    General: Skin is warm and dry.  Neurological:     Mental Status: She is alert and oriented to person, place, and time.     Cranial Nerves: No cranial nerve deficit.     Deep Tendon Reflexes:     Reflex Scores:      Patellar reflexes are 2+ on the right side and 2+ on the left side. Psychiatric:        Mood and Affect: Mood normal.           Assessment & Plan:  Preventative health care Assessment & Plan: Immunizations UTD. Pap smear due; declines today. She will follow up with OB-Gyn  Exam stable.  Follow up in 1 year for repeat physical.  I evaluated patient, was consulted regarding treatment, and agree with assessment and plan per Julaine Fusi, MSN, FNP student.   Mayra Reel, NP-C    Schizoaffective disorder, depressive type [facility]) Assessment & Plan: Following with psychiatry/behavioral health.  Controlled per patient.   Continue seroquel 400mg  at bedtime and lexapro 10mg  every day.   I evaluated patient, was consulted regarding treatment, and agree with  assessment and plan per Julaine Fusi, MSN, FNP student.   Mayra Reel, NP-C    Mild intermittent asthma without complication Assessment & Plan: Controlled per HPI PE reassuring  Continue albuterol 2 puffs Q6H prn for wheezing/shortness of breath.   I evaluated patient, was consulted regarding treatment, and agree with assessment and plan per Julaine Fusi, MSN, FNP student.   Mayra Reel, NP-C    History of tetanus Assessment & Plan: Stable with mild residual symptoms. Reviewed 02/2019 ED notes - immunization updated at this time  Offered  OT referral for oromandibular dystonia symptoms. She declines at this time; will update in MyChart if referral is requested.   I evaluated patient, was consulted regarding treatment, and agree with assessment and plan per Jenna Elkins, MSN, FNP student.   Aneta Bar, NP-C    Recurrent major depressive disorder, remission status unspecified (HCC) Assessment & Plan: Following with psychiatry/behavioral health.  Reviewed office notes from October 2024. Controlled her patient.   Continue seroquel 400mg  at bedtime and lexapro 10mg  every day.   I evaluated patient, was consulted regarding treatment, and agree with assessment and plan per Jenna Elkins, MSN, FNP student.   Aneta Bar, NP-C    LGSIL on Pap smear of cervix Assessment & Plan: Repeat Pap overdue. Offered repeat Pap smear today, patient declines. She will schedule appoint with OB/GYN.         Lyly Canizales K Misael Mcgaha, NP

## 2024-01-31 NOTE — Assessment & Plan Note (Addendum)
 Following with psychiatry/behavioral health.  Reviewed office notes from October 2024. Controlled her patient.   Continue seroquel 400mg  at bedtime and lexapro 10mg  every day.   I evaluated patient, was consulted regarding treatment, and agree with assessment and plan per Bassel Gaskill, MSN, FNP student.   Aneta Bar, NP-C

## 2024-01-31 NOTE — Assessment & Plan Note (Addendum)
 Following with psychiatry/behavioral health.  Controlled per patient.   Continue seroquel 400mg  at bedtime and lexapro 10mg  every day.   I evaluated patient, was consulted regarding treatment, and agree with assessment and plan per Thelma Viana, MSN, FNP student.   Aneta Bar, NP-C

## 2024-01-31 NOTE — Assessment & Plan Note (Addendum)
 Stable with mild residual symptoms. Reviewed 02/2019 ED notes - immunization updated at this time  Offered OT referral for oromandibular dystonia symptoms. She declines at this time; will update in MyChart if referral is requested.   I evaluated patient, was consulted regarding treatment, and agree with assessment and plan per Christophr Calix, MSN, FNP student.   Aneta Bar, NP-C

## 2024-01-31 NOTE — Assessment & Plan Note (Addendum)
 Immunizations UTD. Pap smear due; declines today. She will follow up with OB-Gyn  Exam stable.  Follow up in 1 year for repeat physical.  I evaluated patient, was consulted regarding treatment, and agree with assessment and plan per Daysia Vandenboom, MSN, FNP student.   Aneta Bar, NP-C

## 2024-01-31 NOTE — Assessment & Plan Note (Addendum)
 Controlled per HPI PE reassuring  Continue albuterol 2 puffs Q6H prn for wheezing/shortness of breath.   I evaluated patient, was consulted regarding treatment, and agree with assessment and plan per Carole Doner, MSN, FNP student.   Aneta Bar, NP-C

## 2024-01-31 NOTE — Patient Instructions (Signed)
 Medications:  No changes  Follow up: 1 year for annual exam   Contact office if you wish to have occupational therapy referral   Contact OB-Gyn office to schedule annual exam

## 2024-01-31 NOTE — Assessment & Plan Note (Signed)
 Repeat Pap overdue. Offered repeat Pap smear today, patient declines. She will schedule appoint with OB/GYN.

## 2024-02-06 ENCOUNTER — Ambulatory Visit: Payer: 59 | Admitting: Adult Health

## 2024-02-08 ENCOUNTER — Telehealth (INDEPENDENT_AMBULATORY_CARE_PROVIDER_SITE_OTHER): Admitting: Adult Health

## 2024-02-08 ENCOUNTER — Encounter: Payer: Self-pay | Admitting: Adult Health

## 2024-02-08 DIAGNOSIS — F4310 Post-traumatic stress disorder, unspecified: Secondary | ICD-10-CM

## 2024-02-08 DIAGNOSIS — F251 Schizoaffective disorder, depressive type: Secondary | ICD-10-CM | POA: Diagnosis not present

## 2024-02-08 DIAGNOSIS — G4700 Insomnia, unspecified: Secondary | ICD-10-CM | POA: Diagnosis not present

## 2024-02-08 DIAGNOSIS — F411 Generalized anxiety disorder: Secondary | ICD-10-CM | POA: Diagnosis not present

## 2024-02-08 MED ORDER — QUETIAPINE FUMARATE 400 MG PO TABS: ORAL_TABLET | ORAL | 1 refills | Status: DC

## 2024-02-08 MED ORDER — ESCITALOPRAM OXALATE 10 MG PO TABS: 10.0000 mg | ORAL_TABLET | Freq: Every day | ORAL | 1 refills | Status: AC

## 2024-02-08 NOTE — Progress Notes (Signed)
 Jill Osborne [id number] Oct 14, 2001 22 y.o.  Virtual Visit via Video Note  I connected with pt @ on 02/08/24 at 10:30 AM EDT by a video enabled telemedicine application and verified that I am speaking with the correct person using two identifiers.   I discussed the limitations of evaluation and management by telemedicine and the availability of in person appointments. The patient expressed understanding and agreed to proceed.  I discussed the assessment and treatment plan with the patient. The patient was provided an opportunity to ask questions and all were answered. The patient agreed with the plan and demonstrated an understanding of the instructions.   The patient was advised to call back or seek an in-person evaluation if the symptoms worsen or if the condition fails to improve as anticipated.  I provided 25 minutes of non-face-to-face time during this encounter.  The patient was located at home.  The provider was located at [facility].   Jill Camera, NP   Subjective:   Patient ID:  Jill Osborne is a 23 y.o. (DOB Aug 02, 2001) female.  Chief Complaint: No chief complaint on file.   HPI Jill Osborne presents for follow-up of Schizophrenia, GAD, PTSD, and insomnia.  Describes mood today as "ok". Pleasant. Denies tearfulness. Mood symptoms - denies depression, anxiety and irritability. Reports stable interest and motivation. Denies panic attacks. Reports worry, rumination and over thinking. Reports feeling less over whelmed. Mood is stable. Stating "I feel like I'm doing good". Feels like medications - Seroquel  and Lexapro  continue to work well. Taking medications as prescribed.  Energy levels stable. Active, has a regular exercise routine.   Enjoys some usual interests and activities. Single. Has a boyfriend. Lives with parents and 2 siblings. Spending time with family. Appetite stable. Weight gain - 155 pounds.  Sleeping well most nights. Averages 5 to 14   hours. Focus and concentration stable. Completing tasks. Managing aspects of household. Working full time at [employer].  Denies SI or HI. Denies AH.  Denies VH.   Denies self harm. Denies substance use. Denies paranoia.   Review of Systems:  Review of Systems  Medications: I have reviewed the patient's current medications.  Current Outpatient Medications  Medication Sig Dispense Refill   albuterol  (VENTOLIN  HFA) 108 (90 Base) MCG/ACT inhaler Inhale 2 puffs into the lungs every 6 (six) hours as needed for wheezing or shortness of breath. 8 g 2   escitalopram  (LEXAPRO ) 10 MG tablet TAKE 1 TABLET BY MOUTH EVERY DAY 90 tablet 1   QUEtiapine  (SEROQUEL ) 400 MG tablet TAKE 1 TABLET BY MOUTH EVERYDAY AT BEDTIME 90 tablet 1   No current facility-administered medications for this visit.    Medication Side Effects: None  Allergies:  Allergies  Allergen Reactions   Other Hives    Stone fruit   Shellfish Allergy Hives   Egg-Derived Products Nausea And Vomiting    Past Medical History:  Diagnosis Date   Asthma    Auditory hallucinations 03/23/2020   Depression    Fatigue 01/13/2020   Lumbar herniated disc    MDD (major depressive disorder), recurrent severe, without psychosis (HCC) 07/23/2019   Memory impairment 02/10/2020   Psychosis (HCC) 03/07/2020   Schizophrenia (HCC)    Seizure-like activity (HCC) 02/10/2020   Tetanus    Transient confusion 02/10/2020   Unresponsive episode 02/10/2020   Weight loss, abnormal 01/13/2020    Family History  Problem Relation Age of Onset   Healthy Mother    Healthy Father  Social History   Socioeconomic History   Marital status: Single    Spouse name: Not on file   Number of children: Not on file   Years of education: Not on file   Highest education level: Not on file  Occupational History   Not on file  Tobacco Use   Smoking status: Never   Smokeless tobacco: Never  Vaping Use   Vaping status: Never Used  Substance and  Sexual Activity   Alcohol use: Yes   Drug use: Yes    Types: Marijuana   Sexual activity: Yes    Partners: Male    Birth control/protection: None, Condom  Other Topics Concern   Not on file  Social History Narrative   Single.   Student at [employer], [employer].   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   Enjoys reading, spending time with friends.   Social Drivers of Corporate investment banker Strain: Not on file  Food Insecurity: Not on file  Transportation Needs: Not on file  Physical Activity: Not on file  Stress: Not on file  Social Connections: Not on file  Intimate Partner Violence: Not on file    Past Medical History, Surgical history, Social history, and Family history were reviewed and updated as appropriate.   Please see review of systems for further details on the patient's review from today.   Objective:   Physical Exam:  LMP 12/29/2023   Physical Exam  Lab Review:     Component Value Date/Time   NA 137 08/20/2022 1506   K 3.6 08/20/2022 1506   CL 105 08/20/2022 1506   CO2 24 08/20/2022 1506   GLUCOSE 101 (H) 08/20/2022 1506   BUN 13 08/20/2022 1506   CREATININE 0.79 08/20/2022 1506   CALCIUM 9.5 08/20/2022 1506   PROT 8.3 (H) 05/19/2020 1750   ALBUMIN 5.0 05/19/2020 1750   AST 16 05/19/2020 1750   ALT 9 05/19/2020 1750   ALKPHOS 57 05/19/2020 1750   BILITOT 0.8 05/19/2020 1750   GFRNONAA >60 08/20/2022 1506   GFRAA >60 05/19/2020 1750       Component Value Date/Time   WBC 5.8 08/20/2022 1506   RBC 4.08 08/20/2022 1506   HGB 12.1 08/20/2022 1506   HCT 36.5 08/20/2022 1506   PLT 279 08/20/2022 1506   MCV 89.5 08/20/2022 1506   MCH 29.7 08/20/2022 1506   MCHC 33.2 08/20/2022 1506   RDW 13.1 08/20/2022 1506   LYMPHSABS 1.1 08/20/2022 1506   MONOABS 0.3 08/20/2022 1506   EOSABS 0.2 08/20/2022 1506   BASOSABS 0.0 08/20/2022 1506    No results found for: "POCLITH", "LITHIUM"   No results found for: "PHENYTOIN", "PHENOBARB", "VALPROATE", "CBMZ"    .res Assessment: Plan:    Plan:  Continue 1. Seroquel  400mg  at bedtime   2. Lexapro  10mg  daly   Continue therapy  RTC 6 months  25 minutes spent dedicated to the care of this patient on the date of this encounter to include pre-visit review of records, ordering of medication, post visit documentation, and face-to-face time with the patient discussing Schizophrenia, GAD, PTSD, and insomnia. Discussed continuing current medication regimen.  Discussed potential metabolic side effects associated with atypical antipsychotics, as well as potential risk for movement side effects. Advised pt to contact office if movement side effects occur.   Patient advised to contact office with any questions, adverse effects, or acute worsening in signs and symptoms.  There are no diagnoses linked to this encounter.   Please see  After Visit Summary for patient specific instructions.  Future Appointments  Date Time Provider Department Center  02/08/2024 10:30 AM Korrine Sicard Nattalie, NP CP-CP None    No orders of the defined types were placed in this encounter.     -------------------------------

## 2024-04-20 ENCOUNTER — Telehealth: Admitting: Nurse Practitioner

## 2024-04-20 DIAGNOSIS — R399 Unspecified symptoms and signs involving the genitourinary system: Secondary | ICD-10-CM | POA: Diagnosis not present

## 2024-04-20 MED ORDER — NITROFURANTOIN MONOHYD MACRO 100 MG PO CAPS: 100.0000 mg | ORAL_CAPSULE | Freq: Two times a day (BID) | ORAL | 0 refills | Status: AC

## 2024-04-20 NOTE — Progress Notes (Signed)

## 2024-04-20 NOTE — Progress Notes (Signed)
 I have spent 5 minutes in review of e-visit questionnaire, review and updating patient chart, medical decision making and response to patient.   Claiborne Rigg, NP

## 2024-04-25 ENCOUNTER — Encounter: Payer: Self-pay | Admitting: Family Medicine

## 2024-04-25 ENCOUNTER — Other Ambulatory Visit (HOSPITAL_COMMUNITY)
Admission: RE | Admit: 2024-04-25 | Discharge: 2024-04-25 | Disposition: A | Discharge status: Home / Self Care | Source: Ambulatory Visit | Attending: Family Medicine | Admitting: Family Medicine

## 2024-04-25 ENCOUNTER — Ambulatory Visit (INDEPENDENT_AMBULATORY_CARE_PROVIDER_SITE_OTHER): Admitting: Family Medicine

## 2024-04-25 VITALS — BP 108/75 | HR 83 | Wt 156.0 lb

## 2024-04-25 DIAGNOSIS — R102 Pelvic and perineal pain: Secondary | ICD-10-CM

## 2024-04-25 MED ORDER — DOXYCYCLINE HYCLATE 100 MG PO CAPS: 100.0000 mg | ORAL_CAPSULE | Freq: Two times a day (BID) | ORAL | 0 refills | Status: AC

## 2024-04-25 NOTE — Progress Notes (Signed)
   Subjective:    Patient ID: Jill Osborne is a 23 y.o. female presenting with Abdominal Pain  on 04/25/2024  HPI: Reports pelvic pain x 1 month. Denies vaginal discharge. No fever, no chills. ? UTI, took Abx. Reports pain with intercourse and painful defecation. Has tried tylenol .  Has Kyleena  in place since changed 07/2023. Normal cycles.  Review of Systems  Constitutional:  Negative for chills and fever.  Respiratory:  Negative for shortness of breath.   Cardiovascular:  Negative for chest pain.  Gastrointestinal:  Positive for abdominal pain, nausea and vomiting.       Anorexia  Genitourinary:  Negative for dysuria.  Skin:  Negative for rash.      Objective:    BP 108/75   Pulse 83   Wt 156 lb (70.8 kg)   LMP 04/07/2024 (Exact Date)   BMI 29.48 kg/m  Physical Exam Exam conducted with a chaperone present.  Constitutional:      General: She is not in acute distress.    Appearance: She is well-developed.  HENT:     Head: Normocephalic and atraumatic.  Eyes:     General: No scleral icterus. Cardiovascular:     Rate and Rhythm: Normal rate.  Pulmonary:     Effort: Pulmonary effort is normal.  Abdominal:     Palpations: Abdomen is soft.     Tenderness: There is no abdominal tenderness. There is no guarding or rebound.  Genitourinary:    General: Normal vulva.     Cervix: Cervical motion tenderness and discharge present.     Uterus: Tender.      Adnexa:        Right: Tenderness present. No mass.         Left: Tenderness present. No mass.       Comments: IUD strings noted Musculoskeletal:     Cervical back: Neck supple.  Skin:    General: Skin is warm and dry.  Neurological:     Mental Status: She is alert and oriented to person, place, and time.         Assessment & Plan:   Problem List Items Addressed This Visit   None Visit Diagnoses       Pelvic pain    -  Primary   suspect PID. Rx'd and check cultures and pelvic sonogram.   Relevant Medications    doxycycline  (VIBRAMYCIN ) 100 MG capsule   Other Relevant Orders   Cervicovaginal ancillary only( [facility])   US  PELVIC COMPLETE WITH TRANSVAGINAL       Return in about 4 weeks (around 05/23/2024) for needs U/S.  Jill GORMAN Birk, MD 04/25/2024 2:37 PM

## 2024-04-25 NOTE — Progress Notes (Signed)
 RGYN here for problem visit today. c/o Pelvic pain x 2 wks now. Recently finished antibiotic for UTI, notes pain w/ intercourse and pain w/ walking describes pain as stabbing  *Pt advised to try and leave urine if needed and made aware may do BV/STD Screening.  LMP: 04/07/24 Contraception: IUD reinserted 07/19/23

## 2024-04-29 LAB — CERVICOVAGINAL ANCILLARY ONLY
Bacterial Vaginitis (gardnerella): NEGATIVE
Candida Glabrata: NEGATIVE
Candida Vaginitis: NEGATIVE
Chlamydia: NEGATIVE
Comment: NEGATIVE
Comment: NEGATIVE
Comment: NEGATIVE
Comment: NEGATIVE
Comment: NEGATIVE
Comment: NORMAL
Neisseria Gonorrhea: NEGATIVE
Trichomonas: NEGATIVE

## 2024-04-30 ENCOUNTER — Ambulatory Visit: Payer: Self-pay | Admitting: Family Medicine

## 2024-05-01 ENCOUNTER — Ambulatory Visit
Admission: RE | Admit: 2024-05-01 | Discharge: 2024-05-01 | Disposition: A | Discharge status: Home / Self Care | Source: Ambulatory Visit | Attending: Family Medicine | Admitting: Family Medicine

## 2024-05-01 DIAGNOSIS — R102 Pelvic and perineal pain: Secondary | ICD-10-CM | POA: Diagnosis present

## 2024-05-03 ENCOUNTER — Other Ambulatory Visit: Payer: Self-pay

## 2024-05-03 ENCOUNTER — Emergency Department

## 2024-05-03 ENCOUNTER — Emergency Department
Admission: EM | Admit: 2024-05-03 | Discharge: 2024-05-04 | Disposition: A | Discharge status: Home / Self Care | Attending: Emergency Medicine | Admitting: Emergency Medicine

## 2024-05-03 ENCOUNTER — Encounter: Payer: Self-pay | Admitting: Emergency Medicine

## 2024-05-03 DIAGNOSIS — R112 Nausea with vomiting, unspecified: Secondary | ICD-10-CM | POA: Insufficient documentation

## 2024-05-03 DIAGNOSIS — R1084 Generalized abdominal pain: Secondary | ICD-10-CM | POA: Insufficient documentation

## 2024-05-03 DIAGNOSIS — R197 Diarrhea, unspecified: Secondary | ICD-10-CM | POA: Diagnosis not present

## 2024-05-03 LAB — URINALYSIS, ROUTINE W REFLEX MICROSCOPIC
Bilirubin Urine: NEGATIVE
Glucose, UA: NEGATIVE mg/dL
Hgb urine dipstick: NEGATIVE
Ketones, ur: NEGATIVE mg/dL
Leukocytes,Ua: NEGATIVE
Nitrite: NEGATIVE
Protein, ur: NEGATIVE mg/dL
Specific Gravity, Urine: 1.018 (ref 1.005–1.030)
pH: 7 (ref 5.0–8.0)

## 2024-05-03 LAB — CBC
HCT: 38.6 % (ref 36.0–46.0)
Hemoglobin: 13 g/dL (ref 12.0–15.0)
MCH: 30.4 pg (ref 26.0–34.0)
MCHC: 33.7 g/dL (ref 30.0–36.0)
MCV: 90.2 fL (ref 80.0–100.0)
Platelets: 239 K/uL (ref 150–400)
RBC: 4.28 MIL/uL (ref 3.87–5.11)
RDW: 12.6 % (ref 11.5–15.5)
WBC: 7.7 K/uL (ref 4.0–10.5)
nRBC: 0 % (ref 0.0–0.2)

## 2024-05-03 LAB — COMPREHENSIVE METABOLIC PANEL WITH GFR
ALT: 15 U/L (ref 0–44)
AST: 21 U/L (ref 15–41)
Albumin: 5 g/dL (ref 3.5–5.0)
Alkaline Phosphatase: 42 U/L (ref 38–126)
Anion gap: 11 (ref 5–15)
BUN: 13 mg/dL (ref 6–20)
CO2: 22 mmol/L (ref 22–32)
Calcium: 9.7 mg/dL (ref 8.9–10.3)
Chloride: 106 mmol/L (ref 98–111)
Creatinine, Ser: 0.9 mg/dL (ref 0.44–1.00)
GFR, Estimated: >60 mL/min (ref >60)
Glucose, Bld: 104 mg/dL — ABNORMAL HIGH (ref 70–99)
Potassium: 3.6 mmol/L (ref 3.5–5.1)
Sodium: 139 mmol/L (ref 135–145)
Total Bilirubin: 0.7 mg/dL (ref 0.0–1.2)
Total Protein: 8.2 g/dL — ABNORMAL HIGH (ref 6.5–8.1)

## 2024-05-03 LAB — LIPASE, BLOOD: Lipase: 48 U/L (ref 11–51)

## 2024-05-03 LAB — POC URINE PREG, ED: Preg Test, Ur: NEGATIVE

## 2024-05-03 MED ORDER — KETOROLAC TROMETHAMINE 15 MG/ML IJ SOLN
15.0000 mg | Freq: Once | INTRAMUSCULAR | Status: AC
  Administered 2024-05-03: 15 mg via INTRAVENOUS
  Filled 2024-05-03: qty 1

## 2024-05-03 MED ORDER — ONDANSETRON HCL 4 MG/2ML IJ SOLN
4.0000 mg | Freq: Once | INTRAMUSCULAR | Status: AC
  Administered 2024-05-03: 4 mg via INTRAVENOUS
  Filled 2024-05-03: qty 2

## 2024-05-03 MED ORDER — IOHEXOL 300 MG/ML  SOLN
100.0000 mL | Freq: Once | INTRAMUSCULAR | Status: AC | PRN
  Administered 2024-05-03: 100 mL via INTRAVENOUS

## 2024-05-03 NOTE — ED Notes (Signed)
 Pt presented to ED with c/o LUQ abd pain starting 04/19/24. Pt states was seen by teledoc and GYN over the last 2 weeks and was prescribed abx for UTI. States started with diarrhea and vomiting today. States pain had improved after starting abx but returned today. Pt actively vomiting in room. Mother at bedside, bed in lowest position, call bell within reach.

## 2024-05-03 NOTE — ED Notes (Signed)
Urine sample sent to lab at this time ?

## 2024-05-03 NOTE — ED Triage Notes (Addendum)
 Pt reports upper abd pain that started at the beginning of the week and n/v/d that started today.  Pt reports taking tylenol  and pepto around 1700

## 2024-05-03 NOTE — ED Notes (Signed)
 Wells MD at bedside.

## 2024-05-03 NOTE — ED Notes (Signed)
 Blue, green, and lab collected and sent to lab

## 2024-05-03 NOTE — ED Provider Notes (Signed)
 [facility] Provider Note    Event Date/Time   First MD Initiated Contact with Patient 05/03/24 2257     (approximate)   History   Abdominal Pain, Vomiting (diar), and Diarrhea   HPI AERIE DONICA is a 23 y.o. female presenting today for abdominal pain.  Patient states today she has had intermittent abdominal pain symptoms in her epigastric and left lower quadrant region.  When the pain comes on she then has nausea with vomiting as well as diarrhea.  Pain symptoms are not constant and not present at this time.  Denies fever, chills, cough, congestion.  No blood in her vomit or stools.  No dysuria.  States she has been on 2 different antibiotics in the past several weeks.  No other vaginal bleeding or discharge.  Prior abdominal surgery history includes appendectomy.     Physical Exam   Triage Vital Signs: ED Triage Vitals  Encounter Vitals Group     BP 05/03/24 2117 118/83     Girls Systolic BP Percentile --      Girls Diastolic BP Percentile --      Boys Systolic BP Percentile --      Boys Diastolic BP Percentile --      Pulse Rate 05/03/24 2117 73     Resp 05/03/24 2117 16     Temp 05/03/24 2117 98.4 F (36.9 C)     Temp Source 05/03/24 2117 Oral     SpO2 05/03/24 2117 100 %     Weight 05/03/24 2124 156 lb (70.8 kg)     Height 05/03/24 2124 5' (1.524 m)     Head Circumference --      Peak Flow --      Pain Score 05/03/24 2123 6     Pain Loc --      Pain Education --      Exclude from Growth Chart --     Most recent vital signs: Vitals:   05/03/24 2300 05/03/24 2330  BP: 113/84 109/73  Pulse: 77 70  Resp:    Temp:    SpO2: 100% 99%   Physical Exam: I have reviewed the vital signs and nursing notes. General: Awake, alert, no acute distress.  Nontoxic appearing. Head:  Atraumatic, normocephalic.   ENT:  EOM intact, PERRL. Oral mucosa is pink and moist with no lesions. Neck: Neck is supple with full range of motion Cardiovascular:   RRR, No murmurs. Peripheral pulses palpable and equal bilaterally. Respiratory:  Symmetrical chest wall expansion.  No rhonchi, rales, or wheezes.  Good air movement throughout.  No use of accessory muscles.   Musculoskeletal:  No cyanosis or edema. Moving extremities with full ROM Abdomen:  Soft, nontender, nondistended. Neuro:  GCS 15, moving all four extremities, interacting appropriately. Speech clear. Psych:  Calm, appropriate.   Skin:  Warm, dry, no rash.    ED Results / Procedures / Treatments   Labs (all labs ordered are listed, but only abnormal results are displayed) Labs Reviewed  COMPREHENSIVE METABOLIC PANEL WITH GFR - Abnormal; Notable for the following components:      Result Value   Glucose, Bld 104 (*)    Total Protein 8.2 (*)    All other components within normal limits  URINALYSIS, ROUTINE W REFLEX MICROSCOPIC - Abnormal; Notable for the following components:   Color, Urine YELLOW (*)    APPearance HAZY (*)    All other components within normal limits  LIPASE, BLOOD  CBC  POC  URINE PREG, ED     EKG    RADIOLOGY Independently interpreted CT abdomen/pelvis with no acute pathology   PROCEDURES:  Critical Care performed: No  Procedures   MEDICATIONS ORDERED IN ED: Medications  ondansetron  (ZOFRAN ) injection 4 mg (4 mg Intravenous Given 05/03/24 2319)  ketorolac  (TORADOL ) 15 MG/ML injection 15 mg (15 mg Intravenous Given 05/03/24 2319)  iohexol  (OMNIPAQUE ) 300 MG/ML solution 100 mL (100 mLs Intravenous Contrast Given 05/03/24 2346)     IMPRESSION / MDM / ASSESSMENT AND PLAN / ED COURSE  I reviewed the triage vital signs and the nursing notes.                              Differential diagnosis includes, but is not limited to, colitis, enteritis, medication induced diarrhea, IBS, gastritis, diverticulitis  Patient's presentation is most consistent with acute complicated illness / injury requiring diagnostic workup.  Patient is a 23 year old  female presenting today for intermittent abdominal pain symptoms associated with vomiting and diarrhea.  Her current abdominal exam is rather benign with no specific focal tenderness anywhere.  CBC, CMP, lipase all negative.  UA with no signs of infection and negative pregnancy test.  She has had intermittent symptoms like this in the past couple of weeks and placed on 2 antibiotics.  This may be secondary to some of her antibiotic usage at this time but no concern for C. difficile given low frequency of diarrhea.  Will get CT imaging of the abdomen/pelvis to rule out other obvious acute intra-abdominal infections.  Patient given Zofran  and Toradol  for symptomatic management.  CT abdomen/pelvis negative.  Patient reassessed with no ongoing symptoms.  This could be a combination of either diarrhea from antibiotics versus bowel spasms versus IBS.  Either way, I do not see any ongoing life-threatening pathology.  Will discharge with Bentyl  and Zofran  and PCP follow-up.  Patient given strict return precautions.     FINAL CLINICAL IMPRESSION(S) / ED DIAGNOSES   Final diagnoses:  Generalized abdominal pain  Nausea vomiting and diarrhea     Rx / DC Orders   ED Discharge Orders          Ordered    ondansetron  (ZOFRAN ) 4 MG tablet  Every 8 hours PRN        05/04/24 0039    dicyclomine  (BENTYL ) 10 MG capsule  3 times daily PRN        05/04/24 0039             Note:  This document was prepared using Dragon voice recognition software and may include unintentional dictation errors.   Malvina Alm DASEN, MD 05/04/24 [phone]

## 2024-05-04 MED ORDER — DICYCLOMINE HCL 10 MG PO CAPS: 10.0000 mg | ORAL_CAPSULE | Freq: Three times a day (TID) | ORAL | 0 refills | Status: AC | PRN

## 2024-05-04 MED ORDER — ONDANSETRON HCL 4 MG PO TABS: 4.0000 mg | ORAL_TABLET | Freq: Three times a day (TID) | ORAL | 0 refills | Status: AC | PRN

## 2024-05-04 NOTE — Discharge Instructions (Signed)
 Your laboratory workup was reassuring today and CT imaging of your abdomen shows no acute findings.  This may be related to bowel spasms or irritable bowel syndrome.  I have sent medications to your pharmacy to hopefully help treat the symptoms.  I would like you to see your primary care provider next week for reassessment.  If you have ongoing persistent symptoms, you may need to follow-up with a GI doctor outpatient.

## 2024-05-30 ENCOUNTER — Encounter: Payer: Self-pay | Admitting: Family Medicine

## 2024-05-30 ENCOUNTER — Ambulatory Visit: Admitting: Family Medicine

## 2024-05-30 NOTE — Progress Notes (Signed)
Patient did not keep appointment today. She will be called to reschedule.  

## 2024-07-11 ENCOUNTER — Ambulatory Visit: Admitting: Primary Care

## 2024-10-05 ENCOUNTER — Other Ambulatory Visit: Payer: Self-pay | Admitting: Adult Health

## 2024-10-05 DIAGNOSIS — F251 Schizoaffective disorder, depressive type: Secondary | ICD-10-CM

## 2024-10-06 NOTE — Telephone Encounter (Signed)
 Please call to schedule, was due in Oct.

## 2024-10-07 NOTE — Telephone Encounter (Signed)
 Lvm for pt to call and schedule

## 2024-10-09 ENCOUNTER — Other Ambulatory Visit: Payer: Self-pay | Admitting: Adult Health

## 2024-10-09 DIAGNOSIS — F251 Schizoaffective disorder, depressive type: Secondary | ICD-10-CM

## 2024-10-12 NOTE — Telephone Encounter (Signed)
Sent MyChart message to schedule FU.

## 2024-10-14 ENCOUNTER — Telehealth: Payer: Self-pay | Admitting: Adult Health

## 2024-10-14 ENCOUNTER — Other Ambulatory Visit: Payer: Self-pay | Admitting: Adult Health

## 2024-10-14 DIAGNOSIS — F251 Schizoaffective disorder, depressive type: Secondary | ICD-10-CM

## 2024-10-14 NOTE — Telephone Encounter (Signed)
 Patient called in for refill on Seroquel  400mg . PH: [phone] Appt 1/7 [facility] [address] [place]

## 2024-10-14 NOTE — Telephone Encounter (Signed)
 Pending with [employer]

## 2024-10-14 NOTE — Telephone Encounter (Signed)
 Received PA request in CMM. Have forwarded to Traci.

## 2024-10-14 NOTE — Telephone Encounter (Signed)
 [facility] [address] in [place]. They sent to [facility]. I called because I didn't know what the notation of needing a safety doc meant. Pharmacy asked if it needed a PA. Pt has been on this dose long-term and I don't see that a PA has been done or requested.

## 2024-10-15 NOTE — Telephone Encounter (Signed)
 PA approved 10/14/24-10/14/25  [employer] [id number]

## 2024-10-23 ENCOUNTER — Encounter: Payer: Self-pay | Admitting: Adult Health

## 2024-10-23 ENCOUNTER — Telehealth: Admitting: Adult Health

## 2024-10-23 DIAGNOSIS — Z0389 Encounter for observation for other suspected diseases and conditions ruled out: Secondary | ICD-10-CM

## 2024-10-23 NOTE — Progress Notes (Deleted)
 Jill Osborne [id number] 2001-09-20 23 y.o.  Virtual Visit via Video Note  I connected with pt @ on 10/23/2024 at 10:30 AM EST by a video enabled telemedicine application and verified that I am speaking with the correct person using two identifiers.   I discussed the limitations of evaluation and management by telemedicine and the availability of in person appointments. The patient expressed understanding and agreed to proceed.  I discussed the assessment and treatment plan with the patient. The patient was provided an opportunity to ask questions and all were answered. The patient agreed with the plan and demonstrated an understanding of the instructions.   The patient was advised to call back or seek an in-person evaluation if the symptoms worsen or if the condition fails to improve as anticipated.  I provided *** minutes of non-face-to-face time during this encounter.  The patient was located at home.  The provider was located at [facility].   Angeline LOISE Sayers, NP   Subjective:   Patient ID:  Jill Osborne is a 24 y.o. (DOB 08/12/2001) female.  Chief Complaint: No chief complaint on file.   HPI Jill Osborne presents for follow-up of ***   Review of Systems:  Review of Systems  Medications: {medication reviewed/display:3041432}  Current Outpatient Medications  Medication Sig Dispense Refill   albuterol  (VENTOLIN  HFA) 108 (90 Base) MCG/ACT inhaler Inhale 2 puffs into the lungs every 6 (six) hours as needed for wheezing or shortness of breath. 8 g 2   dicyclomine  (BENTYL ) 10 MG capsule Take 1 capsule (10 mg total) by mouth 3 (three) times daily as needed for spasms. 30 capsule 0   doxycycline  (VIBRAMYCIN ) 100 MG capsule Take 1 capsule (100 mg total) by mouth 2 (two) times daily. 20 capsule 0   escitalopram  (LEXAPRO ) 10 MG tablet Take 1 tablet (10 mg total) by mouth daily. 90 tablet 1   ondansetron  (ZOFRAN ) 4 MG tablet Take 1 tablet (4 mg total) by mouth every 8  (eight) hours as needed. 15 tablet 0   QUEtiapine  (SEROQUEL ) 400 MG tablet TAKE 1 TABLET BY MOUTH EVERYDAY AT BEDTIME 30 tablet 0   No current facility-administered medications for this visit.    Medication Side Effects: {Medication Side Effects (Optional):21014029}  Allergies: Allergies[1]  Past Medical History:  Diagnosis Date   Asthma    Auditory hallucinations 03/23/2020   Depression    Fatigue 01/13/2020   Lumbar herniated disc    MDD (major depressive disorder), recurrent severe, without psychosis (HCC) 07/23/2019   Memory impairment 02/10/2020   Psychosis (HCC) 03/07/2020   Schizophrenia (HCC)    Seizure-like activity (HCC) 02/10/2020   Tetanus    Transient confusion 02/10/2020   Unresponsive episode 02/10/2020   Weight loss, abnormal 01/13/2020    Family History  Problem Relation Age of Onset   Healthy Mother    Healthy Father     Social History   Socioeconomic History   Marital status: Single    Spouse name: Not on file   Number of children: Not on file   Years of education: Not on file   Highest education level: Not on file  Occupational History   Not on file  Tobacco Use   Smoking status: Never   Smokeless tobacco: Never  Vaping Use   Vaping status: Never Used  Substance and Sexual Activity   Alcohol use: Yes    Comment: 1/week   Drug use: Not Currently    Types: Marijuana   Sexual activity: Yes  Partners: Male    Birth control/protection: None, Condom  Other Topics Concern   Not on file  Social History Narrative   Single.   Student at [employer], [employer].   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   Enjoys reading, spending time with friends.   Social Drivers of Health   Tobacco Use: Low Risk (05/03/2024)   Patient History    Smoking Tobacco Use: Never    Smokeless Tobacco Use: Never    Passive Exposure: Not on file  Financial Resource Strain: Not on file  Food Insecurity: Not on file  Transportation Needs: Not on file  Physical Activity: Not  on file  Stress: Not on file  Social Connections: Not on file  Intimate Partner Violence: Not on file  Depression (PHQ2-9): Low Risk (01/31/2024)   Depression (PHQ2-9)    PHQ-2 Score: 0  Alcohol Screen: Not on file  Housing: Not on file  Utilities: Not on file  Health Literacy: Not on file    Past Medical History, Surgical history, Social history, and Family history were reviewed and updated as appropriate.   Please see review of systems for further details on the patient's review from today.   Objective:   Physical Exam:  There were no vitals taken for this visit.  Physical Exam  Lab Review:     Component Value Date/Time   NA 139 05/03/2024 2127   K 3.6 05/03/2024 2127   CL 106 05/03/2024 2127   CO2 22 05/03/2024 2127   GLUCOSE 104 (H) 05/03/2024 2127   BUN 13 05/03/2024 2127   CREATININE 0.90 05/03/2024 2127   CALCIUM 9.7 05/03/2024 2127   PROT 8.2 (H) 05/03/2024 2127   ALBUMIN 5.0 05/03/2024 2127   AST 21 05/03/2024 2127   ALT 15 05/03/2024 2127   ALKPHOS 42 05/03/2024 2127   BILITOT 0.7 05/03/2024 2127   GFRNONAA >60 05/03/2024 2127   GFRAA >60 05/19/2020 1750       Component Value Date/Time   WBC 7.7 05/03/2024 2127   RBC 4.28 05/03/2024 2127   HGB 13.0 05/03/2024 2127   HCT 38.6 05/03/2024 2127   PLT 239 05/03/2024 2127   MCV 90.2 05/03/2024 2127   MCH 30.4 05/03/2024 2127   MCHC 33.7 05/03/2024 2127   RDW 12.6 05/03/2024 2127   LYMPHSABS 1.1 08/20/2022 1506   MONOABS 0.3 08/20/2022 1506   EOSABS 0.2 08/20/2022 1506   BASOSABS 0.0 08/20/2022 1506    No results found for: POCLITH, LITHIUM   No results found for: PHENYTOIN, PHENOBARB, VALPROATE, CBMZ   .res Assessment: Plan:    There are no diagnoses linked to this encounter.   Please see After Visit Summary for patient specific instructions.  Future Appointments  Date Time Provider Department Center  10/23/2024 10:30 AM Leondro Coryell Nattalie, NP CP-CP None    No orders of  the defined types were placed in this encounter.     -------------------------------     [1]  Allergies Allergen Reactions   Other Hives    Stone fruit   Shellfish Allergy Hives   Egg Protein-Containing Drug Products Nausea And Vomiting

## 2024-10-23 NOTE — Addendum Note (Signed)
 Addended by: LAWERANCE TILLMAN SAILOR on: 10/23/2024 10:38 AM   Modules accepted: Level of Service

## 2024-10-23 NOTE — Progress Notes (Signed)
 Patient no show appointment. ? ?
